# Patient Record
Sex: Female | Born: 1950 | Race: White | Hispanic: No | Smoking: Former smoker
Health system: Southern US, Community
[De-identification: ages and names within clinical notes are randomized; demographics above are authoritative.]

## PROBLEM LIST (undated history)

## (undated) DIAGNOSIS — J302 Other seasonal allergic rhinitis: Secondary | ICD-10-CM

## (undated) DIAGNOSIS — Z5189 Encounter for other specified aftercare: Secondary | ICD-10-CM

## (undated) DIAGNOSIS — N189 Chronic kidney disease, unspecified: Secondary | ICD-10-CM

## (undated) DIAGNOSIS — R000 Tachycardia, unspecified: Secondary | ICD-10-CM

## (undated) DIAGNOSIS — I739 Peripheral vascular disease, unspecified: Secondary | ICD-10-CM

## (undated) DIAGNOSIS — E785 Hyperlipidemia, unspecified: Secondary | ICD-10-CM

## (undated) DIAGNOSIS — R600 Localized edema: Secondary | ICD-10-CM

## (undated) DIAGNOSIS — J189 Pneumonia, unspecified organism: Secondary | ICD-10-CM

## (undated) DIAGNOSIS — D649 Anemia, unspecified: Secondary | ICD-10-CM

## (undated) DIAGNOSIS — M1990 Unspecified osteoarthritis, unspecified site: Secondary | ICD-10-CM

## (undated) HISTORY — PX: ANKLE SURGERY: SHX546

## (undated) HISTORY — DX: Hyperlipidemia, unspecified: E78.5

## (undated) HISTORY — PX: TONSILLECTOMY: SUR1361

## (undated) HISTORY — DX: Encounter for other specified aftercare: Z51.89

## (undated) HISTORY — DX: Tachycardia, unspecified: R00.0

## (undated) HISTORY — PX: TUBAL LIGATION: SHX77

## (undated) HISTORY — PX: CHOLECYSTECTOMY: SHX55

## (undated) HISTORY — PX: CARPAL TUNNEL RELEASE: SHX101

## (undated) HISTORY — PX: VEIN LIGATION AND STRIPPING: SHX2653

## (undated) HISTORY — PX: APPENDECTOMY: SHX54

---

## 2004-06-27 ENCOUNTER — Ambulatory Visit: Payer: Self-pay | Admitting: Family Medicine

## 2004-12-08 ENCOUNTER — Emergency Department: Payer: Self-pay | Admitting: Emergency Medicine

## 2005-06-29 ENCOUNTER — Emergency Department: Payer: Self-pay | Admitting: Emergency Medicine

## 2006-10-16 ENCOUNTER — Emergency Department: Payer: Self-pay

## 2006-10-16 ENCOUNTER — Other Ambulatory Visit: Payer: Self-pay

## 2006-11-05 ENCOUNTER — Inpatient Hospital Stay: Payer: Self-pay | Admitting: Internal Medicine

## 2006-11-05 ENCOUNTER — Other Ambulatory Visit: Payer: Self-pay

## 2008-03-09 ENCOUNTER — Ambulatory Visit: Payer: Self-pay | Admitting: Specialist

## 2008-03-16 ENCOUNTER — Ambulatory Visit: Payer: Self-pay | Admitting: Specialist

## 2008-03-29 ENCOUNTER — Ambulatory Visit: Payer: Self-pay | Admitting: Gastroenterology

## 2008-05-29 ENCOUNTER — Ambulatory Visit: Payer: Self-pay | Admitting: Anesthesiology

## 2008-06-02 ENCOUNTER — Ambulatory Visit: Payer: Self-pay | Admitting: Anesthesiology

## 2008-06-04 ENCOUNTER — Ambulatory Visit: Payer: Self-pay | Admitting: Family Medicine

## 2008-07-07 ENCOUNTER — Ambulatory Visit: Payer: Self-pay | Admitting: Anesthesiology

## 2008-08-07 ENCOUNTER — Ambulatory Visit: Payer: Self-pay | Admitting: Anesthesiology

## 2008-08-23 ENCOUNTER — Ambulatory Visit: Payer: Self-pay | Admitting: Anesthesiology

## 2010-12-23 ENCOUNTER — Observation Stay: Payer: Self-pay | Admitting: Internal Medicine

## 2011-03-19 ENCOUNTER — Ambulatory Visit: Payer: Self-pay | Admitting: Family Medicine

## 2012-03-16 ENCOUNTER — Ambulatory Visit: Payer: Self-pay | Admitting: Anesthesiology

## 2012-04-08 ENCOUNTER — Ambulatory Visit: Payer: Self-pay | Admitting: Anesthesiology

## 2012-06-01 ENCOUNTER — Ambulatory Visit: Payer: Self-pay | Admitting: Anesthesiology

## 2012-07-15 ENCOUNTER — Ambulatory Visit: Payer: Self-pay | Admitting: Anesthesiology

## 2012-08-17 ENCOUNTER — Ambulatory Visit: Payer: Self-pay | Admitting: Anesthesiology

## 2012-09-15 ENCOUNTER — Ambulatory Visit: Payer: Self-pay | Admitting: Anesthesiology

## 2012-11-15 ENCOUNTER — Ambulatory Visit: Payer: Self-pay | Admitting: Anesthesiology

## 2013-02-15 ENCOUNTER — Ambulatory Visit: Payer: Self-pay | Admitting: Anesthesiology

## 2013-05-17 ENCOUNTER — Ambulatory Visit: Payer: Self-pay | Admitting: Anesthesiology

## 2013-07-11 ENCOUNTER — Emergency Department: Payer: Self-pay | Admitting: Emergency Medicine

## 2013-08-18 ENCOUNTER — Ambulatory Visit: Payer: Self-pay | Admitting: Anesthesiology

## 2013-09-13 ENCOUNTER — Ambulatory Visit: Payer: Self-pay | Admitting: Family Medicine

## 2013-09-19 ENCOUNTER — Ambulatory Visit: Payer: Self-pay

## 2013-09-30 ENCOUNTER — Ambulatory Visit: Payer: Self-pay | Admitting: Family Medicine

## 2013-10-26 ENCOUNTER — Emergency Department: Payer: Self-pay | Admitting: Emergency Medicine

## 2013-10-26 LAB — URINALYSIS, COMPLETE
BILIRUBIN, UR: NEGATIVE
Bacteria: NONE SEEN
Glucose,UR: NEGATIVE mg/dL (ref 0–75)
Hyaline Cast: 3
Ketone: NEGATIVE
LEUKOCYTE ESTERASE: NEGATIVE
Nitrite: NEGATIVE
PH: 5 (ref 4.5–8.0)
Protein: NEGATIVE
RBC,UR: NONE SEEN /HPF (ref 0–5)
SPECIFIC GRAVITY: 1.006 (ref 1.003–1.030)
Squamous Epithelial: <1
WBC UR: NONE SEEN /HPF (ref 0–5)

## 2013-10-26 LAB — CBC WITH DIFFERENTIAL/PLATELET
BASOS PCT: 0.3 %
Basophil #: 0.1 10*3/uL (ref 0.0–0.1)
EOS ABS: 0.1 10*3/uL (ref 0.0–0.7)
Eosinophil %: 0.5 %
HCT: 39.6 % (ref 35.0–47.0)
HGB: 13.2 g/dL (ref 12.0–16.0)
Lymphocyte #: 2 10*3/uL (ref 1.0–3.6)
Lymphocyte %: 11.8 %
MCH: 30.5 pg (ref 26.0–34.0)
MCHC: 33.3 g/dL (ref 32.0–36.0)
MCV: 92 fL (ref 80–100)
Monocyte #: 0.9 x10 3/mm (ref 0.2–0.9)
Monocyte %: 5.2 %
NEUTROS PCT: 82.2 %
Neutrophil #: 14.2 10*3/uL — ABNORMAL HIGH (ref 1.4–6.5)
Platelet: 233 10*3/uL (ref 150–440)
RBC: 4.33 10*6/uL (ref 3.80–5.20)
RDW: 13.6 % (ref 11.5–14.5)
WBC: 17.2 10*3/uL — ABNORMAL HIGH (ref 3.6–11.0)

## 2013-10-26 LAB — COMPREHENSIVE METABOLIC PANEL
ALBUMIN: 3.6 g/dL (ref 3.4–5.0)
AST: 25 U/L (ref 15–37)
Alkaline Phosphatase: 78 U/L
Anion Gap: 1 — ABNORMAL LOW (ref 7–16)
BUN: 18 mg/dL (ref 7–18)
Bilirubin,Total: 0.5 mg/dL (ref 0.2–1.0)
CALCIUM: 9.4 mg/dL (ref 8.5–10.1)
CHLORIDE: 105 mmol/L (ref 98–107)
CO2: 31 mmol/L (ref 21–32)
CREATININE: 0.77 mg/dL (ref 0.60–1.30)
EGFR (Non-African Amer.): >60
Glucose: 144 mg/dL — ABNORMAL HIGH (ref 65–99)
Osmolality: 278 (ref 275–301)
POTASSIUM: 3.8 mmol/L (ref 3.5–5.1)
SGPT (ALT): 42 U/L (ref 12–78)
Sodium: 137 mmol/L (ref 136–145)
Total Protein: 6.8 g/dL (ref 6.4–8.2)

## 2013-10-26 LAB — PROTIME-INR
INR: 0.9
Prothrombin Time: 12.1 secs (ref 11.5–14.7)

## 2013-10-26 LAB — APTT: ACTIVATED PTT: 29.1 s (ref 23.6–35.9)

## 2013-11-06 DIAGNOSIS — J189 Pneumonia, unspecified organism: Secondary | ICD-10-CM

## 2013-11-06 HISTORY — DX: Pneumonia, unspecified organism: J18.9

## 2013-11-09 ENCOUNTER — Ambulatory Visit: Payer: Self-pay | Admitting: Anesthesiology

## 2013-11-11 ENCOUNTER — Ambulatory Visit: Payer: Self-pay | Admitting: Family Medicine

## 2013-11-21 ENCOUNTER — Ambulatory Visit: Payer: Self-pay | Admitting: Family Medicine

## 2013-11-22 ENCOUNTER — Inpatient Hospital Stay: Payer: Self-pay | Admitting: Internal Medicine

## 2013-11-22 LAB — HEMOGLOBIN A1C: Hemoglobin A1C: 6.1 % (ref 4.2–6.3)

## 2013-11-22 LAB — CK TOTAL AND CKMB (NOT AT ARMC)
CK, Total: 151 U/L
CK, Total: 147 U/L
CK-MB: 5.7 ng/mL — ABNORMAL HIGH (ref 0.5–3.6)
CK-MB: 5.9 ng/mL — AB (ref 0.5–3.6)

## 2013-11-22 LAB — CK-MB: CK-MB: 5.5 ng/mL — ABNORMAL HIGH (ref 0.5–3.6)

## 2013-11-22 LAB — TROPONIN I
TROPONIN-I: 0.58 ng/mL — AB
Troponin-I: 0.83 ng/mL — ABNORMAL HIGH

## 2013-11-23 LAB — CBC WITH DIFFERENTIAL/PLATELET
BASOS PCT: 0.2 %
Basophil #: 0 10*3/uL (ref 0.0–0.1)
Eosinophil #: 0 10*3/uL (ref 0.0–0.7)
Eosinophil %: 0 %
HCT: 37.5 % (ref 35.0–47.0)
HGB: 12.6 g/dL (ref 12.0–16.0)
LYMPHS ABS: 0.9 10*3/uL — AB (ref 1.0–3.6)
Lymphocyte %: 4.8 %
MCH: 31.3 pg (ref 26.0–34.0)
MCHC: 33.7 g/dL (ref 32.0–36.0)
MCV: 93 fL (ref 80–100)
MONO ABS: 0.6 x10 3/mm (ref 0.2–0.9)
Monocyte %: 3 %
Neutrophil #: 17.3 10*3/uL — ABNORMAL HIGH (ref 1.4–6.5)
Neutrophil %: 92 %
Platelet: 297 10*3/uL (ref 150–440)
RBC: 4.03 10*6/uL (ref 3.80–5.20)
RDW: 15.1 % — ABNORMAL HIGH (ref 11.5–14.5)
WBC: 18.8 10*3/uL — ABNORMAL HIGH (ref 3.6–11.0)

## 2013-11-23 LAB — BASIC METABOLIC PANEL
Anion Gap: 7 (ref 7–16)
BUN: 22 mg/dL — ABNORMAL HIGH (ref 7–18)
CALCIUM: 8.6 mg/dL (ref 8.5–10.1)
CREATININE: 1.52 mg/dL — AB (ref 0.60–1.30)
Chloride: 105 mmol/L (ref 98–107)
Co2: 23 mmol/L (ref 21–32)
EGFR (African American): 42 — ABNORMAL LOW
GFR CALC NON AF AMER: 36 — AB
Glucose: 188 mg/dL — ABNORMAL HIGH (ref 65–99)
Osmolality: 278 (ref 275–301)
POTASSIUM: 4.2 mmol/L (ref 3.5–5.1)
SODIUM: 135 mmol/L — AB (ref 136–145)

## 2013-11-23 LAB — MAGNESIUM: Magnesium: 2 mg/dL

## 2013-11-23 LAB — TSH: Thyroid Stimulating Horm: 0.402 u[IU]/mL — ABNORMAL LOW

## 2013-11-23 LAB — PHOSPHORUS: PHOSPHORUS: 3.5 mg/dL (ref 2.5–4.9)

## 2013-11-24 LAB — BASIC METABOLIC PANEL
ANION GAP: 4 — AB (ref 7–16)
BUN: 31 mg/dL — ABNORMAL HIGH (ref 7–18)
CALCIUM: 8.8 mg/dL (ref 8.5–10.1)
Chloride: 105 mmol/L (ref 98–107)
Co2: 27 mmol/L (ref 21–32)
Creatinine: 1.03 mg/dL (ref 0.60–1.30)
EGFR (African American): >60
GFR CALC NON AF AMER: 57 — AB
Glucose: 192 mg/dL — ABNORMAL HIGH (ref 65–99)
Osmolality: 284 (ref 275–301)
POTASSIUM: 4.2 mmol/L (ref 3.5–5.1)
Sodium: 136 mmol/L (ref 136–145)

## 2013-11-24 LAB — CBC WITH DIFFERENTIAL/PLATELET
Basophil #: 0 10*3/uL (ref 0.0–0.1)
Basophil %: 0.3 %
EOS ABS: 0 10*3/uL (ref 0.0–0.7)
EOS PCT: 0 %
HCT: 35.3 % (ref 35.0–47.0)
HGB: 11.7 g/dL — ABNORMAL LOW (ref 12.0–16.0)
Lymphocyte #: 0.9 10*3/uL — ABNORMAL LOW (ref 1.0–3.6)
Lymphocyte %: 5.7 %
MCH: 30.9 pg (ref 26.0–34.0)
MCHC: 33.2 g/dL (ref 32.0–36.0)
MCV: 93 fL (ref 80–100)
MONOS PCT: 6.1 %
Monocyte #: 1 x10 3/mm — ABNORMAL HIGH (ref 0.2–0.9)
NEUTROS ABS: 13.8 10*3/uL — AB (ref 1.4–6.5)
NEUTROS PCT: 87.9 %
Platelet: 232 10*3/uL (ref 150–440)
RBC: 3.79 10*6/uL — AB (ref 3.80–5.20)
RDW: 15.5 % — ABNORMAL HIGH (ref 11.5–14.5)
WBC: 15.7 10*3/uL — AB (ref 3.6–11.0)

## 2013-11-25 LAB — BASIC METABOLIC PANEL
Anion Gap: 4 — ABNORMAL LOW (ref 7–16)
Anion Gap: 1 — ABNORMAL LOW (ref 7–16)
BUN: 43 mg/dL — ABNORMAL HIGH (ref 7–18)
BUN: 39 mg/dL — ABNORMAL HIGH (ref 7–18)
CHLORIDE: 109 mmol/L — AB (ref 98–107)
CO2: 28 mmol/L (ref 21–32)
Calcium, Total: 8.5 mg/dL (ref 8.5–10.1)
Calcium, Total: 8.4 mg/dL — ABNORMAL LOW (ref 8.5–10.1)
Chloride: 108 mmol/L — ABNORMAL HIGH (ref 98–107)
Co2: 28 mmol/L (ref 21–32)
Creatinine: 1.08 mg/dL (ref 0.60–1.30)
Creatinine: 0.98 mg/dL (ref 0.60–1.30)
EGFR (African American): >60
EGFR (Non-African Amer.): 54 — ABNORMAL LOW
Glucose: 155 mg/dL — ABNORMAL HIGH (ref 65–99)
Glucose: 187 mg/dL — ABNORMAL HIGH (ref 65–99)
OSMOLALITY: 293 (ref 275–301)
Osmolality: 290 (ref 275–301)
Potassium: 5.5 mmol/L — ABNORMAL HIGH (ref 3.5–5.1)
Potassium: 5.1 mmol/L (ref 3.5–5.1)
Sodium: 140 mmol/L (ref 136–145)
Sodium: 138 mmol/L (ref 136–145)

## 2013-11-25 LAB — MAGNESIUM: MAGNESIUM: 2.5 mg/dL — AB

## 2013-11-25 LAB — PHOSPHORUS: Phosphorus: 5.1 mg/dL — ABNORMAL HIGH (ref 2.5–4.9)

## 2013-11-26 LAB — CBC WITH DIFFERENTIAL/PLATELET
Bands: 2 %
HCT: 38 % (ref 35.0–47.0)
HGB: 12.4 g/dL (ref 12.0–16.0)
Lymphocytes: 8 %
MCH: 30.5 pg (ref 26.0–34.0)
MCHC: 32.6 g/dL (ref 32.0–36.0)
MCV: 93 fL (ref 80–100)
MONOS PCT: 5 %
Metamyelocyte: 2 %
Myelocyte: 2 %
Platelet: 222 10*3/uL (ref 150–440)
RBC: 4.07 10*6/uL (ref 3.80–5.20)
RDW: 14.8 % — AB (ref 11.5–14.5)
SEGMENTED NEUTROPHILS: 77 %
Variant Lymphocyte - H1-Rlymph: 4 %
WBC: 13.8 10*3/uL — ABNORMAL HIGH (ref 3.6–11.0)

## 2013-11-26 LAB — BASIC METABOLIC PANEL
ANION GAP: 1 — AB (ref 7–16)
BUN: 36 mg/dL — ABNORMAL HIGH (ref 7–18)
Calcium, Total: 8.5 mg/dL (ref 8.5–10.1)
Chloride: 107 mmol/L (ref 98–107)
Co2: 31 mmol/L (ref 21–32)
Creatinine: 0.72 mg/dL (ref 0.60–1.30)
EGFR (African American): >60
EGFR (Non-African Amer.): >60
GLUCOSE: 193 mg/dL — AB (ref 65–99)
Osmolality: 291 (ref 275–301)
POTASSIUM: 5.1 mmol/L (ref 3.5–5.1)
Sodium: 139 mmol/L (ref 136–145)

## 2013-11-26 LAB — MAGNESIUM: Magnesium: 2.1 mg/dL

## 2013-11-26 LAB — PHOSPHORUS: PHOSPHORUS: 2.9 mg/dL (ref 2.5–4.9)

## 2013-11-27 LAB — BASIC METABOLIC PANEL
Anion Gap: 5 — ABNORMAL LOW (ref 7–16)
BUN: 25 mg/dL — ABNORMAL HIGH (ref 7–18)
Calcium, Total: 8.1 mg/dL — ABNORMAL LOW (ref 8.5–10.1)
Chloride: 99 mmol/L (ref 98–107)
Co2: 31 mmol/L (ref 21–32)
Creatinine: 0.75 mg/dL (ref 0.60–1.30)
EGFR (African American): >60
EGFR (Non-African Amer.): >60
GLUCOSE: 184 mg/dL — AB (ref 65–99)
Osmolality: 279 (ref 275–301)
Potassium: 4.8 mmol/L (ref 3.5–5.1)
Sodium: 135 mmol/L — ABNORMAL LOW (ref 136–145)

## 2013-11-27 LAB — URINE CULTURE

## 2013-11-27 LAB — CBC WITH DIFFERENTIAL/PLATELET
BASOS ABS: 0.1 10*3/uL (ref 0.0–0.1)
BASOS PCT: 0.7 %
EOS ABS: 0 10*3/uL (ref 0.0–0.7)
Eosinophil %: 0 %
HCT: 36.7 % (ref 35.0–47.0)
HGB: 12.2 g/dL (ref 12.0–16.0)
LYMPHS PCT: 9.1 %
Lymphocyte #: 1.5 10*3/uL (ref 1.0–3.6)
MCH: 30.8 pg (ref 26.0–34.0)
MCHC: 33.1 g/dL (ref 32.0–36.0)
MCV: 93 fL (ref 80–100)
Monocyte #: 1.5 x10 3/mm — ABNORMAL HIGH (ref 0.2–0.9)
Monocyte %: 9.1 %
Neutrophil #: 13.1 10*3/uL — ABNORMAL HIGH (ref 1.4–6.5)
Neutrophil %: 81.1 %
Platelet: 192 10*3/uL (ref 150–440)
RBC: 3.95 10*6/uL (ref 3.80–5.20)
RDW: 14.8 % — ABNORMAL HIGH (ref 11.5–14.5)
WBC: 16.1 10*3/uL — AB (ref 3.6–11.0)

## 2013-11-27 LAB — CULTURE, BLOOD (SINGLE)

## 2013-11-27 LAB — MAGNESIUM: MAGNESIUM: 1.7 mg/dL — AB

## 2013-11-27 LAB — EXPECTORATED SPUTUM ASSESSMENT W GRAM STAIN, RFLX TO RESP C

## 2013-11-27 LAB — PHOSPHORUS: Phosphorus: 3.1 mg/dL (ref 2.5–4.9)

## 2013-11-28 LAB — CBC WITH DIFFERENTIAL/PLATELET
BANDS NEUTROPHIL: 1 %
Eosinophil: 1 %
HCT: 38.1 % (ref 35.0–47.0)
HGB: 12.4 g/dL (ref 12.0–16.0)
Lymphocytes: 6 %
MCH: 30.3 pg (ref 26.0–34.0)
MCHC: 32.6 g/dL (ref 32.0–36.0)
MCV: 93 fL (ref 80–100)
METAMYELOCYTE: 4 %
Monocytes: 7 %
Myelocyte: 1 %
Platelet: 187 10*3/uL (ref 150–440)
RBC: 4.1 10*6/uL (ref 3.80–5.20)
RDW: 14.9 % — ABNORMAL HIGH (ref 11.5–14.5)
Segmented Neutrophils: 80 %
WBC: 21.1 10*3/uL — AB (ref 3.6–11.0)

## 2013-11-28 LAB — BASIC METABOLIC PANEL
ANION GAP: 0 — AB (ref 7–16)
BUN: 26 mg/dL — AB (ref 7–18)
CALCIUM: 7.9 mg/dL — AB (ref 8.5–10.1)
CREATININE: 0.62 mg/dL (ref 0.60–1.30)
Chloride: 99 mmol/L (ref 98–107)
Co2: 33 mmol/L — ABNORMAL HIGH (ref 21–32)
EGFR (African American): >60
GLUCOSE: 180 mg/dL — AB (ref 65–99)
OSMOLALITY: 274 (ref 275–301)
POTASSIUM: 4.4 mmol/L (ref 3.5–5.1)
SODIUM: 132 mmol/L — AB (ref 136–145)

## 2013-11-29 LAB — PHOSPHORUS: Phosphorus: 2.8 mg/dL (ref 2.5–4.9)

## 2013-11-29 LAB — BASIC METABOLIC PANEL
Anion Gap: 2 — ABNORMAL LOW (ref 7–16)
BUN: 24 mg/dL — AB (ref 7–18)
CHLORIDE: 99 mmol/L (ref 98–107)
CO2: 35 mmol/L — AB (ref 21–32)
Calcium, Total: 7.9 mg/dL — ABNORMAL LOW (ref 8.5–10.1)
Creatinine: 0.54 mg/dL — ABNORMAL LOW (ref 0.60–1.30)
GLUCOSE: 83 mg/dL (ref 65–99)
OSMOLALITY: 275 (ref 275–301)
POTASSIUM: 3.4 mmol/L — AB (ref 3.5–5.1)
Sodium: 136 mmol/L (ref 136–145)

## 2013-11-29 LAB — MAGNESIUM: Magnesium: 1.8 mg/dL

## 2013-11-29 LAB — ALBUMIN: Albumin: 2.3 g/dL — ABNORMAL LOW (ref 3.4–5.0)

## 2013-11-29 LAB — CLOSTRIDIUM DIFFICILE(ARMC)

## 2013-11-30 LAB — BASIC METABOLIC PANEL
Anion Gap: 4 — ABNORMAL LOW (ref 7–16)
BUN: 19 mg/dL — ABNORMAL HIGH (ref 7–18)
Calcium, Total: 8.1 mg/dL — ABNORMAL LOW (ref 8.5–10.1)
Chloride: 99 mmol/L (ref 98–107)
Co2: 36 mmol/L — ABNORMAL HIGH (ref 21–32)
Creatinine: 0.53 mg/dL — ABNORMAL LOW (ref 0.60–1.30)
EGFR (African American): >60
EGFR (Non-African Amer.): >60
GLUCOSE: 85 mg/dL (ref 65–99)
Osmolality: 279 (ref 275–301)
POTASSIUM: 3.2 mmol/L — AB (ref 3.5–5.1)
Sodium: 139 mmol/L (ref 136–145)

## 2013-12-01 LAB — BASIC METABOLIC PANEL
Anion Gap: 6 — ABNORMAL LOW (ref 7–16)
BUN: 17 mg/dL (ref 7–18)
CALCIUM: 8.2 mg/dL — AB (ref 8.5–10.1)
Chloride: 102 mmol/L (ref 98–107)
Co2: 34 mmol/L — ABNORMAL HIGH (ref 21–32)
Creatinine: 0.54 mg/dL — ABNORMAL LOW (ref 0.60–1.30)
EGFR (Non-African Amer.): >60
GLUCOSE: 84 mg/dL (ref 65–99)
Osmolality: 284 (ref 275–301)
Sodium: 142 mmol/L (ref 136–145)

## 2013-12-01 LAB — POTASSIUM: POTASSIUM: 3.2 mmol/L — AB (ref 3.5–5.1)

## 2013-12-02 LAB — PLATELET COUNT: Platelet: 165 10*3/uL (ref 150–440)

## 2013-12-02 LAB — BASIC METABOLIC PANEL
ANION GAP: 5 — AB (ref 7–16)
BUN: 17 mg/dL (ref 7–18)
CHLORIDE: 103 mmol/L (ref 98–107)
Calcium, Total: 8.7 mg/dL (ref 8.5–10.1)
Co2: 31 mmol/L (ref 21–32)
Creatinine: 0.59 mg/dL — ABNORMAL LOW (ref 0.60–1.30)
EGFR (African American): >60
EGFR (Non-African Amer.): >60
Glucose: 63 mg/dL — ABNORMAL LOW (ref 65–99)
Osmolality: 277 (ref 275–301)
Potassium: 3.7 mmol/L (ref 3.5–5.1)
SODIUM: 139 mmol/L (ref 136–145)

## 2013-12-02 LAB — CULTURE, BLOOD (SINGLE)

## 2014-01-05 ENCOUNTER — Ambulatory Visit: Payer: Self-pay | Admitting: Anesthesiology

## 2014-03-15 ENCOUNTER — Ambulatory Visit: Payer: Self-pay | Admitting: Anesthesiology

## 2014-04-14 ENCOUNTER — Ambulatory Visit: Payer: Self-pay | Admitting: Anesthesiology

## 2014-05-16 ENCOUNTER — Ambulatory Visit: Payer: Self-pay | Admitting: Anesthesiology

## 2014-06-09 IMAGING — CR DG CHEST 1V PORT
1 series · 1 of 1 positions shown · non-contrast
Comparison: 11/24/2013

CLINICAL DATA: Hypoxia, chronic cough

EXAM:
PORTABLE CHEST - 1 VIEW

[ap]
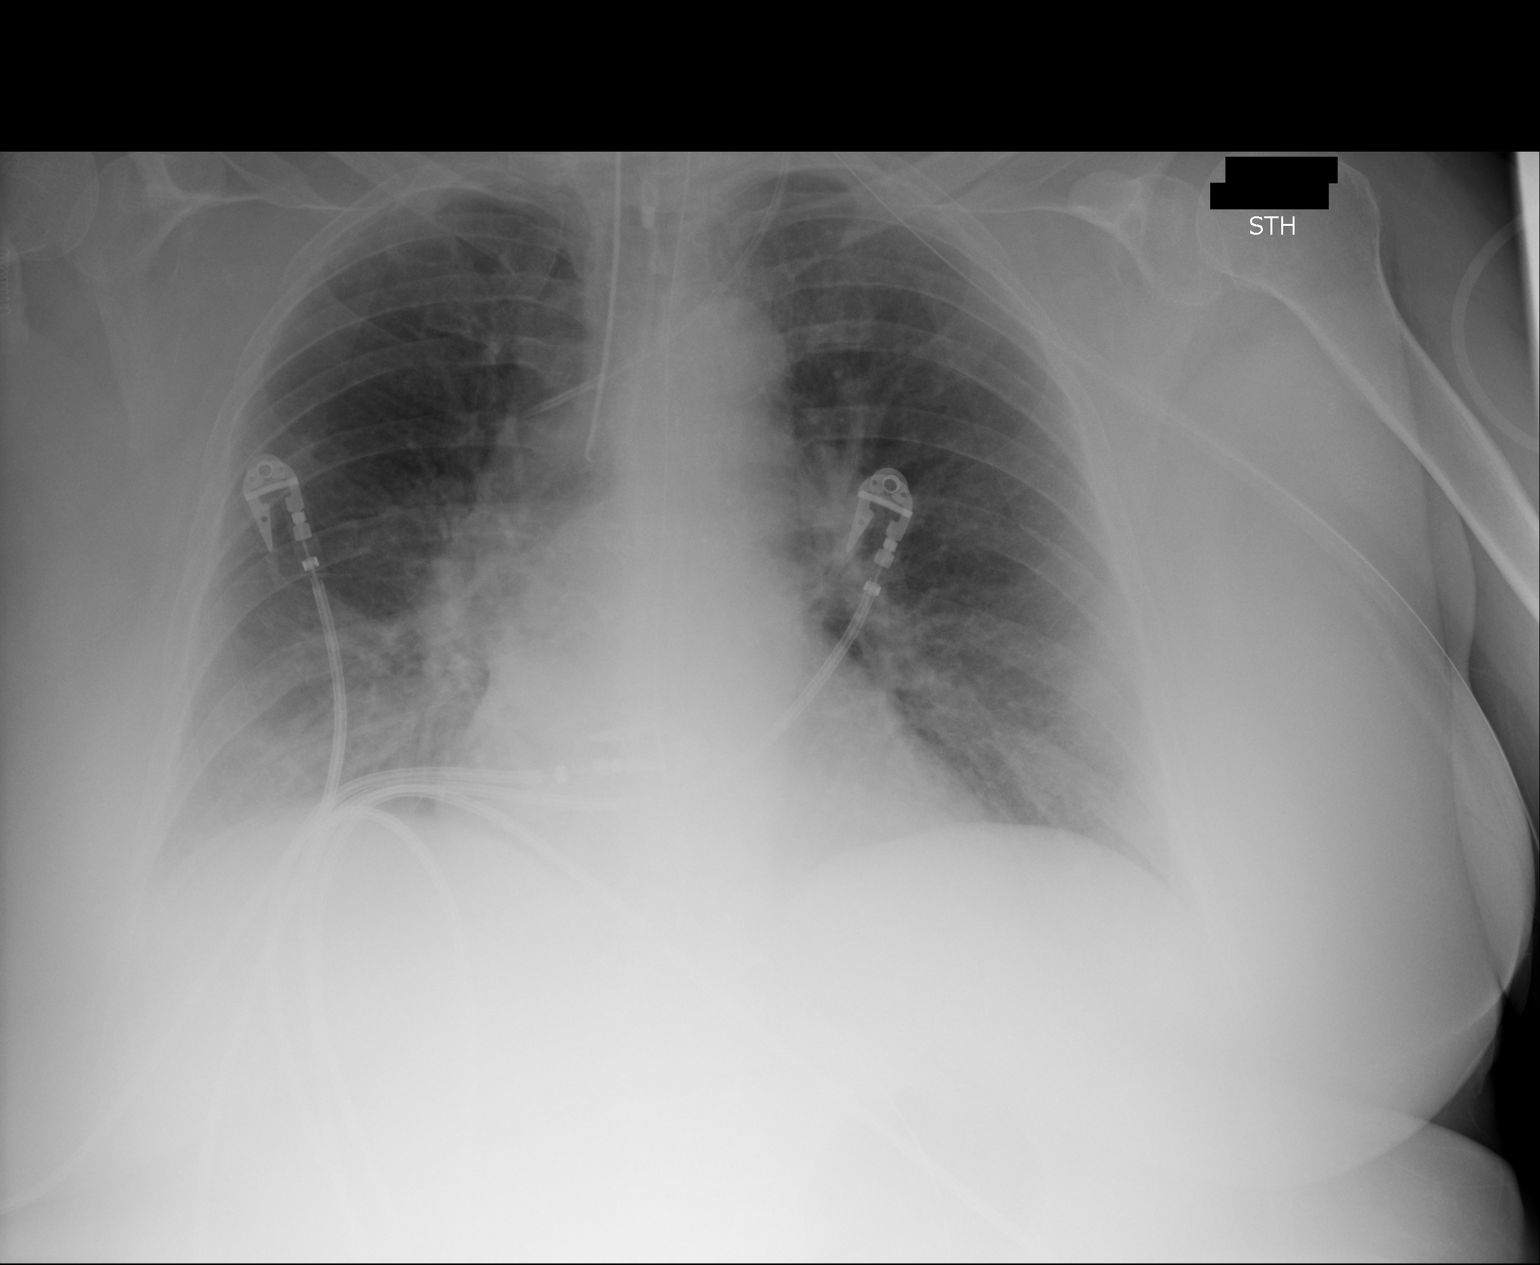

[1 of 1 positions shown; findings below may reference images not displayed]

FINDINGS: Endotracheal tube terminates 8 mm above the carina with preferential
intubation of the right mainstem bronchus.

Low lung volumes with vascular crowding. Possible mild patchy right
lower lobe opacity, atelectasis versus pneumonia. No pleural
effusion or pneumothorax.

The heart is normal in size.

Stable left IJ venous catheter in the mid SVC.

Enteric tube courses into the stomach.
IMPRESSION: Endotracheal tube terminates 8 mm above the carina with preferential
intubation of the right mainstem bronchus.

Possible mild patchy right lower lobe opacity, atelectasis versus
pneumonia.

## 2014-06-12 IMAGING — CR DG ABDOMEN 1V
1 series · 2 of 2 positions shown · non-contrast
Comparison: CT ABD-PELV W/ CM dated 11/11/2013

CLINICAL DATA: Nausea and vomiting and diarrhea ; question small
bowel obstruction.

EXAM:
ABDOMEN - 1 VIEW

[Series 1: supine kub · 0.17mm/px · 2 of 2 slices shown]
[im 1/2]
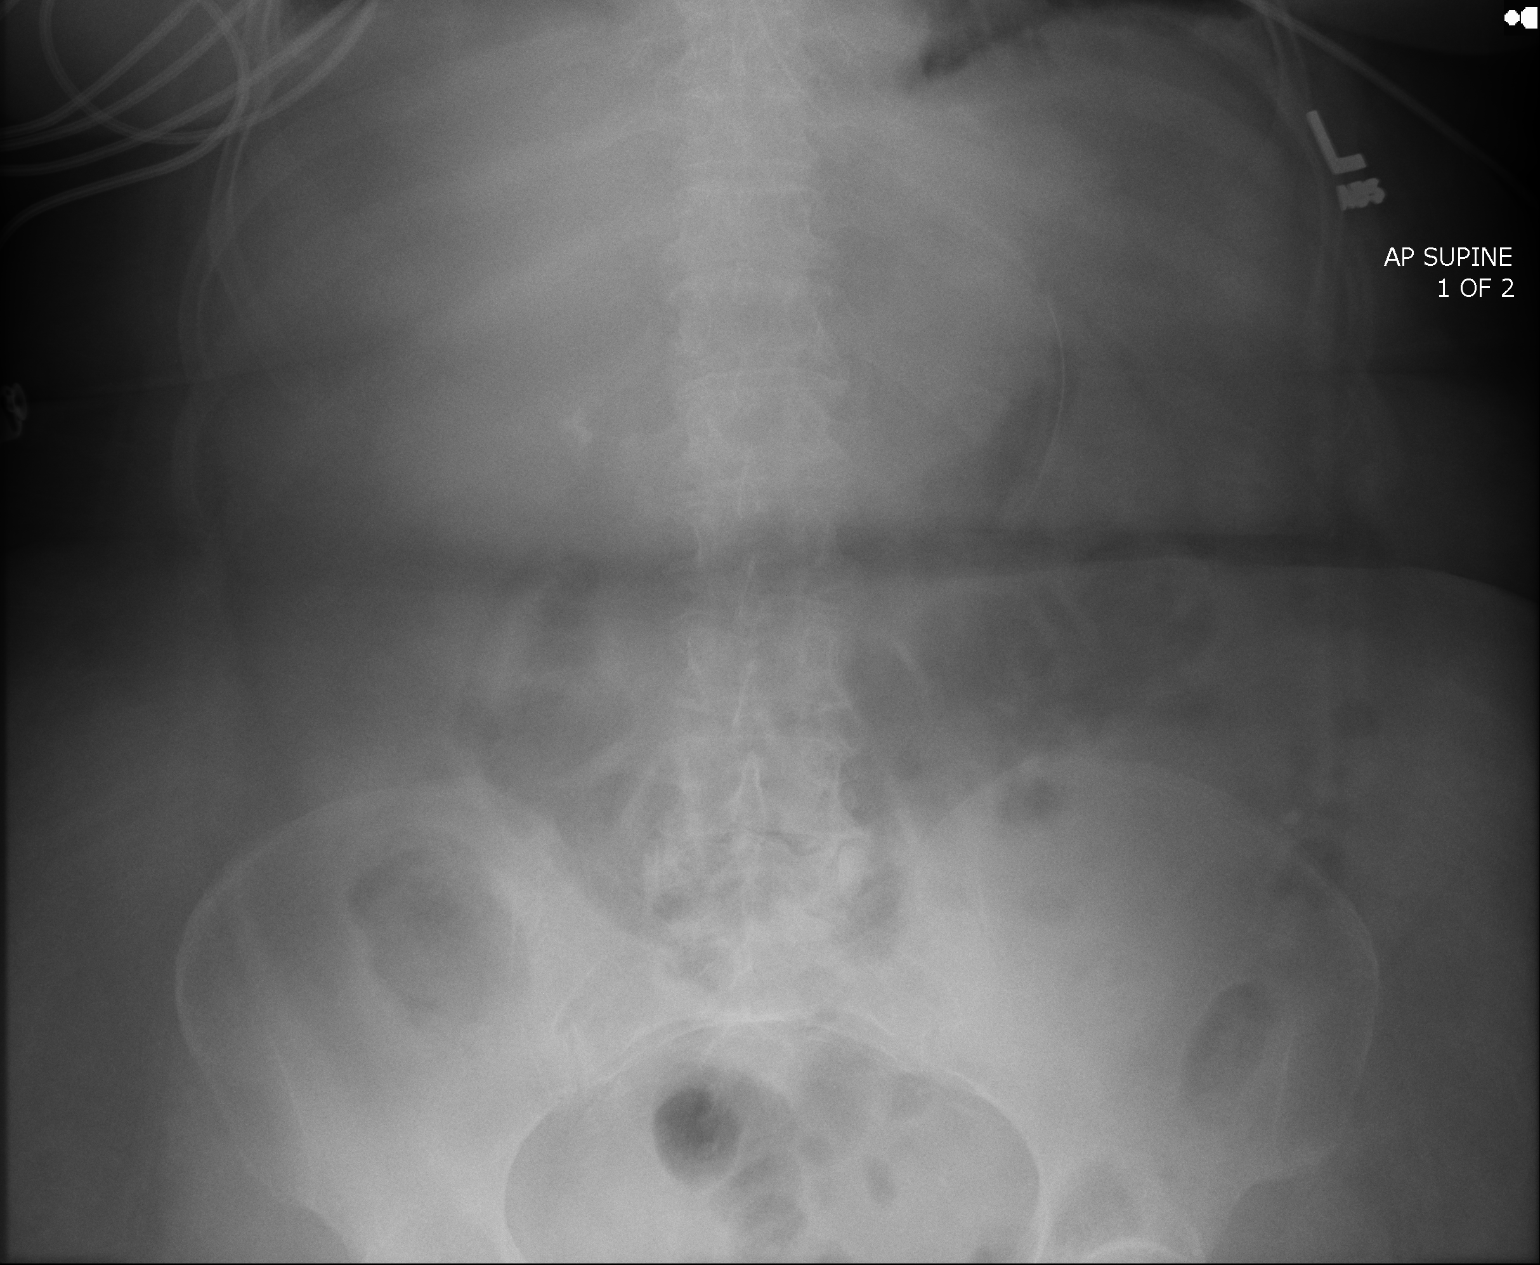
[im 2/2]
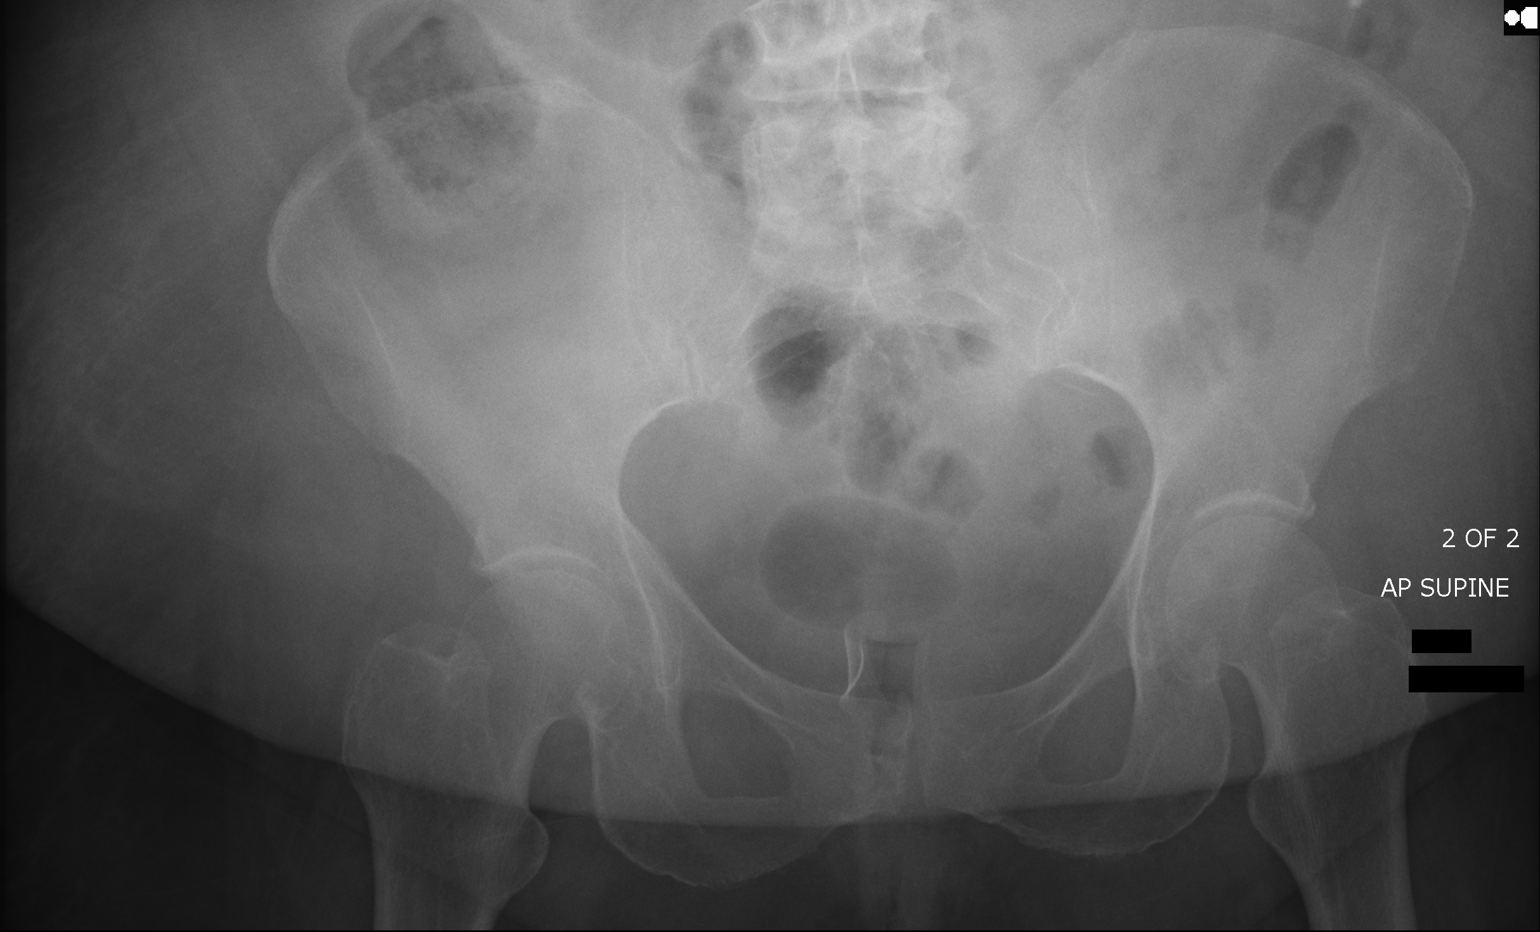

[2 of 2 positions shown; findings below may reference images not displayed]

FINDINGS: The bowel gas pattern is nonspecific. There is a loop of mildly
distended gas-filled bowel over the mid abdomen. There is gas in the
descending colon and rectosigmoid. The esophagogastric tube tip and
proximal port lie well below the expected location of the GE
junction. There are surgical clips in the gallbladder fossa. There
are degenerative changes of the lower lumbar spine.
IMPRESSION: The bowel gas pattern is nonspecific. There is no definite evidence
of obstruction

## 2014-08-09 ENCOUNTER — Ambulatory Visit: Payer: Self-pay | Admitting: Anesthesiology

## 2014-09-27 ENCOUNTER — Emergency Department: Payer: Self-pay | Admitting: Emergency Medicine

## 2014-09-27 LAB — CBC WITH DIFFERENTIAL/PLATELET
BASOS ABS: 0.1 10*3/uL (ref 0.0–0.1)
Basophil %: 0.7 %
Eosinophil #: 0.2 10*3/uL (ref 0.0–0.7)
Eosinophil %: 1.8 %
HCT: 51 % — AB (ref 35.0–47.0)
HGB: 16.5 g/dL — ABNORMAL HIGH (ref 12.0–16.0)
LYMPHS ABS: 1.9 10*3/uL (ref 1.0–3.6)
Lymphocyte %: 14.1 %
MCH: 30.1 pg (ref 26.0–34.0)
MCHC: 32.3 g/dL (ref 32.0–36.0)
MCV: 93 fL (ref 80–100)
MONO ABS: 0.7 x10 3/mm (ref 0.2–0.9)
MONOS PCT: 5 %
Neutrophil #: 10.6 10*3/uL — ABNORMAL HIGH (ref 1.4–6.5)
Neutrophil %: 78.4 %
Platelet: 259 10*3/uL (ref 150–440)
RBC: 5.46 10*6/uL — ABNORMAL HIGH (ref 3.80–5.20)
RDW: 13.5 % (ref 11.5–14.5)
WBC: 13.5 10*3/uL — ABNORMAL HIGH (ref 3.6–11.0)

## 2014-09-27 LAB — URINALYSIS, COMPLETE
Bacteria: NONE SEEN
GLUCOSE, UR: NEGATIVE mg/dL (ref 0–75)
KETONE: NEGATIVE
Leukocyte Esterase: NEGATIVE
Nitrite: NEGATIVE
PH: 5 (ref 4.5–8.0)
Protein: 100
RBC,UR: 11 /HPF (ref 0–5)
Specific Gravity: 1.025 (ref 1.003–1.030)
Squamous Epithelial: 4
WBC UR: 6 /HPF (ref 0–5)

## 2014-09-27 LAB — COMPREHENSIVE METABOLIC PANEL
ANION GAP: 8 (ref 7–16)
Albumin: 3.5 g/dL (ref 3.4–5.0)
Alkaline Phosphatase: 91 U/L
BUN: 14 mg/dL (ref 7–18)
Bilirubin,Total: 0.4 mg/dL (ref 0.2–1.0)
CREATININE: 0.9 mg/dL (ref 0.60–1.30)
Calcium, Total: 9.1 mg/dL (ref 8.5–10.1)
Chloride: 106 mmol/L (ref 98–107)
Co2: 26 mmol/L (ref 21–32)
EGFR (Non-African Amer.): >60
Glucose: 150 mg/dL — ABNORMAL HIGH (ref 65–99)
Osmolality: 283 (ref 275–301)
Potassium: 4.6 mmol/L (ref 3.5–5.1)
SGOT(AST): 59 U/L — ABNORMAL HIGH (ref 15–37)
SGPT (ALT): 48 U/L
SODIUM: 140 mmol/L (ref 136–145)
Total Protein: 7.6 g/dL (ref 6.4–8.2)

## 2014-09-27 LAB — TROPONIN I

## 2014-10-09 ENCOUNTER — Ambulatory Visit: Payer: Self-pay | Admitting: Anesthesiology

## 2014-12-27 ENCOUNTER — Ambulatory Visit
Admit: 2014-12-27 | Disposition: A | Discharge status: Home / Self Care | Payer: Self-pay | Attending: Anesthesiology | Admitting: Anesthesiology

## 2014-12-30 NOTE — Consult Note (Signed)
PATIENT NAME:  Madison [name], Madison [name] MR#:  [id number] DATE OF BIRTH:  [birth year]  DATE OF CONSULTATION:  10/26/2013  CONSULTING PHYSICIAN:  Cristal Deer A. Barron Vanloan, MD  REASON FOR CONSULTATION: Left flank hematoma and subcu bruising.   HISTORY OF PRESENT ILLNESS: Madison [name] is a pleasant 64 year old female who has had been treated for the last month and a half for various pulmonary issues which resulted in a frequent and sometimes strong cough. She says that she has been on multiple rounds of antibiotics and has been on prednisone recently.   According to her, approximately 4 days ago, she was coughing violently and pinched her abdominal wall to assist with her coughing and noticed bruising at her pinching point. She also developed significant ecchymoses and also a knot on her left flank. It is tender but bearable. She thought it was actually getting larger. She presented to the ED today. Otherwise has been doing fine except for the cough. Does not have any skin redness of the wound or warmth and no skin changes. No fevers, chills, night sweats, shortness of breath, chest pain, abdominal pain except for the soft-tissue pain, nausea, vomiting, diarrhea, constipation, dysuria or hematuria.   PAST MEDICAL HISTORY:  1.  ? COPD. 2.  History of osteopenia, osteoporosis.  3.  Peripheral vascular disease.  4.  Nephrolithiasis.  5.  Chronic back pain for which she sees Dr. Pernell Dupre.  6.  Allergic rhinitis.  7.  Hypercholesterolemia.  8.  Ovarian cysts.  9.  History of vein stripping with ligation.  10.  History of CTR, left.  11.  History of navicular removal.  12.  History of tubal ligation.  13.  History of tonsillectomy and adenoidectomy. 14.  History of cholecystectomy.   HOME MEDICATIONS: Vitamin B12, potassium chloride, pantaday ophthalmologic solution, naproxen, methylprednisone, methocarbamol, loratadine, furosemide, fluticasone, duloxetine, doxycycline, dextromethorphan, Keflex,benzonatate,  atorvastatin, albuterol, ibuprofen, and Norco for back pain.   ALLERGIES: ALBUMIN, EGGS, FLEXERIL, SULFA, PENICILLIN AND MORPHINE.   SOCIAL HISTORY: Lives in [place] with her 2 daughters who are CNAs, is an Charity fundraiser. History of tobacco use which she quit on January 5. No active alcohol or drug use.   FAMILY HISTORY: Unremarkable.   REVIEW OF SYSTEMS: A 12-point review of systems is obtained. Pertinent positives and negatives as above.   PHYSICAL EXAMINATION:  VITAL SIGNS: Temperature 97.9, pulse 77, blood pressure 155/71, respirations 20, 94% on room air.  GENERAL: No acute distress, alert and oriented x3. HEAD: Normocephalic, atraumatic. Pupils equal and react to light. No scleral icterus. No conjunctivitis. Face: No obvious facial trauma. Normal external nose. Normal external ears.  CHEST: Lungs clear to auscultation. Moving air well. HEART: Regular rate and rhythm. No murmurs, rubs, or gallops.  ABDOMEN: Soft, large ecchymotic bruise over lower abdomen and a palpable subcu mass which is deep on her left side. No obvious skin breakdown. No erythema. No induration.  EXTREMITIES: Moves all extremities well. Strength 5/5.  NEUROLOGIC: Cranial nerves II through XII grossly intact. Sensation intact in all four extremities.   LABORATORY DATA: White cell count of 17.2, hemoglobin 13.2, hematocrit 39.6, platelets at 233. INR 0.9. Urinalysis negative. CMP negative.   CT scan shows abdominal wall soft-tissue stranding as well as a 9 x 5 x 6 hematoma involving the left abdominal wall, appeared to be intramuscular.   ASSESSMENT AND PLAN: Madison [name] is a pleasant 64 year old who appears to have an abdominal hematoma and soft-tissue ecchymoses following a severe coughing spell, likely consistent with violent coughing. No  active extravasation, is hemodynamically stable. Hemoglobin is stable.   I have spoken with her and have offered admission if she is concerned about its increasing size. Otherwise, she  is very reliable and lives with her 2 daughters who are also in the nursing field.   She is to return to office next week for followup. She is to refrain from naproxen but to substitute with Norco for pain for which she is to call Dr. Jarvis MorganAdam's office and let him know; in addition, she is to call hospital and contact me personally or call or return to ER if it feels like that pain is worsening or there are signs or symptoms of infection or skin breakdown or enlarging of hematoma. I spoke with Dr. York CeriseForbach regarding this plan and the patient agrees with this plan.   ____________________________ Si Raiderhristopher A. Deandria Klute, MD cal:np D: 10/26/2013 18:05:59 ET T: 10/26/2013 19:39:06 ET JOB#: [id number]  cc: Cristal Deerhristopher A. Kaliyan Osbourn, MD, <Dictator> Jarvis NewcomerHRISTOPHER A Feliciana Narayan MD ELECTRONICALLY SIGNED 10/27/2013 9:37

## 2014-12-30 NOTE — Op Note (Signed)
PATIENT NAME:  [place] [name], Madison [name] MR#:  [id number] DATE OF BIRTH:  December 19, 1950  DATE OF PROCEDURE:  11/30/2013  PREOPERATIVE DIAGNOSIS: MRSA pneumonia.   POSTOPERATIVE DIAGNOSIS: MRSA pneumonia.   PROCEDURE: Left basilic vein #3 triple-lumen PICC line, left arm.   SURGEON: Levora DredgeGregory Uriah Trueba, M.D.   ESTIMATED BLOOD LOSS: Minimal.   INDICATIONS FOR PROCEDURE:  1.  Requiring IV antibiotics greater than 5 days.  2.  Patient requiring vesicant medications to sustain life.   DESCRIPTION OF PROCEDURE: The patient'[name] left arm was sterilely prepped and draped and a sterile surgical field was created. The basilic vein was accessed under direct ultrasound guidance without difficulty with a micropuncture needle and permanent image was recorded. 0.018 wire was then placed into the superior vena cava. Peel-away sheath was placed over the wire. A single lumen peripherally inserted central venous catheter was then placed over the wire and the wire and peel-away sheath were removed. The catheter tip was placed into the superior vena cava and was secured at the skin at 30 cm with a sterile dressing. The catheter withdrew blood well and flushed easily with heparinized saline. The patient tolerated procedure well. ____________________________ Renford DillsGregory G. Loriann Bosserman, MD ggs:sw D: 11/30/2013 18:57:19 ET T: 12/01/2013 02:40:19 ET JOB#: [id number]  cc: Renford DillsGregory G. Viana Sleep, MD, <Dictator> Renford DillsGREGORY G Jacobus Colvin MD ELECTRONICALLY SIGNED 12/02/2013 17:25

## 2014-12-30 NOTE — H&P (Signed)
[name], Madison [name] MR#:  [id number] DATE OF BIRTH:  June 07, 1951  DATE OF ADMISSION:  11/22/2013  PRIMARY CARE PHYSICIAN:  [facility], Dr. Cay SchillingsJack [name].   CHIEF COMPLAINT: Severe respiratory distress requiring intubation.   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 Respiratory was labored. Madison [name] was put on Madison nonrebreather, brought to [facility] Emergency [facility] where Madison [name] was into impending respiratory failure, got intubated, placed on Madison ventilator.  Madison [name] is currently on fentanyl drip. Madison [name] received IV vancomycin. Levaquin has been added by me. Madison [name] is being admitted for further evaluation and management. Madison [name]'[name] daughter, who is present in [facility] Emergency [facility], provides some of Madison history to me. Madison [name] has been having respiratory infection since January; completed Madison couple of rounds of antibiotics. Continued to have this shortness of breath along with history of smoking, although Madison [name] did cut back on her smoking, per daughter. Madison [name] had developed significant abdominal wall hematoma secondary to her severe bouts of coughing. Her hematoma has resolved. Madison [name] is currently not on any antibiotics. Madison [name] was seen at urgent care several times during Madison wintertime.   PAST MEDICAL HISTORY: 1.  Chronic back pain, follows up with Dr. Pernell [name] at pain clinic.  2.  Allergic rhinitis.  3.  Ovarian cyst.  4.  Hypercholesterolemia.  5.  History of vein stripping with ligation.  6.  History of navicular bone removal, left DJD.  7.  History of tubal ligation.  8.  Tonsillectomy and adenoidectomy.  9.  Cholecystectomy.  10.  History of nephrolithiasis.  11.  History of COPD  with ongoing tobacco abuse. Per daughter, Madison [name] has quit for Madison last few weeks.   ALLERGIES:  ALBUMIN, EGGS, FLEXERIL, SULFA DRUGS, PENICILLIN AND MORPHINE.   CURRENT MEDICATIONS: 1.  Spiriva 18 mcg inhalation daily.  2.  Pulmicort 1 puff b.i.d.  3.  Promethazine 5 to 10 mL orally every 4 hours as needed.  4.  Potassium chloride 10 mEq 3 times Madison day.  5.  Naproxen 500 mg b.i.d.  6.  Loratadine/pseudoephedrine 5/120 mg extended-release b.i.d.  7.  Lasix 20 mg once Madison day as needed for leg edema. Per daughter, Madison [name]'[name] Lasix has been increased to 60 mg daily by Dr. Sheppard [name] for Madison last few days since her leg swelling has worsened.  8.  Duloxetine 60 mg p.o. daily.  9.  Carisoprodol 350 mg b.i.d.  10.  Atorvastatin 10 mg daily.  11.  Albuterol/ipratropium 3 inhalations every 4 hours as needed.  12.  Acetaminophen/hydrocodone (5/325mg ) 1 tablet 3 times Madison day. This was prescribed through [place] pain clinic.   REVIEW OF SYSTEMS:  Unobtainable. Madison [name] intubated on Madison vent.   FAMILY HISTORY: Unobtainable. Madison [name] intubated on Madison vent.   SOCIAL HISTORY: Lives at home. 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 Madison [name] quit smoking Madison few weeks ago, per daughter. No other drug use.   PHYSICAL EXAMINATION: VITAL SIGNS: Madison [name] appears critically ill, intubated on Madison vent. Afebrile. Pulse is 118, regular. Blood pressure is 140/87. Sats are 95% on current vent setting.  GENERAL:  Morbidly obese, not in acute distress.  HEENT:  Atraumatic, normocephalic. Pupils: PERRLA. EOM intact. Oral mucosa is moist. Madison [name] is intubated on Madison vent with NG and endotracheal tube.  NECK: Supple. No JVD. No carotid bruit.  RESPIRATORY: Bilateral extensive wheezing present anteriorly. No use of accessory muscles. No rales or crackles heard.  CARDIOVASCULAR: Tachycardia present. Both Madison heart sounds are normal. No murmur heard. PMI not lateralized. Chest nontender.  EXTREMITIES: Madison [name]  has 2+ pitting edema with varicose veins and chronic venous insufficiency changes noted.  NEUROLOGIC:  Unable to assess. Madison [name] intubated on Madison vent.  SKIN: Warm and dry.   Chest x-ray shows no active disease. NG and OG tube appear adequately positioned. PH is 7.11. PCO2 is 82.  PaO2 is 298. B-type natriuretic peptide is 111. Troponin is 0.06. CK total is 175, MB is 4.7. PT/INR are 13.5 and 1.0. CBC within normal limits, except white count of 18.7. Glucose is 289. BUN is 12. Creatinine is 1.06. Sodium is 135. Potassium is 4.2. Chloride is 102. EKG showed sinus tachycardia with possible left atrial enlargement.   ASSESSMENT AND PLAN: Madison 64 year old Madison [name] with history of degenerative joint disease, chronic obstructive pulmonary disease who quit recently smoking, history of peripheral vascular disease and chronic leg edema, who comes in with:  1. Acute hypercarbic respiratory failure secondary to chronic obstructive pulmonary disease/emphysema exacerbation. Madison [name] was in impending respiratory failure, got intubated on Madison vent. Per EMS report, Madison [name] was in acute respiratory distress, appeared cyanotic and had to be put on nonrebreather in Madison field. Madison [name] currently is on fentanyl, Versed. Madison [name] received  vecuronium and succinylcholine as well.   Madison [name] will be placed on high dose of IV Solu-Medrol around-Madison-clock due to her extensive wheezing and COPD. Pulmonary consultation with Dr. Meredeth [name]. ER physician has already spoken with Dr. Meredeth [name]. Madison [name]'[name] vent will be managed by pulmonary.   We will do empiric antibiotics with IV Levaquin. Her chest x-ray does not show any evidence of pneumonia for now. We will continue breathing treatments, follow up blood cultures and sputum cultures.   2.  Respiratory acidosis due to #1.  Repeat ABG has been ordered.  3. Elevated troponin, appears to be due to demand ischemia. No history of congestive heart failure or CAD, per daughter.  We  will check echo of Madison heart. Her BNP appears normal. Does not seem to be in heart failure either. We will continue to monitor. Consider cardiology consultation as needed.  4. Hyperglycemia without history of diabetes. We will keep Madison [name] on sliding scale insulin and check A1c. Madison [name] likely has underlying diabetes.  5. Lower extremity edema, appears acute on chronic secondary to likely peripheral vascular disease versus history of spider veins and chronic venous insufficiency. Madison [name] has been on Lasix 20 mg on an as-needed basis, which was increased to 60 mg daily by Madison [name]'[name] primary care physician in Madison last couple of days.  6.  Chronic back pain. Madison [name] has been following pain clinic.  7.  Hyperlipidemia, on atorvastatin.  8.  Deep vein thrombosis prophylaxis, subQ heparin.  9.  Gastrointestinal prophylaxis. We will give her IV Protonix.   Further workup depends on Madison [name]'[name] clinical course. Hospital admission plan was discussed with [place] [name]'[name] daughter, who is present in [facility] Emergency Room. Madison [name] is Madison critical care. Madison [name] is Madison FULL CODE for now.   TIME SPENT: 55 minutes.    ____________________________ Wylie Hail Allena Katz, MD sap:dmm D: 11/22/2013 11:13:39 ET T: 11/22/2013 11:43:43  ET JOB#: [id number]  cc: Telesia Ates [place]. Allena Katz, MD, <Dictator> Mickie Hillier. Madison Penton, MD Willow Ora MD ELECTRONICALLY SIGNED 11/25/2013 10:48

## 2014-12-30 NOTE — Discharge Summary (Signed)
Dates of Admission and Diagnosis:  Date of Admission 22-Nov-2013   Date of Discharge 02-Dec-2013   Admitting Diagnosis Respiratory Failure   Final Diagnosis MRSA Pneumonia   Discharge Diagnosis 1 Respiratory failure   2 Hperglycemia A1c 6.1   3 HTN   4 COPD EXACERBATON    Chief Complaint/History of Present Illness This is a 63 year old female who presented to the hospital due to shortness of breath and respiratory distress, noted to be in acute-on-chronic respiratory failure secondary to COPD exacerbation.   Allergies:  Albumin (Human): Anaphylaxis  Eggs: Anaphylaxis  Flexeril: Alt Ment Status  Sulfa drugs: Rash  Penicillin: Swelling, Rash  Morphine: Unknown  Routine Chem:  27-Mar-15 04:06   Glucose, Serum  63  BUN 17  Creatinine (comp)  0.59  Sodium, Serum 139  Potassium, Serum 3.7  Chloride, Serum 103  CO2, Serum 31  Calcium (Total), Serum 8.7  Anion Gap  5  Osmolality (calc) 277  eGFR (African American) >60  eGFR (Non-African American) >60 (eGFR values <70m/min/1.73 m2 may be an indication of chronic kidney disease (CKD). Calculated eGFR is useful in patients with stable renal function. The eGFR calculation will not be reliable in acutely ill patients when serum creatinine is changing rapidly. It is not useful in  patients on dialysis. The eGFR calculation may not be applicable to patients at the low and high extremes of body sizes, pregnant women, and vegetarians.)  Result Comment LABS - This specimen was collected through an   - indwelling catheter or arterial line.  - A minimum of 588m of blood was wasted prior    - to collecting the sample.  Interpret  - results with caution.  Result(s) reported on 02 Dec 2013 at 05:40AM.  Routine Hem:  27-Mar-15 04:06   Platelet Count (CBC) 165 (Result(s) reported on 02 Dec 2013 at 05:15AM.)   PERTINENT RADIOLOGY STUDIES: XRay:    25-Mar-15 17:11, Chest Portable Single View  Chest Portable Single View   REASON  FOR EXAM:    PICC placement  COMMENTS:       PROCEDURE: DXR - DXR PORTABLE CHEST SINGLE VIEW  - Nov 30 2013  5:11PM     CLINICAL DATA:  PICC placement    EXAM:  PORTABLE CHEST - 1 VIEW    COMPARISON:  11/27/2013    FINDINGS:  Left PICC has itstip projecting in the expected location of the  left subclavian vein.  There is hazy opacity at the right lung base mostly obscuring the  right hemidiaphragm. This has increased from the prior study. Is  most likely combination of atelectasis and pleural fluid. There is  central vascular congestion without overt pulmonary edema.    Nasogastric tube is stable passing below the diaphragm into the  stomach.     IMPRESSION:  1. Left PICC tip is peripheral lying in the left subclavian vein.  2. Increased right base opacity likely combination of a small  effusion with atelectasis.      Electronically Signed    By: DaLajean Manes.D.    On: 11/30/2013 17:14         Verified By: DALasandra BeechM.D.,  LabUnknown:  PACS Image    Pertinent Past History:  Pertinent Past History PAST MEDICAL HISTORY: 1.  Chronic back pain, follows up with Dr. AdAndree Elkt pain clinic.  2.  Allergic rhinitis.  3.  Ovarian cyst.  4.  Hypercholesterolemia.  5.  History of vein stripping with ligation.  6.  History of navicular bone removal, left DJD.  7.  History of tubal ligation.  8.  Tonsillectomy and adenoidectomy.  9.  Cholecystectomy.  10.  History of nephrolithiasis.  11.  History of COPD   Hospital Course:  Hospital Course 1. Acute-on-chronic respiratory failure. This was secondary to chronic obstructive pulmonary disease exacerbation and MRSA pneumonia. The patient presented to the hospital in severe respiratory distress and was urgently intubated in the emergency room; still continues to be intubated. She was extubated on 11/29/2013 and is doing well. She wil continue with Zyvoxx fro MRSA PNA for a total of 4 weeks of treament.  2.COPD  exacerbation. The cause of this COPD exacerbation and probably MRSA pneumonia, as her sputum cultures are positive for MRSA. Her steroids have been weaned to oral. She will continue PRN NEb treatments and use her inhalers. 3. Acute renal failure. This was likely acute tubular necrosis from her hypotension. It has since then improved and resolved with IV fluids.  4..Elevated troponin. This was likely in the setting of demand ischemia from hypoxic respiratory failure. Her troponins did not really trend upwards. She had an echocardiogram done which showed an ejection fraction of 50% to 55% with no evidence of any wall-motion abnormalities.  5.Hyperglycemia. This was likely partly steroid mediated. Her A1c is 6.1. She was started on Levemir, but at this time, her blood sugars should be monitored and she should be followed for diabetes. 6. Nausea and vomiting with bilious return in NG She likely had an ileus which has now resolved. Nasogastric tube has been discontinued and she is tolerating her diet well. 7. Urinary retention: Patient now has a foley and will need a voiding trial in one week. She has been started on Flomax.   Condition on Discharge Stable   DISCHARGE INSTRUCTIONS HOME MEDS:  Medication Reconciliation: Patient's Home Medications at Discharge:     Medication Instructions  atorvastatin 10 mg oral tablet  1 tab(s) orally once a day (at bedtime)   carisoprodol 350 mg oral tablet  1 tab(s) orally 2 times a day, As Needed for muscle spasms when not taking Robaxin   duloxetine 60 mg oral delayed release capsule  1 cap(s) orally once a day   furosemide 20 mg oral tablet  1 tab(s) orally once a day, As Needed for edema   albuterol-ipratropium 2.5 mg-0.5 mg/3 ml inhalation solution  3  inhaled every 4 hours, As Needed - for Wheezing   pulmicort flexhaler 180 mcg/inh inhalation powder  1 puff(s) inhaled 2 times a day   spiriva 18 mcg inhalation capsule  1 tab(s) inhaled once a day    prednisone 10 mg oral tablet  6 tab(s) orally once a day x 2 daystab(s) orally once a day x 2 daystab(s) orally once a day x 2 daystab(s) orally once a day x 2 daystab(s) orally once a day x 2 daystab(s) orally once a day x 2 days   linezolid 600 mg oral tablet  1 tab(s) orally every 12 hours x 25 days   acetaminophen 325 mg oral tablet  2 tab(s) orally every 4 hours, As needed, pain or temp. greater than 100.4   tamsulosin 0.4 mg oral capsule  1 cap(s) orally once a day (after a meal)   metoprolol tartrate 50 mg oral tablet  1 tab(s) orally every 12 hours   insulin aspart 100 units/ml subcutaneous solution   subcutaneous unit(s) if FSBS 151 - 200unit(s) if FSBS 201 - 250unit(s) if FSBS 251 -  300unit(s) if FSBS 301 - 350unit(s) if FSBS 351 - 400    STOP TAKING THE FOLLOWING MEDICATION(S):    loratadine-pseudoephedrine 5 mg-120 mg oral tablet, extended release: 1 tab(s) orally every 12 hours acetaminophen-hydrocodone 325 mg-5 mg oral tablet: 1 tab(s) orally 3 times a day, As Needed potassium chloride 10 meq oral tablet, extended release: 1 tab(s) orally 3 times a day promethazine dm 15 mg-6.25 mg/5 ml oral syrup: 5-10 milliliter(s) orally every 4 hours, As Needed naproxen 500 mg oral tablet: 1 tab(s) orally 2 times a day, As Needed  Physician's Instructions:  Home Health? No   Treatments Foley to leg bag   Home Oxygen? Yes   Portable Tank? Yes   Oxygen delivery at home: 2L  Nasal Cannula   Diet Carbohydrate Controlled (ADA) Diet   Activity Limitations As tolerated   Referrals speech and PT   Return to Work Not Applicable   Time frame for Follow Up Appointment 1-2 weeks   Other Comments VOIDING TRIAL IN ONE WEEK KEEP FOLEY UNTIL THEN.   Electronic Signatures: Bettey Costa (MD)  (Signed 27-Mar-15 10:11)  Authored: ADMISSION DATE AND DIAGNOSIS, CHIEF COMPLAINT/HPI, Allergies, PERTINENT LABS, PERTINENT RADIOLOGY STUDIES, PERTINENT PAST HISTORY, HOSPITAL COURSE, DISCHARGE  INSTRUCTIONS HOME MEDS, PATIENT INSTRUCTIONS   Last Updated: 27-Mar-15 10:11 by Bettey Costa (MD)

## 2015-01-15 ENCOUNTER — Telehealth: Payer: Self-pay | Admitting: Anesthesiology

## 2015-01-15 MED ORDER — METHOCARBAMOL 750 MG PO TABS: 750.0000 mg | ORAL_TABLET | Freq: Three times a day (TID) | ORAL | Status: DC | PRN

## 2015-01-15 NOTE — Telephone Encounter (Signed)
Pt needs refill on robaxin 750mg    [facility] [address] [place]

## 2015-01-15 NOTE — Telephone Encounter (Signed)
Called patient informed that robaxin was called in.

## 2015-03-05 ENCOUNTER — Encounter: Payer: Self-pay | Admitting: Anesthesiology

## 2015-03-05 ENCOUNTER — Ambulatory Visit: Payer: BLUE CROSS/BLUE SHIELD | Attending: Anesthesiology | Admitting: Anesthesiology

## 2015-03-05 VITALS — BP 135/58 | HR 67 | Temp 97.2°F | Resp 18 | Ht 59.0 in | Wt 210.0 lb

## 2015-03-05 DIAGNOSIS — M47812 Spondylosis without myelopathy or radiculopathy, cervical region: Secondary | ICD-10-CM

## 2015-03-05 DIAGNOSIS — M545 Low back pain: Secondary | ICD-10-CM | POA: Diagnosis not present

## 2015-03-05 DIAGNOSIS — M5386 Other specified dorsopathies, lumbar region: Secondary | ICD-10-CM

## 2015-03-05 DIAGNOSIS — M5136 Other intervertebral disc degeneration, lumbar region: Secondary | ICD-10-CM | POA: Insufficient documentation

## 2015-03-05 MED ORDER — HYDROCODONE-ACETAMINOPHEN 5-325 MG PO TABS: 1.0000 | ORAL_TABLET | Freq: Four times a day (QID) | ORAL | Status: DC | PRN

## 2015-03-05 MED ORDER — ROPIVACAINE HCL 2 MG/ML IJ SOLN
INTRAMUSCULAR | Status: AC
  Administered 2015-03-05: 18 mL
  Filled 2015-03-05: qty 10

## 2015-03-05 MED ORDER — TRIAMCINOLONE ACETONIDE 40 MG/ML IJ SUSP
INTRAMUSCULAR | Status: AC
  Administered 2015-03-05: 40 mg
  Filled 2015-03-05: qty 1

## 2015-03-05 MED ORDER — LIDOCAINE HCL (PF) 1 % IJ SOLN
INTRAMUSCULAR | Status: AC
  Administered 2015-03-05: 6 mL
  Filled 2015-03-05: qty 5

## 2015-03-05 MED ORDER — MIDAZOLAM HCL 5 MG/5ML IJ SOLN
INTRAMUSCULAR | Status: AC
  Administered 2015-03-05: 1 mg via INTRAVENOUS
  Filled 2015-03-05: qty 5

## 2015-03-05 MED ORDER — FENTANYL CITRATE (PF) 100 MCG/2ML IJ SOLN
INTRAMUSCULAR | Status: AC
  Filled 2015-03-05: qty 2

## 2015-03-05 MED ORDER — DEXAMETHASONE SODIUM PHOSPHATE 4 MG/ML IJ SOLN
INTRAMUSCULAR | Status: AC
  Administered 2015-03-05: 4 mg
  Filled 2015-03-05: qty 1

## 2015-03-05 NOTE — Progress Notes (Signed)
Chief complaint: Severe low back pain with radiation into the right hip and buttock region.  Procedure: Right Side lumbar facet medial branch block at L2-3 and L3-4 L4-5 L5-S1 and S1 under fluoroscopic guidance with moderate sedation  History of present illness:Madison [name] for reevaluation today. She was last seen in April. She is also had previous facet blocks. She states that these work well for her. At present she's describing a pain that is in the right lower back with radiation into the right hip and buttock region. This radiates down the right posterior leg. This pain is consistent with what she has experienced in the past. She had good relief with previous facet blocks yielding 50-75% relief generally lasting between 1-2 months at least induration. No change in lower extremity strength or function or bowel bladder function is noted at this time. She is taking her medications as prescribed and these are generally working well for her.   Current outpatient prescriptions:  .  atorvastatin (LIPITOR) 20 MG tablet, Take by mouth., Disp: , Rfl:  .  carisoprodol (SOMA) 350 MG tablet, Take 1 tablet by mouth Twice daily as needed if not using Robaxin, Disp: , Rfl:  .  cyanocobalamin (V-R VITAMIN B-12) 500 MCG tablet, Take by mouth., Disp: , Rfl:  .  DULoxetine (CYMBALTA) 30 MG capsule, Take 30 mg in am and 60 mg at night., Disp: , Rfl:  .  fluticasone (FLONASE) 50 MCG/ACT nasal spray, Place into the nose., Disp: , Rfl:  .  furosemide (LASIX) 20 MG tablet, One daily, may increase to two to three daily prn for edema, Disp: , Rfl:  .  HYDROcodone-acetaminophen (NORCO/VICODIN) 5-325 MG per tablet, Take by mouth., Disp: , Rfl:  .  loratadine-pseudoephedrine (CLARITIN-D 12-HOUR) 5-120 MG per tablet, Take by mouth., Disp: , Rfl:  .  methocarbamol (ROBAXIN-750) 750 MG tablet, Take 1 tablet (750 mg total) by mouth 3 (three) times daily as needed for muscle spasms., Disp: 90 tablet, Rfl: 1 .   metoprolol (LOPRESSOR) 50 MG tablet, Take by mouth., Disp: , Rfl:  .  naproxen (NAPROSYN) 500 MG tablet, Take by mouth., Disp: , Rfl:  .  Olopatadine HCl (PATADAY) 0.2 % SOLN, Apply to eye., Disp: , Rfl:  .  potassium chloride (K-DUR) 10 MEQ tablet, Take one potassium for each Furosemide 20 you take, Disp: , Rfl:   Current facility-administered medications:  .  dexamethasone (DECADRON) 4 MG/ML injection, , , ,  .  fentaNYL (SUBLIMAZE) 100 MCG/2ML injection, , , ,  .  lidocaine (PF) (XYLOCAINE) 1 % injection, , , ,  .  midazolam (VERSED) 5 MG/5ML injection, , , ,  .  ropivacaine (PF) 2 mg/ml (0.2%) (NAROPIN) 2 MG/ML epidural, , , ,  .  triamcinolone acetonide (KENALOG-40) 40 MG/ML injection, , , ,   BP 129/69 mmHg  Pulse 74  Temp(Src) 97.2 F (36.2 C) (Oral)  Resp 18  Ht 4\' 11"  (1.499 m)  Wt 210 lb (95.255 kg)  BMI 42.39 kg/m2  SpO2 98%  Patient Active Problem List   Diagnosis Date Noted  . Facet arthritis of cervical region 03/05/2015  . DDD (degenerative disc disease), lumbar 03/05/2015    Allergies  Allergen Reactions  . Eggs Or Egg-Derived Products Anaphylaxis    Egg albumin  . Morphine And Related   . Spiriva Handihaler [Tiotropium Bromide Monohydrate] Other (See Comments)    bronchiospasms  . Sulfa Antibiotics Hives  . Penicillins Rash  . Voltaren [Diclofenac] Rash  Physical exam: Patient is alert and oriented 3 and cooperative and compliant.  Heart is regular rate and rhythm without murmur  Lungs are clear to auscultation  Inspection of the low back reveals significant paraspinous muscle tenderness but no overt trigger points with worsening of pain on extension with lateral rotation to the right side causing radiation of pain to the ipsilateral hip and buttock region. Strength appears to be a baseline with no change in sensory or motor function.  Assessment:  #1 right side facet arthropathy  #2 degenerative disc disease  #3 myofascial low back  pain  #4 chronic opioid management  Plan:  #1 we will proceed with a lumbar facet block today as described to the patient. All questions have been answered and the risks and benefits reviewed in full detail. They desire to proceed with this plan today. There is no guarantee of pain relief as discussed with the patient.  #2 continue with back stretching strengthening exercises as tolerated and I have reiterated that this is important to help reduce spasming in the lower back.  #3 we will have her return to clinic in 2 months. We'll also have her continue the current medications with the Vicodin at a twice a day to 3 times a day dosing regimen and two-month refill on the prescription.  Dr. Chima Astorino Gwenyth Benderedure night. Patient is taken to the fluoroscopy suite and placed in the prone position with an IV in place and percent was titrated for moderate sedation 1 mg. There The right side facets prepped with Betadine 3. Using AP and tunnel-type fluoroscopic guidance identified areas overlying the after mentioned facets. 1% lidocaine was infiltrated as mentioned then using strict aseptic technique I advanced a 22-gauge 5 inch needle with the needle tip to lie at the [place] portion of the [employer] dog. There is negative aspiration for heme or CSF no paresthesia and 2 cc of ropivacaine 0.2% mixed with 8 mg of triamcinolone was injected at L2-3 and L3-4 L4-5 L5-S1 and these needles were then withdrawn. This was tolerated without difficulty. The L5-S1 needle was redirected towards the S1 posterior foramen once again advanced without paresthesia and this also was tolerated without paresthesia 2 cc of the same mixture was injected at that site and the patient was convalesced discharged home stable condition for follow-up as mentioned.

## 2015-03-05 NOTE — Progress Notes (Signed)
Safety precautions to be maintained throughout the outpatient stay will include: orient to surroundings, keep bed in low position, maintain call bell within reach at all times, provide assistance with transfer out of bed and ambulation.  

## 2015-03-05 NOTE — Patient Instructions (Signed)
Pain Management Discharge Instructions  General Discharge Instructions :  If you need to reach your doctor call: Monday-Friday 8:00 am - 4:00 pm at [phone] or toll free [phone].  After clinic hours [phone] to have operator reach doctor.  Bring all of your medication bottles to all your appointments in the pain clinic.  To cancel or reschedule your appointment with Pain Management please remember to call 24 hours in advance to avoid a fee.  Refer to the educational materials which you have been given on: General Risks, I had my Procedure. Discharge Instructions, Post Sedation.  Post Procedure Instructions:  The drugs you were given will stay in your system until tomorrow, so for the next 24 hours you should not drive, make any legal decisions or drink any alcoholic beverages.  You may eat anything you prefer, but it is better to start with liquids then soups and crackers, and gradually work up to solid foods.  Please notify your doctor immediately if you have any unusual bleeding, trouble breathing or pain that is not related to your normal pain.  Depending on the type of procedure that was done, some parts of your body may feel week and/or numb.  This usually clears up by tonight or the next day.  Walk with the use of an assistive device or accompanied by an adult for the 24 hours.  You may use ice on the affected area for the first 24 hours.  Put ice in a Ziploc bag and cover with a towel and place against area 15 minutes on 15 minutes off.  You may switch to heat after 24 hours.  TWO prescriptions for HYDROCODONE was given to you today.

## 2015-03-06 ENCOUNTER — Telehealth: Payer: Self-pay | Admitting: *Deleted

## 2015-03-06 NOTE — Telephone Encounter (Signed)
No answer. No voicemail to leave message

## 2015-03-29 ENCOUNTER — Other Ambulatory Visit: Payer: Self-pay | Admitting: Anesthesiology

## 2015-04-13 ENCOUNTER — Telehealth: Payer: Self-pay | Admitting: Anesthesiology

## 2015-04-13 MED ORDER — METHOCARBAMOL 750 MG PO TABS: 750.0000 mg | ORAL_TABLET | Freq: Three times a day (TID) | ORAL | Status: DC | PRN

## 2015-04-13 NOTE — Telephone Encounter (Signed)
Madison [name] has appt for 05-07-15 for med refills and was asking if she will get radiofrequency at the same time, says someone told her it had to be prior authorized ?

## 2015-04-13 NOTE — Telephone Encounter (Signed)
Needs refill on robaxin, [facility] on [address] / sorry if this is duplicate request

## 2015-04-13 NOTE — Telephone Encounter (Signed)
Patient wants robaxin  Refilled. Called patient and then called Dr Pernell Dupre and prescription called into [facility] on [address]

## 2015-04-13 NOTE — Telephone Encounter (Signed)
Needs refill on robaxin [facility] on [address]

## 2015-05-07 ENCOUNTER — Encounter: Payer: BLUE CROSS/BLUE SHIELD | Admitting: Anesthesiology

## 2015-05-08 NOTE — Telephone Encounter (Signed)
Pt had appt Mon 05-07-15 at 2:00 with dr Pernell Dupre / did not show / tried to call x2 / pt did not answer phone/

## 2015-06-19 ENCOUNTER — Other Ambulatory Visit: Payer: Self-pay | Admitting: Anesthesiology

## 2015-08-09 ENCOUNTER — Ambulatory Visit: Payer: BLUE CROSS/BLUE SHIELD | Admitting: Anesthesiology

## 2015-08-14 ENCOUNTER — Ambulatory Visit: Payer: BLUE CROSS/BLUE SHIELD | Admitting: Anesthesiology

## 2015-12-24 ENCOUNTER — Other Ambulatory Visit: Payer: Self-pay | Admitting: Anesthesiology

## 2016-01-17 ENCOUNTER — Other Ambulatory Visit: Payer: Self-pay | Admitting: Anesthesiology

## 2016-04-21 ENCOUNTER — Other Ambulatory Visit: Payer: Self-pay | Admitting: Family Medicine

## 2016-04-21 DIAGNOSIS — Z780 Asymptomatic menopausal state: Secondary | ICD-10-CM

## 2016-04-29 ENCOUNTER — Ambulatory Visit: Payer: BLUE CROSS/BLUE SHIELD | Attending: Anesthesiology | Admitting: Anesthesiology

## 2016-04-29 ENCOUNTER — Encounter: Payer: Self-pay | Admitting: Anesthesiology

## 2016-04-29 VITALS — BP 146/77 | HR 79 | Temp 98.6°F | Resp 16 | Ht 59.0 in | Wt 210.0 lb

## 2016-04-29 DIAGNOSIS — M5431 Sciatica, right side: Secondary | ICD-10-CM | POA: Diagnosis not present

## 2016-04-29 DIAGNOSIS — M1288 Other specific arthropathies, not elsewhere classified, other specified site: Secondary | ICD-10-CM | POA: Diagnosis not present

## 2016-04-29 DIAGNOSIS — M545 Low back pain: Secondary | ICD-10-CM | POA: Insufficient documentation

## 2016-04-29 DIAGNOSIS — F1120 Opioid dependence, uncomplicated: Secondary | ICD-10-CM | POA: Diagnosis not present

## 2016-04-29 DIAGNOSIS — M5136 Other intervertebral disc degeneration, lumbar region: Secondary | ICD-10-CM | POA: Insufficient documentation

## 2016-04-29 MED ORDER — METHOCARBAMOL 750 MG PO TABS: 750.0000 mg | ORAL_TABLET | Freq: Three times a day (TID) | ORAL | 5 refills | Status: DC

## 2016-04-29 NOTE — Progress Notes (Signed)
Safety precautions to be maintained throughout the outpatient stay will include: orient to surroundings, keep bed in low position, maintain call bell within reach at all times, provide assistance with transfer out of bed and ambulation.  

## 2016-04-30 NOTE — Progress Notes (Signed)
Chief complaint: Severe low back pain with radiation into the right hip and buttock region. Procedure: None Previous Procedure: Right Side lumbar facet medial branch block at L2-3 and L3-4 L4-5 L5-S1 and S1 under fluoroscopic guidance with moderate sedation  History of present illness:Madison Tobie PoetSue Shafferpresents for reevaluation today. She has had some recurrence of the same pain that prompted the facet injections in the past. She did very well with these and got several months of relief. Otherwise she is doing reasonably well taking her medications as prescribed and needs a refill today. She's been cooperative with her regimen and compliant. No evidence of diverting or illicit use is noted. She is requesting a return in the next several weeks for a repeat injection for the facets. Otherwise no change in lower extremity strength function or bowel bladder function is noted.   . Current Outpatient Prescriptions:  .  atorvastatin (LIPITOR) 20 MG tablet, Take by mouth., Disp: , Rfl:  .  DULoxetine (CYMBALTA) 30 MG capsule, Take 30 mg in am and 60 mg at night., Disp: , Rfl:  .  fluticasone (FLONASE) 50 MCG/ACT nasal spray, Place into the nose., Disp: , Rfl:  .  furosemide (LASIX) 20 MG tablet, One daily, may increase to two to three daily prn for edema, Disp: , Rfl:  .  Olopatadine HCl 0.2 % SOLN, Apply to eye., Disp: , Rfl:  .  potassium chloride (K-DUR) 10 MEQ tablet, Take one potassium for each Furosemide 20 you take, Disp: , Rfl:  .  atorvastatin (LIPITOR) 20 MG tablet, Take by mouth., Disp: , Rfl:  .  carisoprodol (SOMA) 350 MG tablet, Take 1 tablet by mouth Twice daily as needed if not using Robaxin, Disp: , Rfl:  .  cyanocobalamin (V-R VITAMIN B-12) 500 MCG tablet, Take by mouth., Disp: , Rfl:  .  DULoxetine (CYMBALTA) 30 MG capsule, Take 30 mg in am and 60 mg at night., Disp: , Rfl:  .  fluticasone (FLONASE) 50 MCG/ACT nasal spray, Place into the nose., Disp: , Rfl:  .  fluticasone (FLONASE) 50  MCG/ACT nasal spray, , Disp: , Rfl:  .  furosemide (LASIX) 20 MG tablet, One daily, may increase to two to three daily prn for edema, Disp: , Rfl:  .  HYDROcodone-acetaminophen (NORCO/VICODIN) 5-325 MG per tablet, Take 1 tablet by mouth every 6 (six) hours as needed for moderate pain (fill [id number])., Disp: 90 tablet, Rfl: 0 .  loratadine-pseudoephedrine (CLARITIN-D 12-HOUR) 5-120 MG per tablet, Take by mouth., Disp: , Rfl:  .  methocarbamol (ROBAXIN) 750 MG tablet, Take 1 tablet (750 mg total) by mouth 3 (three) times daily., Disp: 90 tablet, Rfl: 5 .  metoprolol (LOPRESSOR) 50 MG tablet, Take by mouth., Disp: , Rfl:  .  metoprolol (LOPRESSOR) 50 MG tablet, , Disp: , Rfl:  .  naproxen (NAPROSYN) 500 MG tablet, , Disp: , Rfl:  .  Olopatadine HCl (PATADAY) 0.2 % SOLN, Apply to eye., Disp: , Rfl:  .  potassium chloride (K-DUR) 10 MEQ tablet, Take one potassium for each Furosemide 20 you take, Disp: , Rfl:   BP (!) 146/77 (BP Location: Left Arm, Patient Position: Sitting, Cuff Size: Large)   Pulse 79   Temp 98.6 F (37 C) (Oral)   Resp 16   Ht 4\' 11"  (1.499 m)   Wt 210 lb (95.3 kg)   SpO2 97%   BMI 42.41 kg/m   Patient Active Problem List   Diagnosis Date Noted  . Facet arthritis of cervical  region [facility](HCC) 03/05/2015  . DDD (degenerative disc disease), lumbar 03/05/2015    Allergies  Allergen Reactions  . Eggs Or Egg-Derived Products Anaphylaxis    Egg albumin  . Morphine And Related   . Spiriva Handihaler [Tiotropium Bromide Monohydrate] Other (See Comments)    bronchiospasms  . Sulfa Antibiotics Hives  . Penicillins Rash  . Voltaren [Diclofenac] Rash      Physical exam: Patient is alert and oriented 3 and cooperative and compliant.  Heart is regular rate and rhythm without murmur  Lungs are clear to auscultation  Inspection of the low back reveals significant paraspinous muscle tenderness but no overt trigger points with worsening of pain on extension with lateral  rotation to the right side causing radiation of pain to the ipsilateral hip and buttock region. Strength appears to be a baseline with no change in sensory or motor function.  Assessment:  #1 right side facet arthropathy  #2 degenerative disc disease  #3 myofascial low back pain  #4 chronic opioid management  Plan:  #1 we will proceed with scheduling a lumbar facet block today as described to the patient. All questions have been answered and the risks and benefits reviewed in full detail. She desires to proceed with this plan . There is no guarantee of pain relief as discussed with the patient.  #2 continue with back stretching strengthening exercises as tolerated and I have reiterated that this is important to help reduce spasming in the lower back.  #3 we will have her return to clinic in a few weeks. We'll also have her continue the current medications with the Vicodin at a twice a day to 3 times a day dosing regimen. We have gone over the risks and benefits of chronic opioid administration and the need to minimize usage to avoid possible long-term side effects once again..  Dr. Gwenyth BenderJames Jameire Kouba MD

## 2016-05-07 ENCOUNTER — Emergency Department: Payer: BLUE CROSS/BLUE SHIELD

## 2016-05-07 ENCOUNTER — Emergency Department
Admission: EM | Admit: 2016-05-07 | Discharge: 2016-05-07 | Disposition: A | Discharge status: Home / Self Care | Payer: BLUE CROSS/BLUE SHIELD | Attending: Emergency Medicine | Admitting: Emergency Medicine

## 2016-05-07 ENCOUNTER — Encounter: Payer: Self-pay | Admitting: Emergency Medicine

## 2016-05-07 DIAGNOSIS — M4806 Spinal stenosis, lumbar region: Secondary | ICD-10-CM | POA: Diagnosis not present

## 2016-05-07 DIAGNOSIS — Z87891 Personal history of nicotine dependence: Secondary | ICD-10-CM | POA: Diagnosis not present

## 2016-05-07 DIAGNOSIS — R32 Unspecified urinary incontinence: Secondary | ICD-10-CM | POA: Diagnosis present

## 2016-05-07 DIAGNOSIS — Z79899 Other long term (current) drug therapy: Secondary | ICD-10-CM | POA: Diagnosis not present

## 2016-05-07 DIAGNOSIS — M48061 Spinal stenosis, lumbar region without neurogenic claudication: Secondary | ICD-10-CM

## 2016-05-07 MED ORDER — HYDROCODONE-ACETAMINOPHEN 5-325 MG PO TABS
1.0000 | ORAL_TABLET | Freq: Once | ORAL | Status: AC
  Administered 2016-05-07: 1 via ORAL
  Filled 2016-05-07: qty 1

## 2016-05-07 MED ORDER — DIAZEPAM 5 MG PO TABS: 5.0000 mg | ORAL_TABLET | Freq: Three times a day (TID) | ORAL | 0 refills | Status: DC | PRN

## 2016-05-07 MED ORDER — HYDROCODONE-ACETAMINOPHEN 5-325 MG PO TABS: 1.0000 | ORAL_TABLET | ORAL | 0 refills | Status: DC | PRN

## 2016-05-07 MED ORDER — DIAZEPAM 5 MG PO TABS
5.0000 mg | ORAL_TABLET | Freq: Once | ORAL | Status: AC
Start: 2016-05-07 — End: 2016-05-07
  Administered 2016-05-07: 5 mg via ORAL
  Filled 2016-05-07: qty 1

## 2016-05-07 NOTE — ED Triage Notes (Signed)
Pt reports hx of sciatica and back problems. 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 Pt states that now she either can not urinate or has incontinence. Pt called MD and was advised to come to the ED.

## 2016-05-07 NOTE — ED Provider Notes (Signed)
[facility] Emergency Department Provider Note   ____________________________________________   None    (approximate)  I have reviewed the triage vital signs and the nursing notes.   HISTORY  Chief Complaint Back Pain    HPI Madison [name] is a 65 y.o. female patient complain of urinary incontinence for 3 days. Patient states she either cannot urinate or has incontinence upon standing. Patient has a history of sciatica and chronic back pain. 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 Patient called her PCP for an appointment and was told to come to the ER for definitive evaluation. Patient denies any bowel dysfunction.   Past Medical History:  Diagnosis Date  . Blood transfusion without reported diagnosis   . Hyperlipidemia   . Tachycardia     Patient Active Problem List   Diagnosis Date Noted  . Facet arthritis of cervical region (HCC) 03/05/2015  . DDD (degenerative disc disease), lumbar 03/05/2015    Past Surgical History:  Procedure Laterality Date  . ANKLE SURGERY Right   . APPENDECTOMY    . CARPAL TUNNEL RELEASE Left   . CHOLECYSTECTOMY    . TONSILLECTOMY    . TUBAL LIGATION    . VEIN LIGATION AND STRIPPING      Prior to Admission medications   Medication Sig Start Date End Date Taking? Authorizing Provider  atorvastatin (LIPITOR) 20 MG tablet Take by mouth. 01/25/14   Historical Provider, MD  atorvastatin (LIPITOR) 20 MG tablet Take by mouth. 04/17/16 04/17/17  Historical Provider, MD  carisoprodol (SOMA) 350 MG tablet Take 1 tablet by mouth Twice daily as needed if not using Robaxin 07/31/14   Historical Provider, MD  cyanocobalamin (V-R VITAMIN B-12) 500 MCG tablet Take by mouth.    Historical Provider, MD  diazepam (VALIUM) 5 MG tablet Take 1 tablet (5 mg total) by mouth every 8 (eight) hours as needed for anxiety. 05/07/16 05/07/17  Joni Reining, PA-C  DULoxetine (CYMBALTA) 30 MG capsule  Take 30 mg in am and 60 mg at night. 07/31/14   Historical Provider, MD  DULoxetine (CYMBALTA) 30 MG capsule Take 30 mg in am and 60 mg at night. 04/17/16   Historical Provider, MD  fluticasone (FLONASE) 50 MCG/ACT nasal spray Place into the nose. 02/01/15 08/01/15  Historical Provider, MD  fluticasone Aleda Grana) 50 MCG/ACT nasal spray  04/17/16   Historical Provider, MD  fluticasone (FLONASE) 50 MCG/ACT nasal spray Place into the nose. 04/17/16 10/15/16  Historical Provider, MD  furosemide (LASIX) 20 MG tablet One daily, may increase to two to three daily prn for edema 01/25/14   Historical Provider, MD  furosemide (LASIX) 20 MG tablet One daily, may increase to two to three daily prn for edema 04/17/16 04/18/17  Historical Provider, MD  HYDROcodone-acetaminophen (NORCO/VICODIN) 5-325 MG per tablet Take 1 tablet by mouth every 6 (six) hours as needed for moderate pain (fill [id number]). 03/05/15   Yevette Edwards, MD  HYDROcodone-acetaminophen (NORCO/VICODIN) 5-325 MG tablet Take 1 tablet by mouth every 4 (four) hours as needed for moderate pain. 05/07/16 05/07/17  Joni Reining, PA-C  loratadine-pseudoephedrine (CLARITIN-D 12-HOUR) 5-120 MG per tablet Take by mouth.    Historical Provider, MD  methocarbamol (ROBAXIN) 750 MG tablet Take 1 tablet (750 mg total) by mouth 3 (three) times daily. 04/29/16   Yevette Edwards, MD  metoprolol (LOPRESSOR) 50 MG tablet Take by mouth. 02/01/15 08/01/15  Historical Provider, MD  metoprolol Ria Bush)  50 MG tablet  02/26/16   Historical Provider, MD  naproxen (NAPROSYN) 500 MG tablet  03/24/16   Historical Provider, MD  Olopatadine HCl (PATADAY) 0.2 % SOLN Apply to eye. 04/21/12   Historical Provider, MD  Olopatadine HCl 0.2 % SOLN Apply to eye. 04/17/16   Historical Provider, MD  potassium chloride (K-DUR) 10 MEQ tablet Take one potassium for each Furosemide 20 you take 01/25/14   Historical Provider, MD  potassium chloride (K-DUR) 10 MEQ tablet Take one potassium for each Furosemide  20 you take 04/17/16 04/18/17  Historical Provider, MD    Allergies Eggs or egg-derived products; Morphine and related; Spiriva handihaler [tiotropium bromide monohydrate]; Sulfa antibiotics; Penicillins; and Voltaren [diclofenac]  Family History  Problem Relation Age of Onset  . Heart disease Mother   . Hypertension Mother   . Hyperlipidemia Mother   . Diabetes Brother     Social History Social History  Substance Use Topics  . Smoking status: Former Smoker    Types: Cigarettes  . Smokeless tobacco: Not on file  . Alcohol use No    Review of Systems Constitutional: No fever/chills Eyes: No visual changes. ENT: No sore throat. Cardiovascular: Denies chest pain. Respiratory: Denies shortness of breath. Gastrointestinal: No abdominal pain.  No nausea, no vomiting.  No diarrhea.  No constipation. Genitourinary:Incontinence Musculoskeletal: Positive for back pain. Skin: Negative for rash. Neurological: Negative for headaches, focal weakness or numbness. Allergic/Immunilogical: See medication list.  ____________________________________________   PHYSICAL EXAM:  VITAL SIGNS: ED Triage Vitals  Enc Vitals Group     BP 05/07/16 1148 (!) 148/64     Pulse Rate 05/07/16 1148 82     Resp 05/07/16 1148 18     Temp 05/07/16 1148 98.6 F (37 C)     Temp Source 05/07/16 1148 Oral     SpO2 05/07/16 1148 100 %     Weight 05/07/16 1149 210 lb (95.3 kg)     Height 05/07/16 1149 4\' 11"  (1.499 m)     Head Circumference --      Peak Flow --      Pain Score 05/07/16 1210 10     Pain Loc --      Pain Edu? --      Excl. in GC? --     Constitutional: Alert and oriented. Well appearing and in no acute distress. Eyes: Conjunctivae are normal. PERRL. EOMI. Head: Atraumatic. Nose: No congestion/rhinnorhea. Mouth/Throat: Mucous membranes are moist.  Oropharynx non-erythematous. Neck: No stridor.  No cervical spine tenderness to palpation. Hematological/Lymphatic/Immunilogical: No  cervical lymphadenopathy. Cardiovascular: Normal rate, regular rhythm. Grossly normal heart sounds.  Good peripheral circulation. Respiratory: Normal respiratory effort.  No retractions. Lungs CTAB. Gastrointestinal: Soft and nontender. No distention. No abdominal bruits. No CVA tenderness. Musculoskeletal: No lower extremity tenderness nor edema.  No joint effusions. No obvious spinal deformity. Neurologic:  Normal speech and language. No gross focal neurologic deficits are appreciated. No gait instability. Skin:  Skin is warm, dry and intact. No rash noted. Psychiatric: Mood and affect are normal. Speech and behavior are normal.  ____________________________________________   LABS (all labs ordered are listed, but only abnormal results are displayed)  Labs Reviewed - No data to display ____________________________________________  EKG   ____________________________________________  RADIOLOGY  CT scan shows severe multiple factorial spinal stenosis L4-L5. Patient has also moderate multiple factorial spinal stenosis at L5-S1 with moderate narrowing of the lateral  Foramen. ____________________________________________   PROCEDURES  Procedure(s) performed: None  Procedures  Critical Care performed:  No  ____________________________________________   INITIAL IMPRESSION / ASSESSMENT AND PLAN / ED COURSE  Pertinent labs & imaging results that were available during my care of the patient were reviewed by me and considered in my medical decision making (see chart for details).  Advance multilevel visit critical spinal stenosis L4-L5. With a superimposed central protrusion. Urinary incontinence. Discuss CT and MRIs patient. Patient will follow-up with neurosurgeon and urologist by calling for appointment. Patient given discharge care instructions. Patient get a prescription for Vicodin and Valium. Patient given a work note.  Clinical Course   Patient had another episode  uncontrolled voiding prior to MRI. Discuss CT and MRI findings with Dr. Lovell Sheehan and he advised patient to follow-up in his office by calling for an appointment tomorrow. Patient also follow with Dr. Sheppard Penton for urinary incontinence.  ____________________________________________   FINAL CLINICAL IMPRESSION(S) / ED DIAGNOSES  Final diagnoses:  Incontinence in female  Spinal stenosis at L4-L5 level      NEW MEDICATIONS STARTED DURING THIS VISIT:  New Prescriptions   DIAZEPAM (VALIUM) 5 MG TABLET    Take 1 tablet (5 mg total) by mouth every 8 (eight) hours as needed for anxiety.   HYDROCODONE-ACETAMINOPHEN (NORCO/VICODIN) 5-325 MG TABLET    Take 1 tablet by mouth every 4 (four) hours as needed for moderate pain.     Note:  This document was prepared using Dragon voice recognition software and may include unintentional dictation errors.    Joni Reining, PA-C 05/07/16 1552    Joni Reining, PA-C 05/07/16 1554    Loleta Rose, MD 05/09/16 1057

## 2016-05-09 HISTORY — PX: BACK SURGERY: SHX140

## 2016-05-13 ENCOUNTER — Encounter: Payer: Self-pay | Admitting: Emergency Medicine

## 2016-05-13 ENCOUNTER — Telehealth: Payer: Self-pay

## 2016-05-13 ENCOUNTER — Emergency Department
Admission: EM | Admit: 2016-05-13 | Discharge: 2016-05-13 | Disposition: A | Discharge status: Home / Self Care | Payer: BLUE CROSS/BLUE SHIELD | Attending: Emergency Medicine | Admitting: Emergency Medicine

## 2016-05-13 ENCOUNTER — Telehealth: Payer: Self-pay | Admitting: *Deleted

## 2016-05-13 DIAGNOSIS — M4806 Spinal stenosis, lumbar region: Secondary | ICD-10-CM | POA: Insufficient documentation

## 2016-05-13 DIAGNOSIS — Z87891 Personal history of nicotine dependence: Secondary | ICD-10-CM | POA: Diagnosis not present

## 2016-05-13 DIAGNOSIS — M48061 Spinal stenosis, lumbar region without neurogenic claudication: Secondary | ICD-10-CM

## 2016-05-13 DIAGNOSIS — M549 Dorsalgia, unspecified: Secondary | ICD-10-CM | POA: Diagnosis present

## 2016-05-13 MED ORDER — OXYCODONE-ACETAMINOPHEN 5-325 MG PO TABS
2.0000 | ORAL_TABLET | Freq: Once | ORAL | Status: AC
  Administered 2016-05-13: 2 via ORAL
  Filled 2016-05-13: qty 2

## 2016-05-13 MED ORDER — DIAZEPAM 5 MG PO TABS: 5.0000 mg | ORAL_TABLET | Freq: Three times a day (TID) | ORAL | 0 refills | Status: DC | PRN

## 2016-05-13 MED ORDER — OXYCODONE-ACETAMINOPHEN 7.5-325 MG PO TABS: 1.0000 | ORAL_TABLET | ORAL | 0 refills | Status: DC | PRN

## 2016-05-13 NOTE — ED Notes (Signed)
Pt alert and oriented X4, active, cooperative, pt in NAD. RR even and unlabored, color WNL.  Pt informed to return if any life threatening symptoms occur.   

## 2016-05-13 NOTE — ED Triage Notes (Signed)
Pt to ed with c/o back pain.  Pt states about 1 week ago she was moving a pt and has since had pain.  Pt states she has tried to get appt with dr Lovell Sheehanjenkins.  Pt was using hydrocodone but states she is out but that it wasn't helping anyway.  Pt also has been using diazepam without relief.

## 2016-05-13 NOTE — ED Notes (Signed)
See triage note  Was seen last week with back pain  conts to have pain in back   Has ran out of pain meds  Ambulates well  Denies additional injury

## 2016-05-13 NOTE — Telephone Encounter (Signed)
Pt went to emergency room last Wednesday and had CT scan and MRI they referred pt to Dr. Lovell SheehanJenkins. Pt called and called to office and never got an appt she wants to know what can Dr. Pernell DupreAdams do for her?

## 2016-05-13 NOTE — ED Provider Notes (Signed)
[facility] Emergency Department Provider Note  ____________________________________________   None    (approximate)  I have reviewed the triage vital signs and the nursing notes.   HISTORY  Chief Complaint Back Pain   HPI Madison [name] is a 65 y.o. 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 Given valium and vicodan to treat urinary incontinence and back pain. Urinary symptoms have improved and patient does have an appointment with urologist on  9/11. Pain is uncontrolled and worsened. Pain is constant and keeping patient from sleeping. Exacerbated by movement and straining to have BM. Still having BMs, taking miralax to help with straining. Has taken medication as prescribed without relief.   Patient was referred to neurosurgery for spinal stenosis, but has not been able to get an appointment yet. Scheduled for facet block in 2 days for treatment of right sciatica. Patient is able to ambulate when holding on to a walker and furniture for support, but has trouble with feeling like the right side may "give out".   Past Medical History:  Diagnosis Date  . Blood transfusion without reported diagnosis   . Hyperlipidemia   . Tachycardia     Patient Active Problem List   Diagnosis Date Noted  . Facet arthritis of cervical region (HCC) 03/05/2015  . DDD (degenerative disc disease), lumbar 03/05/2015    Past Surgical History:  Procedure Laterality Date  . ANKLE SURGERY Right   . APPENDECTOMY    . CARPAL TUNNEL RELEASE Left   . CHOLECYSTECTOMY    . TONSILLECTOMY    . TUBAL LIGATION    . VEIN LIGATION AND STRIPPING      Prior to Admission medications   Medication Sig Start Date End Date Taking? Authorizing Provider  atorvastatin (LIPITOR) 20 MG tablet Take by mouth. 01/25/14   Historical Provider, MD  atorvastatin (LIPITOR) 20 MG tablet Take by mouth. 04/17/16 04/17/17  Historical Provider, MD  carisoprodol (SOMA) 350 MG  tablet Take 1 tablet by mouth Twice daily as needed if not using Robaxin 07/31/14   Historical Provider, MD  cyanocobalamin (V-R VITAMIN B-12) 500 MCG tablet Take by mouth.    Historical Provider, MD  diazepam (VALIUM) 5 MG tablet Take 1 tablet (5 mg total) by mouth every 8 (eight) hours as needed for anxiety. 05/07/16 05/07/17  Joni Reiningonald K Sakari Raisanen, PA-C  diazepam (VALIUM) 5 MG tablet Take 1 tablet (5 mg total) by mouth every 8 (eight) hours as needed for anxiety. 05/13/16 05/13/17  Joni Reiningonald K Haisley Arens, PA-C  DULoxetine (CYMBALTA) 30 MG capsule Take 30 mg in am and 60 mg at night. 07/31/14   Historical Provider, MD  DULoxetine (CYMBALTA) 30 MG capsule Take 30 mg in am and 60 mg at night. 04/17/16   Historical Provider, MD  fluticasone (FLONASE) 50 MCG/ACT nasal spray Place into the nose. 02/01/15 08/01/15  Historical Provider, MD  fluticasone Aleda Grana(FLONASE) 50 MCG/ACT nasal spray  04/17/16   Historical Provider, MD  fluticasone (FLONASE) 50 MCG/ACT nasal spray Place into the nose. 04/17/16 10/15/16  Historical Provider, MD  furosemide (LASIX) 20 MG tablet One daily, may increase to two to three daily prn for edema 01/25/14   Historical Provider, MD  furosemide (LASIX) 20 MG tablet One daily, may increase to two to three daily prn for edema 04/17/16 04/18/17  Historical Provider, MD  HYDROcodone-acetaminophen (NORCO/VICODIN) 5-325 MG per tablet Take 1 tablet by mouth every 6 (six) hours as needed for moderate pain (fill  [id number]). 03/05/15   Yevette Edwards, MD  HYDROcodone-acetaminophen (NORCO/VICODIN) 5-325 MG tablet Take 1 tablet by mouth every 4 (four) hours as needed for moderate pain. 05/07/16 05/07/17  Joni Reining, PA-C  loratadine-pseudoephedrine (CLARITIN-D 12-HOUR) 5-120 MG per tablet Take by mouth.    Historical Provider, MD  methocarbamol (ROBAXIN) 750 MG tablet Take 1 tablet (750 mg total) by mouth 3 (three) times daily. 04/29/16   Yevette Edwards, MD  metoprolol (LOPRESSOR) 50 MG tablet Take by mouth. 02/01/15 08/01/15   Historical Provider, MD  metoprolol (LOPRESSOR) 50 MG tablet  02/26/16   Historical Provider, MD  naproxen (NAPROSYN) 500 MG tablet  03/24/16   Historical Provider, MD  Olopatadine HCl (PATADAY) 0.2 % SOLN Apply to eye. 04/21/12   Historical Provider, MD  Olopatadine HCl 0.2 % SOLN Apply to eye. 04/17/16   Historical Provider, MD  oxyCODONE-acetaminophen (PERCOCET) 7.5-325 MG tablet Take 1 tablet by mouth every 4 (four) hours as needed for severe pain. 05/13/16 05/13/17  Joni Reining, PA-C  potassium chloride (K-DUR) 10 MEQ tablet Take one potassium for each Furosemide 20 you take 01/25/14   Historical Provider, MD  potassium chloride (K-DUR) 10 MEQ tablet Take one potassium for each Furosemide 20 you take 04/17/16 04/18/17  Historical Provider, MD    Allergies Eggs or egg-derived products; Morphine and related; Spiriva handihaler [tiotropium bromide monohydrate]; Sulfa antibiotics; Penicillins; and Voltaren [diclofenac]  Family History  Problem Relation Age of Onset  . Heart disease Mother   . Hypertension Mother   . Hyperlipidemia Mother   . Diabetes Brother     Social History Social History  Substance Use Topics  . Smoking status: Former Smoker    Types: Cigarettes  . Smokeless tobacco: Never Used  . Alcohol use No    Review of Systems Constitutional: No fever/chills Eyes: No visual changes. ENT: No sore throat. Cardiovascular: Denies chest pain. Respiratory: Denies shortness of breath. Gastrointestinal: No abdominal pain.  No nausea, no vomiting.  No diarrhea.  No constipation. Genitourinary: Negative for dysuria. +urinary incontinence Musculoskeletal: + back pain with radiation to legs. Skin: Negative for rash. Neurological: Negative for headaches, focal weakness or numbness. 10-point ROS otherwise negative.  ____________________________________________   PHYSICAL EXAM:  VITAL SIGNS: ED Triage Vitals [05/13/16 1305]  Enc Vitals Group     BP 120/65     Pulse Rate 83      Resp 18     Temp 98.3 F (36.8 C)     Temp Source Oral     SpO2 97 %     Weight 210 lb (95.3 kg)     Height 4\' 11"  (1.499 m)     Head Circumference      Peak Flow      Pain Score 9     Pain Loc      Pain Edu?      Excl. in GC?    Exam Deferred    ____________________________________________   LABS (all labs ordered are listed, but only abnormal results are displayed)  Labs Reviewed - No data to display ____________________________________________  EKG   ____________________________________________  RADIOLOGY   ____________________________________________   PROCEDURES  Procedure(s) performed: None  Procedures  Critical Care performed: No  ____________________________________________   INITIAL IMPRESSION / ASSESSMENT AND PLAN / ED COURSE  Pertinent labs & imaging results that were available during my care of the patient were reviewed by me and considered in my medical decision making (see chart for details).  Final stenosis  L4-L5. Encouraged patient to follow-up with scheduled facet block in 2 days. Advised patient neurosurgeon office is closed over the holiday into traffic today and tomorrow to contact him to schedule an appointment. Patient also advised to follow-up with urology as directed. Patient medications or refills for 5 days.  Clinical Course     ____________________________________________   FINAL CLINICAL IMPRESSION(S) / ED DIAGNOSES  Final diagnoses:  Spinal stenosis at L4-L5 level   Spinal stenosis   NEW MEDICATIONS STARTED DURING THIS VISIT:  New Prescriptions   DIAZEPAM (VALIUM) 5 MG TABLET    Take 1 tablet (5 mg total) by mouth every 8 (eight) hours as needed for anxiety.   OXYCODONE-ACETAMINOPHEN (PERCOCET) 7.5-325 MG TABLET    Take 1 tablet by mouth every 4 (four) hours as needed for severe pain.     Note:  This document was prepared using Dragon voice recognition software and may include unintentional dictation  errors.    Joni Reining, PA-C 05/13/16 1422    Jene Every, MD 05/13/16 [id number]

## 2016-05-13 NOTE — Telephone Encounter (Signed)
Spoke with patient, she was seen in ED on last Wednesday after lifting a patient and had a CT scan and MRI. Referral has been made to Dr Lovell SheehanJenkins/ neuro surgeon which has not called her yet for appt.  She has an appt for facet block on Thursday and is not sure if she should keep the appt.  Is still having severe pain and would like some help from Dr Pernell Dupreadams if appropriate.  Is taking vicodin and valium but it is not helping.

## 2016-05-14 NOTE — Telephone Encounter (Signed)
Patient advised it is ok to see Dr. Pernell DupreAdams tomorrow.

## 2016-05-15 ENCOUNTER — Ambulatory Visit: Payer: BLUE CROSS/BLUE SHIELD | Attending: Anesthesiology | Admitting: Anesthesiology

## 2016-05-15 ENCOUNTER — Encounter: Payer: Self-pay | Admitting: Anesthesiology

## 2016-05-15 DIAGNOSIS — M5431 Sciatica, right side: Secondary | ICD-10-CM

## 2016-05-15 DIAGNOSIS — F419 Anxiety disorder, unspecified: Secondary | ICD-10-CM | POA: Insufficient documentation

## 2016-05-15 DIAGNOSIS — M47812 Spondylosis without myelopathy or radiculopathy, cervical region: Secondary | ICD-10-CM

## 2016-05-15 DIAGNOSIS — M1288 Other specific arthropathies, not elsewhere classified, other specified site: Secondary | ICD-10-CM | POA: Insufficient documentation

## 2016-05-15 DIAGNOSIS — M5136 Other intervertebral disc degeneration, lumbar region: Secondary | ICD-10-CM | POA: Diagnosis not present

## 2016-05-15 DIAGNOSIS — M4692 Unspecified inflammatory spondylopathy, cervical region: Secondary | ICD-10-CM | POA: Diagnosis not present

## 2016-05-15 DIAGNOSIS — M545 Low back pain: Secondary | ICD-10-CM | POA: Insufficient documentation

## 2016-05-15 DIAGNOSIS — F1120 Opioid dependence, uncomplicated: Secondary | ICD-10-CM | POA: Diagnosis not present

## 2016-05-15 DIAGNOSIS — M25551 Pain in right hip: Secondary | ICD-10-CM | POA: Diagnosis present

## 2016-05-15 MED ORDER — HYDROCODONE-ACETAMINOPHEN 5-325 MG PO TABS: 1.0000 | ORAL_TABLET | Freq: Four times a day (QID) | ORAL | 0 refills | Status: DC | PRN

## 2016-05-15 MED ORDER — TRIAMCINOLONE ACETONIDE 40 MG/ML IJ SUSP
INTRAMUSCULAR | Status: AC
  Filled 2016-05-15: qty 2

## 2016-05-15 MED ORDER — FENTANYL CITRATE (PF) 100 MCG/2ML IJ SOLN
INTRAMUSCULAR | Status: AC
  Filled 2016-05-15: qty 2

## 2016-05-15 MED ORDER — ROPIVACAINE HCL 2 MG/ML IJ SOLN
INTRAMUSCULAR | Status: AC
  Filled 2016-05-15: qty 20

## 2016-05-15 MED ORDER — MIDAZOLAM HCL 5 MG/5ML IJ SOLN
INTRAMUSCULAR | Status: AC
  Filled 2016-05-15: qty 5

## 2016-05-15 NOTE — Progress Notes (Signed)
1527 Versed 1 mg IV

## 2016-05-15 NOTE — Progress Notes (Signed)
Safety precautions to be maintained throughout the outpatient stay will include: orient to surroundings, keep bed in low position, maintain call bell within reach at all times, provide assistance with transfer out of bed and ambulation.  

## 2016-05-15 NOTE — Patient Instructions (Signed)
Pain Management Discharge Instructions  General Discharge Instructions :  If you need to reach your doctor call: Monday-Friday 8:00 am - 4:00 pm at [phone] or toll free [phone].  After clinic hours [phone] to have operator reach doctor.  Bring all of your medication bottles to all your appointments in the pain clinic.  To cancel or reschedule your appointment with Pain Management please remember to call 24 hours in advance to avoid a fee.  Refer to the educational materials which you have been given on: General Risks, I had my Procedure. Discharge Instructions, Post Sedation.  Post Procedure Instructions:  The drugs you were given will stay in your system until tomorrow, so for the next 24 hours you should not drive, make any legal decisions or drink any alcoholic beverages.  You may eat anything you prefer, but it is better to start with liquids then soups and crackers, and gradually work up to solid foods.  Please notify your doctor immediately if you have any unusual bleeding, trouble breathing or pain that is not related to your normal pain.  Depending on the type of procedure that was done, some parts of your body may feel week and/or numb.  This usually clears up by tonight or the next day.  Walk with the use of an assistive device or accompanied by an adult for the 24 hours.  You may use ice on the affected area for the first 24 hours.  Put ice in a Ziploc bag and cover with a towel and place against area 15 minutes on 15 minutes off.  You may switch to heat after 24 hours.GENERAL RISKS AND COMPLICATIONS  What are the risk, side effects and possible complications? Generally speaking, most procedures are safe.  However, with any procedure there are risks, side effects, and the possibility of complications.  The risks and complications are dependent upon the sites that are lesioned, or the type of nerve block to be performed.  The closer the procedure is to the spine,  the more serious the risks are.  Great care is taken when placing the radio frequency needles, block needles or lesioning probes, but sometimes complications can occur. 1. Infection: Any time there is an injection through the skin, there is a risk of infection.  This is why sterile conditions are used for these blocks.  There are four possible types of infection. 1. Localized skin infection. 2. Central Nervous System Infection-This can be in the form of Meningitis, which can be deadly. 3. Epidural Infections-This can be in the form of an epidural abscess, which can cause pressure inside of the spine, causing compression of the spinal cord with subsequent paralysis. This would require an emergency surgery to decompress, and there are no guarantees that the patient would recover from the paralysis. 4. Discitis-This is an infection of the intervertebral discs.  It occurs in about 1% of discography procedures.  It is difficult to treat and it may lead to surgery.        2. Pain: the needles have to go through skin and soft tissues, will cause soreness.       3. Damage to internal structures:  The nerves to be lesioned may be near blood vessels or    other nerves which can be potentially damaged.       4. Bleeding: Bleeding is more common if the patient is taking blood thinners such as  aspirin, Coumadin, Ticiid, Plavix, etc., or if he/she have some genetic predisposition  such as   hemophilia. Bleeding into the spinal canal can cause compression of the spinal  cord with subsequent paralysis.  This would require an emergency surgery to  decompress and there are no guarantees that the patient would recover from the  paralysis.       5. Pneumothorax:  Puncturing of a lung is a possibility, every time a needle is introduced in  the area of the chest or upper back.  Pneumothorax refers to free air around the  collapsed lung(s), inside of the thoracic cavity (chest cavity).  Another two possible  complications  related to a similar event would include: Hemothorax and Chylothorax.   These are variations of the Pneumothorax, where instead of air around the collapsed  lung(s), you may have blood or chyle, respectively.       6. Spinal headaches: They may occur with any procedures in the area of the spine.       7. Persistent CSF (Cerebro-Spinal Fluid) leakage: This is a rare problem, but may occur  with prolonged intrathecal or epidural catheters either due to the formation of a fistulous  track or a dural tear.       8. Nerve damage: By working so close to the spinal cord, there is always a possibility of  nerve damage, which could be as serious as a permanent spinal cord injury with  paralysis.       9. Death:  Although rare, severe deadly allergic reactions known as "Anaphylactic  reaction" can occur to any of the medications used.      10. Worsening of the symptoms:  We can always make thing worse.  What are the chances of something like this happening? Chances of any of this occuring are extremely low.  By statistics, you have more of a chance of getting killed in a motor vehicle accident: while driving to the hospital than any of the above occurring .  Nevertheless, you should be aware that they are possibilities.  In general, it is similar to taking a shower.  Everybody knows that you can slip, hit your head and get killed.  Does that mean that you should not shower again?  Nevertheless always keep in mind that statistics do not mean anything if you happen to be on the wrong side of them.  Even if a procedure has a 1 (one) in a 1,000,000 (million) chance of going wrong, it you happen to be that one..Also, keep in mind that by statistics, you have more of a chance of having something go wrong when taking medications.  Who should not have this procedure? If you are on a blood thinning medication (e.g. Coumadin, Plavix, see list of "Blood Thinners"), or if you have an active infection going on, you should not  have the procedure.  If you are taking any blood thinners, please inform your physician.  How should I prepare for this procedure?  Do not eat or drink anything at least six hours prior to the procedure.  Bring a driver with you .  It cannot be a taxi.  Come accompanied by an adult that can drive you back, and that is strong enough to help you if your legs get weak or numb from the local anesthetic.  Take all of your medicines the morning of the procedure with just enough water to swallow them.  If you have diabetes, make sure that you are scheduled to have your procedure done first thing in the morning, whenever possible.  If you have diabetes,   take only half of your insulin dose and notify our nurse that you have done so as soon as you arrive at the clinic.  If you are diabetic, but only take blood sugar pills (oral hypoglycemic), then do not take them on the morning of your procedure.  You may take them after you have had the procedure.  Do not take aspirin or any aspirin-containing medications, at least eleven (11) days prior to the procedure.  They may prolong bleeding.  Wear loose fitting clothing that may be easy to take off and that you would not mind if it got stained with Betadine or blood.  Do not wear any jewelry or perfume  Remove any nail coloring.  It will interfere with some of our monitoring equipment.  NOTE: Remember that this is not meant to be interpreted as a complete list of all possible complications.  Unforeseen problems may occur.  BLOOD THINNERS The following drugs contain aspirin or other products, which can cause increased bleeding during surgery and should not be taken for 2 weeks prior to and 1 week after surgery.  If you should need take something for relief of minor pain, you may take acetaminophen which is found in Tylenol,m Datril, Anacin-3 and Panadol. It is not blood thinner. The products listed below are.  Do not take any of the products listed below  in addition to any listed on your instruction sheet.  A.P.C or A.P.C with Codeine Codeine Phosphate Capsules #3 Ibuprofen Ridaura  ABC compound Congesprin Imuran rimadil  Advil Cope Indocin Robaxisal  Alka-Seltzer Effervescent Pain Reliever and Antacid Coricidin or Coricidin-D  Indomethacin Rufen  Alka-Seltzer plus Cold Medicine Cosprin Ketoprofen S-A-C Tablets  Anacin Analgesic Tablets or Capsules Coumadin Korlgesic Salflex  Anacin Extra Strength Analgesic tablets or capsules CP-2 Tablets Lanoril Salicylate  Anaprox Cuprimine Capsules Levenox Salocol  Anexsia-D Dalteparin Magan Salsalate  Anodynos Darvon compound Magnesium Salicylate Sine-off  Ansaid Dasin Capsules Magsal Sodium Salicylate  Anturane Depen Capsules Marnal Soma  APF Arthritis pain formula Dewitt's Pills Measurin Stanback  Argesic Dia-Gesic Meclofenamic Sulfinpyrazone  Arthritis Bayer Timed Release Aspirin Diclofenac Meclomen Sulindac  Arthritis pain formula Anacin Dicumarol Medipren Supac  Analgesic (Safety coated) Arthralgen Diffunasal Mefanamic Suprofen  Arthritis Strength Bufferin Dihydrocodeine Mepro Compound Suprol  Arthropan liquid Dopirydamole Methcarbomol with Aspirin Synalgos  ASA tablets/Enseals Disalcid Micrainin Tagament  Ascriptin Doan's Midol Talwin  Ascriptin A/D Dolene Mobidin Tanderil  Ascriptin Extra Strength Dolobid Moblgesic Ticlid  Ascriptin with Codeine Doloprin or Doloprin with Codeine Momentum Tolectin  Asperbuf Duoprin Mono-gesic Trendar  Aspergum Duradyne Motrin or Motrin IB Triminicin  Aspirin plain, buffered or enteric coated Durasal Myochrisine Trigesic  Aspirin Suppositories Easprin Nalfon Trillsate  Aspirin with Codeine Ecotrin Regular or Extra Strength Naprosyn Uracel  Atromid-S Efficin Naproxen Ursinus  Auranofin Capsules Elmiron Neocylate Vanquish  Axotal Emagrin Norgesic Verin  Azathioprine Empirin or Empirin with Codeine Normiflo Vitamin E  Azolid Emprazil Nuprin Voltaren  Bayer  Aspirin plain, buffered or children's or timed BC Tablets or powders Encaprin Orgaran Warfarin Sodium  Buff-a-Comp Enoxaparin Orudis Zorpin  Buff-a-Comp with Codeine Equegesic Os-Cal-Gesic   Buffaprin Excedrin plain, buffered or Extra Strength Oxalid   Bufferin Arthritis Strength Feldene Oxphenbutazone   Bufferin plain or Extra Strength Feldene Capsules Oxycodone with Aspirin   Bufferin with Codeine Fenoprofen Fenoprofen Pabalate or Pabalate-SF   Buffets II Flogesic Panagesic   Buffinol plain or Extra Strength Florinal or Florinal with Codeine Panwarfarin   Buf-Tabs Flurbiprofen Penicillamine   Butalbital Compound Four-way cold tablets   Penicillin   Butazolidin Fragmin Pepto-Bismol   Carbenicillin Geminisyn Percodan   Carna Arthritis Reliever Geopen Persantine   Carprofen Gold's salt Persistin   Chloramphenicol Goody's Phenylbutazone   Chloromycetin Haltrain Piroxlcam   Clmetidine heparin Plaquenil   Cllnoril Hyco-pap Ponstel   Clofibrate Hydroxy chloroquine Propoxyphen         Before stopping any of these medications, be sure to consult the physician who ordered them.  Some, such as Coumadin (Warfarin) are ordered to prevent or treat serious conditions such as "deep thrombosis", "pumonary embolisms", and other heart problems.  The amount of time that you may need off of the medication may also vary with the medication and the reason for which you were taking it.  If you are taking any of these medications, please make sure you notify your pain physician before you undergo any procedures.         Facet Joint Block, Care After Refer to this sheet in the next few weeks. These instructions provide you with information on caring for yourself after your procedure. Your health care provider may also give you more specific instructions. Your treatment has been planned according to current medical practices, but problems sometimes occur. Call your health care provider if you have any problems  or questions after your procedure. HOME CARE INSTRUCTIONS   Keep track of the amount of pain relief you feel and how long it lasts.  Limit pain medicine within the first 4-6 hours after the procedure as directed by your health care provider.  Resume taking dietary supplements and medicines as directed by your health care provider.  You may resume your regular diet.  Do not apply heat near or over the injection site(s) for 24 hours.   Do not take a bath or soak in water (such as a pool or lake) for 24 hours.  Do not drive for 24 hours unless approved by your health care provider.  Avoid strenuous activity for 24 hours.  Remove your bandages the morning after the procedure.   If the injection site is tender, applying an ice pack may relieve some tenderness. To do this:  Put ice in a bag.  Place a towel between your skin and the bag.  Leave the ice on for 15-20 minutes, 3-4 times a day.  Keep follow-up appointments as directed by your health care provider. SEEK MEDICAL CARE IF:   Your pain is not controlled by your medicines.   There is drainage from the injection site.   There is significant bleeding or swelling at the injection site.  You have diabetes and your blood sugar is above 180 mg/dL. SEEK IMMEDIATE MEDICAL CARE IF:   You develop a fever of 101F (38.3C) or greater.   You have worsening pain or swelling around the injection site.   You have red streaking around the injection site.   You develop severe pain that is not controlled by your medicines.   You develop a headache, stiff neck, nausea, or vomiting.   Your eyes become very sensitive to light.   You have weakness, paralysis, or tingling in your arms or legs that was not present before the procedure.   You develop difficulty urinating or breathing.    This information is not intended to replace advice given to you by your health care provider. Make sure you discuss any questions you  have with your health care provider.   Document Released: 08/11/2012 Document Revised: 09/15/2014 Document Reviewed: 08/11/2012 Elsevier Interactive Patient Education 2016   [employer] Facet Joint Block The facet joints connect the bones of the spine (vertebrae). They make it possible for you to bend, twist, and make other movements with your spine. They also prevent you from overbending, overtwisting, and making other excessive movements.  A facet joint block is a procedure where a numbing medicine (anesthetic) is injected into a facet joint. Often, a type of anti-inflammatory medicine called a steroid is also injected. A facet joint block may be done for two reasons:   Diagnosis. A facet joint block may be done as a test to see whether neck or back pain is caused by a worn-down or infected facet joint. If the pain gets better after a facet joint block, it means the pain is probably coming from the facet joint. If the pain does not get better, it means the pain is probably not coming from the facet joint.   Therapy. A facet joint block may be done to relieve neck or back pain caused by a facet joint. A facet joint block is only done as a therapy if the pain does not improve with medicine, exercise programs, physical therapy, and other forms of pain management. LET YOUR HEALTH CARE PROVIDER KNOW ABOUT:   Any allergies you have.   All medicines you are taking, including vitamins, herbs, eyedrops, and over-the-counter medicines and creams.   Previous problems you or members of your family have had with the use of anesthetics.   Any blood disorders you have had.   Other health problems you have. RISKS AND COMPLICATIONS Generally, having a facet joint block is safe. However, as with any procedure, complications can occur. Possible complications associated with having a facet joint block include:   Bleeding.   Injury to a nerve near the injection site.   Pain at the injection site.    Weakness or numbness in areas controlled by nerves near the injection site.   Infection.   Temporary fluid retention.   Allergic reaction to anesthetics or medicines used during the procedure. BEFORE THE PROCEDURE   Follow your health care provider's instructions if you are taking dietary supplements or medicines. You may need to stop taking them or reduce your dosage.   Do not take any new dietary supplements or medicines without asking your health care provider first.   Follow your health care provider's instructions about eating and drinking before the procedure. You may need to stop eating and drinking several hours before the procedure.   Arrange to have an adult drive you home after the procedure. PROCEDURE  You may need to remove your clothing and dress in an open-back gown so that your health care provider can access your spine.   The procedure will be done while you are lying on an X-ray table. Most of the time you will be asked to lie on your stomach, but you may be asked to lie in a different position if an injection will be made in your neck.   Special machines will be used to monitor your oxygen levels, heart rate, and blood pressure.   If an injection will be made in your neck, an intravenous (IV) tube will be inserted into one of your veins. Fluids and medicine will flow directly into your body through the IV tube.   The area over the facet joint where the injection will be made will be cleaned with an antiseptic soap. The surrounding skin will be covered with sterile drapes.   An anesthetic will be applied to   your skin to make the injection area numb. You may feel a temporary stinging or burning sensation.   A video X-ray machine will be used to locate the joint. A contrast dye may be injected into the facet joint area to help with locating the joint.   When the joint is located, an anesthetic medicine will be injected into the joint through the  needle.   Your health care provider will ask you whether you feel pain relief. If you do feel relief, a steroid may be injected to provide pain relief for a longer period of time. If you do not feel relief or feel only partial relief, additional injections of an anesthetic may be made in other facet joints.   The needle will be removed, the skin will be cleansed, and bandages will be applied.  AFTER THE PROCEDURE   You will be observed for 15-30 minutes before being allowed to go home. Do not drive. Have an adult drive you or take a taxi or public transportation instead.   If you feel pain relief, the pain will return in several hours or days when the anesthetic wears off.   You may feel pain relief 2-14 days after the procedure. The amount of time this relief lasts varies from person to person.   It is normal to feel some tenderness over the injected area(s) for 2 days following the procedure.   If you have diabetes, you may have a temporary increase in blood sugar.   This information is not intended to replace advice given to you by your health care provider. Make sure you discuss any questions you have with your health care provider.   Document Released: 01/14/2007 Document Revised: 09/15/2014 Document Reviewed: 06/14/2012 Elsevier Interactive Patient Education 2016 [employer]  

## 2016-05-16 NOTE — Telephone Encounter (Signed)
Post procedure phone call.   No answer.  

## 2016-05-19 MED ORDER — LIDOCAINE HCL (PF) 1 % IJ SOLN: 5.0000 mL | Freq: Once | INTRAMUSCULAR | Status: DC

## 2016-05-19 MED ORDER — TRIAMCINOLONE ACETONIDE 40 MG/ML IJ SUSP: 40.0000 mg | Freq: Once | INTRAMUSCULAR | Status: DC

## 2016-05-19 MED ORDER — LACTATED RINGERS IV SOLN: 1000.0000 mL | INTRAVENOUS | Status: DC

## 2016-05-19 MED ORDER — ROPIVACAINE HCL 2 MG/ML IJ SOLN: 10.0000 mL | Freq: Once | INTRAMUSCULAR | Status: DC

## 2016-05-19 MED ORDER — MIDAZOLAM HCL 5 MG/5ML IJ SOLN: 5.0000 mg | Freq: Once | INTRAMUSCULAR | Status: DC

## 2016-05-19 NOTE — Progress Notes (Signed)
Chief complaint: Severe low back pain with radiation into the right hip and buttock region. Procedure: Bilateral lumbar medial branch block to the facet at L3-4 L4-5 L5-S1 and S1 under fluoroscopic guidance with moderate sedation Previous Procedure: Right Side lumbar facet medial branch block at L2-3 and L3-4 L4-5 L5-S1 and S1 under fluoroscopic guidance with moderate sedation  History of present illness:Madison [name] for reevaluation today. She has had some recurrence of the same pain that prompted the facet injections in the past. She did very well with these and got several months of relief. Otherwise she is doing reasonably well taking her medications as prescribed and needs a refill today. She's been cooperative with her regimen and compliant. No evidence of diverting or illicit use is noted. She presents today requesting repeat facet block for pain relief. No other changes in her symptom complex are noted.  . Current Outpatient Prescriptions:  .  atorvastatin (LIPITOR) 20 MG tablet, Take by mouth., Disp: , Rfl:  .  atorvastatin (LIPITOR) 20 MG tablet, Take by mouth., Disp: , Rfl:  .  carisoprodol (SOMA) 350 MG tablet, Take 1 tablet by mouth Twice daily as needed if not using Robaxin, Disp: , Rfl:  .  cyanocobalamin (V-R VITAMIN B-12) 500 MCG tablet, Take by mouth., Disp: , Rfl:  .  diazepam (VALIUM) 5 MG tablet, Take 1 tablet (5 mg total) by mouth every 8 (eight) hours as needed for anxiety., Disp: 30 tablet, Rfl: 0 .  diazepam (VALIUM) 5 MG tablet, Take 1 tablet (5 mg total) by mouth every 8 (eight) hours as needed for anxiety., Disp: 30 tablet, Rfl: 0 .  DULoxetine (CYMBALTA) 30 MG capsule, Take 30 mg in am and 60 mg at night., Disp: , Rfl:  .  fluticasone (FLONASE) 50 MCG/ACT nasal spray, , Disp: , Rfl:  .  fluticasone (FLONASE) 50 MCG/ACT nasal spray, Place into the nose., Disp: , Rfl:  .  furosemide (LASIX) 20 MG tablet, One daily, may increase to two to three daily prn for  edema, Disp: , Rfl:  .  furosemide (LASIX) 20 MG tablet, One daily, may increase to two to three daily prn for edema, Disp: , Rfl:  .  HYDROcodone-acetaminophen (NORCO/VICODIN) 5-325 MG tablet, Take 1 tablet by mouth every 6 (six) hours as needed for moderate pain., Disp: 90 tablet, Rfl: 0 .  loratadine-pseudoephedrine (CLARITIN-D 12-HOUR) 5-120 MG per tablet, Take by mouth., Disp: , Rfl:  .  methocarbamol (ROBAXIN) 750 MG tablet, Take 1 tablet (750 mg total) by mouth 3 (three) times daily., Disp: 90 tablet, Rfl: 5 .  metoprolol (LOPRESSOR) 50 MG tablet, , Disp: , Rfl:  .  naproxen (NAPROSYN) 500 MG tablet, , Disp: , Rfl:  .  Olopatadine HCl (PATADAY) 0.2 % SOLN, Apply to eye., Disp: , Rfl:  .  Olopatadine HCl 0.2 % SOLN, Apply to eye., Disp: , Rfl:  .  oxyCODONE-acetaminophen (PERCOCET) 7.5-325 MG tablet, Take 1 tablet by mouth every 4 (four) hours as needed for severe pain., Disp: 20 tablet, Rfl: 0 .  potassium chloride (K-DUR) 10 MEQ tablet, Take one potassium for each Furosemide 20 you take, Disp: , Rfl:  .  potassium chloride (K-DUR) 10 MEQ tablet, Take one potassium for each Furosemide 20 you take, Disp: , Rfl:  .  DULoxetine (CYMBALTA) 30 MG capsule, Take 30 mg in am and 60 mg at night., Disp: , Rfl:  .  fluticasone (FLONASE) 50 MCG/ACT nasal spray, Place into the nose., Disp: , Rfl:  .  metoprolol (LOPRESSOR) 50 MG tablet, Take by mouth., Disp: , Rfl:   BP 127/60   Pulse 72   Temp (!) 96.6 F (35.9 C)   Resp 17   Ht 4\' 11"  (1.499 m)   Wt 215 lb (97.5 kg)   SpO2 92%   BMI 43.42 kg/m   Patient Active Problem List   Diagnosis Date Noted  . Facet arthritis of cervical region (HCC) 03/05/2015  . DDD (degenerative disc disease), lumbar 03/05/2015    Allergies  Allergen Reactions  . Eggs Or Egg-Derived Products Anaphylaxis    Egg albumin  . Morphine And Related   . Spiriva Handihaler [Tiotropium Bromide Monohydrate] Other (See Comments)    bronchiospasms  . Sulfa Antibiotics  Hives  . Penicillins Rash  . Voltaren [Diclofenac] Rash      Physical exam: Patient is alert and oriented 3 and cooperative and compliant.  Heart is regular rate and rhythm without murmur  Lungs are clear to auscultation  Inspection of the low back reveals significant paraspinous muscle tenderness but no overt trigger points with worsening of pain on extension with lateral rotation to the right side causing radiation of pain to the ipsilateral hip and buttock region. Strength appears to be a baseline with no change in sensory or motor function.  Assessment:  #1 bilateral  facet arthropathy  #2 degenerative disc disease  #3 myofascial low back pain  #4 chronic opioid management  Plan:  #1 we will have her return to clinic in 2 months for reevaluation possible repeat bilateral facet block at that time. We've gone over the risks and benefits of today's procedure with all questions answered and no guarantees were made. We'll have her continue her current medication management in the meantime..  #2 continue with back stretching strengthening exercises as tolerated and I have reiterated that this is important to help reduce spasming in the lower back.  #3 we will have her return to clinic in 2 months. We'll also have her continue the current medications with the Vicodin at a twice a day to 3 times a day dosing regimen. We have gone over the risks and benefits of chronic opioid administration and the need to minimize usage to avoid possible long-term side effects once again..  Procedure: Bilateral facet block at L 34 L4-5 L5-S1 and S1 under fluoroscopic guidance with moderate sedation Procedure: Bilateral lumbar medial branch block under fluoroscopic guidance at L3-4 L4-5 L5-S1 with sedation Patient was taken to the fluoroscopy suite and placed in the prone position. A total dose of 1 mg of Versed with 0 cc of fentanyl was titrated for moderate sedation. Vital signs were stable  throughout the procedure. The  overlying area of skin on the  left side was prepped with Betadine 3 and strict aseptic technique was utilized throughout the procedure. Flouroscopy was used to  identify the areas overlying the aforementioned facets at  L3-4 or L4-5 and  L5-S1. 1% lidocaine 1 cc was infiltrated subcutaneously and into the fascia with a 25-gauge needle at each of these sites. I then advanced a 22-gauge 3-1/2 inch Quinckie needle with the needle tip to lie at the "[facility] portion of the [employer]"" Scotty dog". There was negative aspiration for heme or CSF and  no paresthesia. I then injected 2 cc of ropivacaine 0.2% mixed with 10 mg of triamcinolone at each of the aforementioned sites. These needles were withdrawn and the L5-S1 needle was redirected towards the S1 posterior foramen. This was once again  performed with no paresthesia, negative aspiration and 2 cc of this same mixture was injected at this site.   On the opposite side, I then injected 1% lidocaine as above and advanced 22-gauge Quinky needles as before to the [place] eye portion of the [employer] dog". These needles were placed without paresthesia and with strict aseptic technique. As before I injected 2 cc of ropivacaine .2% mixed with 10 mg triamcinolone at each site without evidence of IV or subarachnoid symptoms. These needles were withdrawn and the L5-S1 needle was redirected towards the S1 posterior foramen and advanced without paresthesia with negative aspiration. Once again, I injected 2 cc of the same mixture at that site and the patient was convalesced discharged home in stable condition   Dr. Gwenyth Bender MD

## 2016-05-22 ENCOUNTER — Ambulatory Visit: Payer: BLUE CROSS/BLUE SHIELD | Admitting: Anesthesiology

## 2016-05-22 DIAGNOSIS — N189 Chronic kidney disease, unspecified: Secondary | ICD-10-CM

## 2016-05-22 HISTORY — DX: Chronic kidney disease, unspecified: N18.9

## 2016-05-26 ENCOUNTER — Inpatient Hospital Stay (HOSPITAL_COMMUNITY): Payer: BLUE CROSS/BLUE SHIELD

## 2016-05-26 ENCOUNTER — Observation Stay (HOSPITAL_COMMUNITY)
Admission: AD | Admit: 2016-05-26 | Discharge: 2016-05-27 | DRG: 460 | Disposition: A | Discharge status: Home / Self Care | Payer: BLUE CROSS/BLUE SHIELD | Source: Ambulatory Visit | Attending: Neurosurgery | Admitting: Neurosurgery

## 2016-05-26 ENCOUNTER — Inpatient Hospital Stay (HOSPITAL_COMMUNITY): Payer: BLUE CROSS/BLUE SHIELD | Admitting: Anesthesiology

## 2016-05-26 ENCOUNTER — Encounter (HOSPITAL_COMMUNITY): Payer: Self-pay | Admitting: *Deleted

## 2016-05-26 ENCOUNTER — Encounter (HOSPITAL_COMMUNITY)
Admission: AD | Disposition: A | Discharge status: Home / Self Care | Payer: Self-pay | Source: Ambulatory Visit | Attending: Neurosurgery

## 2016-05-26 ENCOUNTER — Emergency Department (HOSPITAL_COMMUNITY)
Admission: EM | Admit: 2016-05-26 | Discharge: 2016-05-26 | Disposition: A | Discharge status: Home / Self Care | Payer: BLUE CROSS/BLUE SHIELD | Source: Home / Self Care

## 2016-05-26 DIAGNOSIS — Z419 Encounter for procedure for purposes other than remedying health state, unspecified: Secondary | ICD-10-CM

## 2016-05-26 DIAGNOSIS — J302 Other seasonal allergic rhinitis: Secondary | ICD-10-CM | POA: Diagnosis not present

## 2016-05-26 DIAGNOSIS — M4806 Spinal stenosis, lumbar region: Secondary | ICD-10-CM | POA: Diagnosis not present

## 2016-05-26 DIAGNOSIS — M5116 Intervertebral disc disorders with radiculopathy, lumbar region: Secondary | ICD-10-CM | POA: Diagnosis not present

## 2016-05-26 DIAGNOSIS — Z79899 Other long term (current) drug therapy: Secondary | ICD-10-CM | POA: Insufficient documentation

## 2016-05-26 DIAGNOSIS — Z6841 Body Mass Index (BMI) 40.0 and over, adult: Secondary | ICD-10-CM | POA: Insufficient documentation

## 2016-05-26 DIAGNOSIS — G834 Cauda equina syndrome: Secondary | ICD-10-CM | POA: Diagnosis not present

## 2016-05-26 DIAGNOSIS — Z791 Long term (current) use of non-steroidal anti-inflammatories (NSAID): Secondary | ICD-10-CM | POA: Diagnosis not present

## 2016-05-26 DIAGNOSIS — F17210 Nicotine dependence, cigarettes, uncomplicated: Secondary | ICD-10-CM | POA: Insufficient documentation

## 2016-05-26 DIAGNOSIS — M4316 Spondylolisthesis, lumbar region: Principal | ICD-10-CM | POA: Diagnosis present

## 2016-05-26 DIAGNOSIS — E785 Hyperlipidemia, unspecified: Secondary | ICD-10-CM | POA: Insufficient documentation

## 2016-05-26 DIAGNOSIS — R338 Other retention of urine: Secondary | ICD-10-CM | POA: Diagnosis not present

## 2016-05-26 DIAGNOSIS — Z7951 Long term (current) use of inhaled steroids: Secondary | ICD-10-CM | POA: Insufficient documentation

## 2016-05-26 HISTORY — DX: Anemia, unspecified: D64.9

## 2016-05-26 HISTORY — DX: Peripheral vascular disease, unspecified: I73.9

## 2016-05-26 HISTORY — DX: Chronic kidney disease, unspecified: N18.9

## 2016-05-26 HISTORY — DX: Pneumonia, unspecified organism: J18.9

## 2016-05-26 HISTORY — DX: Localized edema: R60.0

## 2016-05-26 HISTORY — DX: Unspecified osteoarthritis, unspecified site: M19.90

## 2016-05-26 HISTORY — DX: Other seasonal allergic rhinitis: J30.2

## 2016-05-26 LAB — ABO/RH: ABO/RH(D): O POS

## 2016-05-26 LAB — BASIC METABOLIC PANEL
Anion gap: 6 (ref 5–15)
BUN: 26 mg/dL — ABNORMAL HIGH (ref 6–20)
CHLORIDE: 105 mmol/L (ref 101–111)
CO2: 26 mmol/L (ref 22–32)
Calcium: 9.5 mg/dL (ref 8.9–10.3)
Creatinine, Ser: 0.89 mg/dL (ref 0.44–1.00)
GFR calc non Af Amer: >60 mL/min (ref >60)
Glucose, Bld: 91 mg/dL (ref 65–99)
POTASSIUM: 4.9 mmol/L (ref 3.5–5.1)
SODIUM: 137 mmol/L (ref 135–145)

## 2016-05-26 LAB — CBC
HEMATOCRIT: 44.7 % (ref 36.0–46.0)
Hemoglobin: 14.4 g/dL (ref 12.0–15.0)
MCH: 30.4 pg (ref 26.0–34.0)
MCHC: 32.2 g/dL (ref 30.0–36.0)
MCV: 94.3 fL (ref 78.0–100.0)
Platelets: 221 10*3/uL (ref 150–400)
RBC: 4.74 MIL/uL (ref 3.87–5.11)
RDW: 13.4 % (ref 11.5–15.5)
WBC: 11 10*3/uL — AB (ref 4.0–10.5)

## 2016-05-26 LAB — TYPE AND SCREEN
ABO/RH(D): O POS
Antibody Screen: NEGATIVE

## 2016-05-26 SURGERY — POSTERIOR LUMBAR FUSION 1 LEVEL
Anesthesia: General | Site: Spine Lumbar

## 2016-05-26 MED ORDER — VANCOMYCIN HCL IN DEXTROSE 1-5 GM/200ML-% IV SOLN
INTRAVENOUS | Status: AC
  Filled 2016-05-26: qty 200

## 2016-05-26 MED ORDER — HYDROMORPHONE HCL 1 MG/ML IJ SOLN
INTRAMUSCULAR | Status: AC
  Administered 2016-05-26: 0.5 mg via INTRAVENOUS
  Filled 2016-05-26: qty 1

## 2016-05-26 MED ORDER — FLUTICASONE PROPIONATE 50 MCG/ACT NA SUSP
2.0000 | Freq: Every day | NASAL | Status: DC
  Filled 2016-05-26: qty 16

## 2016-05-26 MED ORDER — ROCURONIUM BROMIDE 100 MG/10ML IV SOLN
INTRAVENOUS | Status: DC | PRN
  Administered 2016-05-26: 70 mg via INTRAVENOUS
  Administered 2016-05-26: 30 mg via INTRAVENOUS

## 2016-05-26 MED ORDER — VANCOMYCIN HCL 1000 MG IV SOLR
INTRAVENOUS | Status: AC
  Filled 2016-05-26: qty 1000

## 2016-05-26 MED ORDER — VANCOMYCIN HCL 10 G IV SOLR
1500.0000 mg | Freq: Once | INTRAVENOUS | Status: AC
  Administered 2016-05-27: 1500 mg via INTRAVENOUS
  Filled 2016-05-26: qty 1500

## 2016-05-26 MED ORDER — DULOXETINE HCL 30 MG PO CPEP
60.0000 mg | ORAL_CAPSULE | Freq: Every evening | ORAL | Status: DC
  Administered 2016-05-26: 60 mg via ORAL
  Filled 2016-05-26: qty 2

## 2016-05-26 MED ORDER — BUPIVACAINE-EPINEPHRINE (PF) 0.5% -1:200000 IJ SOLN
INTRAMUSCULAR | Status: DC | PRN
  Administered 2016-05-26: 10 mL

## 2016-05-26 MED ORDER — FENTANYL CITRATE (PF) 100 MCG/2ML IJ SOLN
INTRAMUSCULAR | Status: DC | PRN
  Administered 2016-05-26 (×2): 50 ug via INTRAVENOUS
  Administered 2016-05-26: 100 ug via INTRAVENOUS
  Administered 2016-05-26 (×4): 50 ug via INTRAVENOUS

## 2016-05-26 MED ORDER — ONDANSETRON HCL 4 MG/2ML IJ SOLN: 4.0000 mg | INTRAMUSCULAR | Status: DC | PRN

## 2016-05-26 MED ORDER — LORATADINE-PSEUDOEPHEDRINE ER 5-120 MG PO TB12: 1.0000 | ORAL_TABLET | Freq: Every day | ORAL | Status: DC

## 2016-05-26 MED ORDER — ZOLPIDEM TARTRATE 5 MG PO TABS: 5.0000 mg | ORAL_TABLET | Freq: Every evening | ORAL | Status: DC | PRN

## 2016-05-26 MED ORDER — BISACODYL 10 MG RE SUPP
10.0000 mg | Freq: Every day | RECTAL | Status: DC | PRN
Start: 2016-05-26 — End: 2016-05-27

## 2016-05-26 MED ORDER — ONDANSETRON HCL 4 MG/2ML IJ SOLN
INTRAMUSCULAR | Status: AC
  Filled 2016-05-26: qty 2

## 2016-05-26 MED ORDER — METOPROLOL TARTRATE 25 MG PO TABS
50.0000 mg | ORAL_TABLET | Freq: Two times a day (BID) | ORAL | Status: DC
  Administered 2016-05-26 – 2016-05-27 (×2): 50 mg via ORAL
  Filled 2016-05-26 (×2): qty 2

## 2016-05-26 MED ORDER — HYDROMORPHONE HCL 1 MG/ML IJ SOLN
0.2500 mg | INTRAMUSCULAR | Status: DC | PRN
  Administered 2016-05-26 (×2): 0.5 mg via INTRAVENOUS

## 2016-05-26 MED ORDER — LIDOCAINE 2% (20 MG/ML) 5 ML SYRINGE
INTRAMUSCULAR | Status: AC
  Filled 2016-05-26: qty 5

## 2016-05-26 MED ORDER — OLOPATADINE HCL 0.1 % OP SOLN
1.0000 [drp] | Freq: Two times a day (BID) | OPHTHALMIC | Status: DC
  Administered 2016-05-27: 1 [drp] via OPHTHALMIC
  Filled 2016-05-26: qty 5

## 2016-05-26 MED ORDER — LORATADINE 10 MG PO TABS
10.0000 mg | ORAL_TABLET | Freq: Every day | ORAL | Status: DC
  Administered 2016-05-27: 10 mg via ORAL
  Filled 2016-05-26: qty 1

## 2016-05-26 MED ORDER — DULOXETINE HCL 30 MG PO CPEP
30.0000 mg | ORAL_CAPSULE | ORAL | Status: DC
  Administered 2016-05-27: 30 mg via ORAL
  Filled 2016-05-26: qty 1

## 2016-05-26 MED ORDER — PROPOFOL 10 MG/ML IV BOLUS
INTRAVENOUS | Status: DC | PRN
Start: 2016-05-26 — End: 2016-05-26
  Administered 2016-05-26: 150 mg via INTRAVENOUS

## 2016-05-26 MED ORDER — LACTATED RINGERS IV SOLN: INTRAVENOUS | Status: DC

## 2016-05-26 MED ORDER — PHENYLEPHRINE HCL 10 MG/ML IJ SOLN
INTRAMUSCULAR | Status: DC | PRN
  Administered 2016-05-26: 30 ug/min via INTRAVENOUS

## 2016-05-26 MED ORDER — FENTANYL CITRATE (PF) 100 MCG/2ML IJ SOLN
INTRAMUSCULAR | Status: AC
  Filled 2016-05-26: qty 4

## 2016-05-26 MED ORDER — ALBUTEROL SULFATE HFA 108 (90 BASE) MCG/ACT IN AERS
INHALATION_SPRAY | RESPIRATORY_TRACT | Status: AC
  Filled 2016-05-26: qty 6.7

## 2016-05-26 MED ORDER — ONDANSETRON HCL 4 MG/2ML IJ SOLN
INTRAMUSCULAR | Status: DC | PRN
  Administered 2016-05-26: 4 mg via INTRAVENOUS

## 2016-05-26 MED ORDER — SODIUM CHLORIDE 0.9 % IR SOLN
Status: DC | PRN
  Administered 2016-05-26: 17:00:00

## 2016-05-26 MED ORDER — MIDAZOLAM HCL 5 MG/5ML IJ SOLN
INTRAMUSCULAR | Status: DC | PRN
  Administered 2016-05-26: 2 mg via INTRAVENOUS

## 2016-05-26 MED ORDER — OXYCODONE-ACETAMINOPHEN 5-325 MG PO TABS
1.0000 | ORAL_TABLET | ORAL | Status: DC | PRN
  Administered 2016-05-27 (×2): 2 via ORAL
  Filled 2016-05-26 (×2): qty 2

## 2016-05-26 MED ORDER — PHENOL 1.4 % MT LIQD: 1.0000 | OROMUCOSAL | Status: DC | PRN

## 2016-05-26 MED ORDER — THROMBIN 20000 UNITS EX SOLR
CUTANEOUS | Status: DC | PRN
  Administered 2016-05-26: 18:00:00 via TOPICAL

## 2016-05-26 MED ORDER — BUPIVACAINE LIPOSOME 1.3 % IJ SUSP
INTRAMUSCULAR | Status: DC | PRN
  Administered 2016-05-26: 20 mL

## 2016-05-26 MED ORDER — SUGAMMADEX SODIUM 200 MG/2ML IV SOLN
INTRAVENOUS | Status: AC
  Filled 2016-05-26: qty 2

## 2016-05-26 MED ORDER — PHENYLEPHRINE HCL 10 MG/ML IJ SOLN
INTRAMUSCULAR | Status: DC | PRN
  Administered 2016-05-26 (×3): 80 ug via INTRAVENOUS

## 2016-05-26 MED ORDER — PROPOFOL 10 MG/ML IV BOLUS
INTRAVENOUS | Status: AC
  Filled 2016-05-26: qty 20

## 2016-05-26 MED ORDER — ACETAMINOPHEN 650 MG RE SUPP: 650.0000 mg | RECTAL | Status: DC | PRN

## 2016-05-26 MED ORDER — HYDROCODONE-ACETAMINOPHEN 5-325 MG PO TABS
1.0000 | ORAL_TABLET | ORAL | Status: DC | PRN
  Administered 2016-05-26: 2 via ORAL
  Administered 2016-05-27 (×2): 1 via ORAL
  Filled 2016-05-26 (×2): qty 1

## 2016-05-26 MED ORDER — LIDOCAINE HCL (CARDIAC) 20 MG/ML IV SOLN
INTRAVENOUS | Status: DC | PRN
  Administered 2016-05-26: 100 mg via INTRAVENOUS

## 2016-05-26 MED ORDER — ALBUTEROL SULFATE HFA 108 (90 BASE) MCG/ACT IN AERS
INHALATION_SPRAY | RESPIRATORY_TRACT | Status: DC | PRN
  Administered 2016-05-26 (×5): 2 via RESPIRATORY_TRACT

## 2016-05-26 MED ORDER — MUPIROCIN 2 % EX OINT
1.0000 "application " | TOPICAL_OINTMENT | Freq: Once | CUTANEOUS | Status: AC
  Administered 2016-05-26: 1 via TOPICAL

## 2016-05-26 MED ORDER — DIAZEPAM 5 MG PO TABS
ORAL_TABLET | ORAL | Status: AC
  Administered 2016-05-27: 5 mg via ORAL
  Filled 2016-05-26: qty 1

## 2016-05-26 MED ORDER — ATORVASTATIN CALCIUM 20 MG PO TABS
40.0000 mg | ORAL_TABLET | Freq: Every day | ORAL | Status: DC
  Administered 2016-05-27: 40 mg via ORAL
  Filled 2016-05-26: qty 2

## 2016-05-26 MED ORDER — CHLORHEXIDINE GLUCONATE CLOTH 2 % EX PADS
6.0000 | MEDICATED_PAD | Freq: Every day | CUTANEOUS | Status: DC
  Administered 2016-05-27: 6 via TOPICAL

## 2016-05-26 MED ORDER — PROMETHAZINE HCL 25 MG/ML IJ SOLN: 6.2500 mg | INTRAMUSCULAR | Status: DC | PRN

## 2016-05-26 MED ORDER — LACTATED RINGERS IV SOLN
INTRAVENOUS | Status: DC
  Administered 2016-05-26 (×4): via INTRAVENOUS

## 2016-05-26 MED ORDER — 0.9 % SODIUM CHLORIDE (POUR BTL) OPTIME
TOPICAL | Status: DC | PRN
  Administered 2016-05-26: 1000 mL

## 2016-05-26 MED ORDER — DIAZEPAM 5 MG PO TABS
5.0000 mg | ORAL_TABLET | Freq: Four times a day (QID) | ORAL | Status: DC | PRN
  Administered 2016-05-26 – 2016-05-27 (×2): 5 mg via ORAL
  Filled 2016-05-26: qty 1

## 2016-05-26 MED ORDER — DOCUSATE SODIUM 100 MG PO CAPS
100.0000 mg | ORAL_CAPSULE | Freq: Two times a day (BID) | ORAL | Status: DC
  Administered 2016-05-26 – 2016-05-27 (×2): 100 mg via ORAL
  Filled 2016-05-26 (×2): qty 1

## 2016-05-26 MED ORDER — CYCLOBENZAPRINE HCL 10 MG PO TABS: 10.0000 mg | ORAL_TABLET | Freq: Three times a day (TID) | ORAL | Status: DC | PRN

## 2016-05-26 MED ORDER — MENTHOL 3 MG MT LOZG: 1.0000 | LOZENGE | OROMUCOSAL | Status: DC | PRN

## 2016-05-26 MED ORDER — MEPERIDINE HCL 25 MG/ML IJ SOLN: 6.2500 mg | INTRAMUSCULAR | Status: DC | PRN

## 2016-05-26 MED ORDER — SUGAMMADEX SODIUM 200 MG/2ML IV SOLN
INTRAVENOUS | Status: DC | PRN
  Administered 2016-05-26: 200 mg via INTRAVENOUS

## 2016-05-26 MED ORDER — BACITRACIN ZINC 500 UNIT/GM EX OINT
TOPICAL_OINTMENT | CUTANEOUS | Status: DC | PRN
  Administered 2016-05-26: 1 via TOPICAL

## 2016-05-26 MED ORDER — MUPIROCIN 2 % EX OINT
TOPICAL_OINTMENT | CUTANEOUS | Status: AC
  Filled 2016-05-26: qty 22

## 2016-05-26 MED ORDER — HYDROCODONE-ACETAMINOPHEN 5-325 MG PO TABS
ORAL_TABLET | ORAL | Status: AC
  Administered 2016-05-27: 1 via ORAL
  Filled 2016-05-26: qty 2

## 2016-05-26 MED ORDER — HYDROMORPHONE HCL 1 MG/ML IJ SOLN
0.5000 mg | INTRAMUSCULAR | Status: DC | PRN
  Administered 2016-05-26: 0.5 mg via INTRAVENOUS
  Filled 2016-05-26: qty 1

## 2016-05-26 MED ORDER — ALUM & MAG HYDROXIDE-SIMETH 200-200-20 MG/5ML PO SUSP: 30.0000 mL | Freq: Four times a day (QID) | ORAL | Status: DC | PRN

## 2016-05-26 MED ORDER — DM-GUAIFENESIN ER 30-600 MG PO TB12
1.0000 | ORAL_TABLET | Freq: Two times a day (BID) | ORAL | Status: DC
  Administered 2016-05-26 – 2016-05-27 (×2): 1 via ORAL
  Filled 2016-05-26 (×2): qty 1

## 2016-05-26 MED ORDER — MIDAZOLAM HCL 2 MG/2ML IJ SOLN
INTRAMUSCULAR | Status: AC
  Filled 2016-05-26: qty 2

## 2016-05-26 MED ORDER — BUPIVACAINE LIPOSOME 1.3 % IJ SUSP
20.0000 mL | INTRAMUSCULAR | Status: DC
  Filled 2016-05-26: qty 20

## 2016-05-26 MED ORDER — ALBUMIN HUMAN 5 % IV SOLN
INTRAVENOUS | Status: DC | PRN
  Administered 2016-05-26 (×2): via INTRAVENOUS

## 2016-05-26 MED ORDER — VANCOMYCIN HCL 1000 MG IV SOLR
INTRAVENOUS | Status: DC | PRN
  Administered 2016-05-26: 1000 mg via INTRAVENOUS

## 2016-05-26 MED ORDER — ACETAMINOPHEN 325 MG PO TABS: 650.0000 mg | ORAL_TABLET | ORAL | Status: DC | PRN

## 2016-05-26 MED ORDER — PSEUDOEPHEDRINE HCL ER 120 MG PO TB12
120.0000 mg | ORAL_TABLET | Freq: Every day | ORAL | Status: DC
  Administered 2016-05-27: 120 mg via ORAL
  Filled 2016-05-26: qty 1

## 2016-05-26 MED ORDER — SCOPOLAMINE 1 MG/3DAYS TD PT72
1.0000 | MEDICATED_PATCH | TRANSDERMAL | Status: DC
  Administered 2016-05-26: 1.5 mg via TRANSDERMAL
  Filled 2016-05-26 (×2): qty 1

## 2016-05-26 MED ORDER — VANCOMYCIN HCL 1000 MG IV SOLR
INTRAVENOUS | Status: DC | PRN
  Administered 2016-05-26: 1000 mg via TOPICAL

## 2016-05-26 MED ORDER — MUPIROCIN 2 % EX OINT
1.0000 "application " | TOPICAL_OINTMENT | Freq: Two times a day (BID) | CUTANEOUS | Status: DC
  Administered 2016-05-26 – 2016-05-27 (×2): 1 via NASAL

## 2016-05-26 SURGICAL SUPPLY — 57 items
BAG DECANTER FOR FLEXI CONT (MISCELLANEOUS) ×3 IMPLANT
BENZOIN TINCTURE PRP APPL 2/3 (GAUZE/BANDAGES/DRESSINGS) ×3 IMPLANT
BUR MATCHSTICK NEURO 3.0 LAGG (BURR) ×3 IMPLANT
BUR PRECISION FLUTE 6.0 (BURR) ×3 IMPLANT
CANISTER SUCT 3000ML PPV (MISCELLANEOUS) ×3 IMPLANT
CAP REVERE LOCKING (Cap) ×12 IMPLANT
CLOSURE WOUND 1/2 X4 (GAUZE/BANDAGES/DRESSINGS) ×1
CONT SPEC 4OZ CLIKSEAL STRL BL (MISCELLANEOUS) ×3 IMPLANT
COVER BACK TABLE 60X90IN (DRAPES) IMPLANT
COVER TABLE BACK 60X90 (DRAPES) ×3 IMPLANT
DRAPE C-ARM 42X72 X-RAY (DRAPES) ×6 IMPLANT
DRAPE HALF SHEET 40X57 (DRAPES) ×9 IMPLANT
DRAPE LAPAROTOMY 100X72X124 (DRAPES) ×3 IMPLANT
DRAPE POUCH INSTRU U-SHP 10X18 (DRAPES) ×3 IMPLANT
DRAPE SURG 17X23 STRL (DRAPES) ×12 IMPLANT
ELECT BLADE 4.0 EZ CLEAN MEGAD (MISCELLANEOUS) ×3
ELECT REM PT RETURN 9FT ADLT (ELECTROSURGICAL) ×3
ELECTRODE BLDE 4.0 EZ CLN MEGD (MISCELLANEOUS) ×1 IMPLANT
ELECTRODE REM PT RTRN 9FT ADLT (ELECTROSURGICAL) ×1 IMPLANT
EVACUATOR 1/8 PVC DRAIN (DRAIN) IMPLANT
GAUZE SPONGE 4X4 12PLY STRL (GAUZE/BANDAGES/DRESSINGS) ×3 IMPLANT
GAUZE SPONGE 4X4 16PLY XRAY LF (GAUZE/BANDAGES/DRESSINGS) ×3 IMPLANT
GLOVE BIO SURGEON STRL SZ 6.5 (GLOVE) ×6 IMPLANT
GLOVE BIO SURGEON STRL SZ8 (GLOVE) ×9 IMPLANT
GLOVE BIO SURGEON STRL SZ8.5 (GLOVE) ×9 IMPLANT
GLOVE BIO SURGEONS STRL SZ 6.5 (GLOVE) ×3
GLOVE ECLIPSE 9.0 STRL (GLOVE) ×3 IMPLANT
GLOVE INDICATOR 6.5 STRL GRN (GLOVE) ×9 IMPLANT
GOWN STRL REUS W/ TWL LRG LVL3 (GOWN DISPOSABLE) ×2 IMPLANT
GOWN STRL REUS W/ TWL XL LVL3 (GOWN DISPOSABLE) ×3 IMPLANT
GOWN STRL REUS W/TWL 2XL LVL3 (GOWN DISPOSABLE) IMPLANT
GOWN STRL REUS W/TWL LRG LVL3 (GOWN DISPOSABLE) ×4
GOWN STRL REUS W/TWL XL LVL3 (GOWN DISPOSABLE) ×6
KIT BASIN OR (CUSTOM PROCEDURE TRAY) ×3 IMPLANT
KIT ROOM TURNOVER OR (KITS) ×3 IMPLANT
NEEDLE HYPO 21X1.5 SAFETY (NEEDLE) IMPLANT
NEEDLE HYPO 22GX1.5 SAFETY (NEEDLE) ×3 IMPLANT
NS IRRIG 1000ML POUR BTL (IV SOLUTION) ×3 IMPLANT
PACK LAMINECTOMY NEURO (CUSTOM PROCEDURE TRAY) ×3 IMPLANT
PAD ARMBOARD 7.5X6 YLW CONV (MISCELLANEOUS) ×9 IMPLANT
PATTIES SURGICAL .5 X1 (DISPOSABLE) IMPLANT
ROD REVERE 6.35 40MM (Rod) ×6 IMPLANT
SCREW REVERE 6.35 6.5MMX45 (Screw) ×6 IMPLANT
SCREW REVERE 6.5X50MM (Screw) ×6 IMPLANT
SPACER ALTERA 10X31 8-12MM-8 (Spacer) ×3 IMPLANT
SPONGE LAP 4X18 X RAY DECT (DISPOSABLE) IMPLANT
SPONGE NEURO XRAY DETECT 1X3 (DISPOSABLE) IMPLANT
SPONGE SURGIFOAM ABS GEL 100 (HEMOSTASIS) ×3 IMPLANT
STRIP BIOACTIVE 20CC 25X100X8 (Miscellaneous) ×3 IMPLANT
STRIP CLOSURE SKIN 1/2X4 (GAUZE/BANDAGES/DRESSINGS) ×2 IMPLANT
SUT VIC AB 1 CT1 18XBRD ANBCTR (SUTURE) ×2 IMPLANT
SUT VIC AB 1 CT1 8-18 (SUTURE) ×4
SUT VIC AB 2-0 CP2 18 (SUTURE) ×6 IMPLANT
TAPE CLOTH SURG 4X10 WHT LF (GAUZE/BANDAGES/DRESSINGS) ×3 IMPLANT
TOWEL OR 17X24 6PK STRL BLUE (TOWEL DISPOSABLE) ×3 IMPLANT
TOWEL OR 17X26 10 PK STRL BLUE (TOWEL DISPOSABLE) ×3 IMPLANT
WATER STERILE IRR 1000ML POUR (IV SOLUTION) ×3 IMPLANT

## 2016-05-26 NOTE — Anesthesia Postprocedure Evaluation (Signed)
Anesthesia Post Note  Patient: [name]  Procedure(s) Performed: Procedure(s) (LRB): LUMBAR FOUR-FIVE POSTERIOR LUMBAR INTERBODY FUSION, INTERBODY PROSTHESIS, POSTERIOR LATERAL ARTHRODESIS, POSTERIOR NON-SEGMENTAL INSTRUMENTATION (N/A)  Patient location during evaluation: PACU Anesthesia Type: General Level of consciousness: awake Pain management: pain level controlled Vital Signs Assessment: post-procedure vital signs reviewed and stable Respiratory status: spontaneous breathing Cardiovascular status: stable Anesthetic complications: no    Last Vitals:  Vitals:   05/26/16 2032 05/26/16 2048  BP:  (!) 109/51  Pulse: 82 83  Resp: 14 16  Temp: 36.6 C 36.8 C    Last Pain:  Vitals:   05/26/16 2215  TempSrc:   PainSc: 6                  EDWARDS,Kamarrion Stfort

## 2016-05-26 NOTE — Op Note (Signed)
Brief history: The patient is a 65 year old white female who has complained of back, buttock, and leg pain consistent with neurogenic claudication. 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 The patient has also developed urinary retention requiring placement of a Foley catheter. I discussed the situation with the patient. I recommend a L4-5 decompression, instrumentation, and fusion. The patient has weighed the risks, benefits, and alternatives to surgery and decided to proceed with the operation.  Preoperative diagnosis: L4-5 spondylolisthesis, Degenerative disc disease, spinal stenosis compressing both the L4 and the L5 nerve roots as well as the cauda equina; lumbago; lumbar radiculopathy; urinary retention  Postoperative diagnosis: The same   Procedure: L4 laminectomy /foraminotomies to decompress the bilateral L4 and L5 nerve roots and the thecal sac (the work required to do this was in addition to the work required to do the posterior lumbar interbody fusion because of the patient's spinal stenosis, facet arthropathy. Etc. requiring a wide decompression of the nerve roots.); L4-5 transforaminal lumbar interbody fusion with local morselized autograft bone and Kinnex graft extender; insertion of interbody prosthesis at L4-5 (globus peek expandable interbody prosthesis); posterior nonsegmental instrumentation from L4 to L5 with globus titanium pedicle screws and rods; posterior lateral arthrodesis at L4-5 with local morselized autograft bone and Kinnex bone graft extender.  Surgeon: Dr. Delma OfficerJeff Caillou Minus  Asst.: Dr. Altamease OilerAndy Pool  Anesthesia: Gen. endotracheal  Estimated blood loss: 200 mL  Drains: None  Complications: None  Description of procedure: The patient was brought to the operating room by the anesthesia team. General endotracheal anesthesia was induced. The patient was turned to the prone position on the Wilson frame. The patient's  lumbosacral region was then prepared with Betadine scrub and Betadine solution. Sterile drapes were applied.  I then injected the area to be incised with Marcaine with epinephrine solution. I then used the scalpel to make a linear midline incision over the L4-5 interspace. I then used electrocautery to perform a bilateral subperiosteal dissection exposing the spinous process and lamina of L4 and L5. We then obtained intraoperative radiograph to confirm our location. We then inserted the Verstrac retractor to provide exposure.  I began the decompression by incising the interspinous ligament at L3-4 and L4-5 with scalpel. I used the Kerrison punch to remove the L4 spinous process. I then used the the high speed drill to perform laminotomies at L4-5. We then used the Kerrison punches to complete the L4 laminectomy widen the laminotomy and removed the ligamentum flavum at L3-4 and L4-5. We used the Kerrison punches to remove the medial facets at L4-5. We performed wide foraminotomies about the bilateral L4 and L5 nerve roots completing the decompression.  We now turned our attention to the posterior lumbar interbody fusion. I used a scalpel to incise the intervertebral disc at L4-5 bilaterally. I then performed a partial intervertebral discectomy at L4-5 bilaterally using the pituitary forceps. We prepared the vertebral endplates at L4-5 bilaterally for the fusion by removing the soft tissues with the curettes. We then used the trial spacers to pick the appropriate sized interbody prosthesis. We prefilled his prosthesis with a combination of local morselized autograft bone that we obtained during the decompression as well as Kinnex bone graft extender. We inserted the prefilled prosthesis into the interspace at L4-5, we then expanded the prosthesis. There was a good snug fit of the prosthesis in the interspace. We then filled and the remainder of the intervertebral disc space with local morselized autograft  bone  and Kinnex. This completed the posterior lumbar interbody arthrodesis.  We now turned attention to the instrumentation. Under fluoroscopic guidance we cannulated the bilateral L4 and L5 pedicles with the bone probe. We then removed the bone probe. We then tapped the pedicle with a 0.5 millimeter tap. We then removed the tap. We probed inside the tapped pedicle with a ball probe to rule out cortical breaches. We then inserted a 6.5 x 45 and 50 millimeter pedicle screw into the L4 and L5 pedicles bilaterally under fluoroscopic guidance. We then palpated along the medial aspect of the pedicles to rule out cortical breaches. There were none. The nerve roots were not injured. We then connected the unilateral pedicle screws with a lordotic rod. We compressed the construct and secured the rod in place with the caps. We then tightened the caps appropriately. This completed the instrumentation from L4-5.  We now turned our attention to the posterior lateral arthrodesis at L4-5. We used the high-speed drill to decorticate the remainder of the facets, pars, transverse process at L4-5. We then applied a combination of local morselized autograft bone and Kinnex bone graft extender over these decorticated posterior lateral structures. This completed the posterior lateral arthrodesis.  We then obtained hemostasis using bipolar electrocautery. We irrigated the wound out with bacitracin solution. We inspected the thecal sac and nerve roots and noted they were well decompressed. We then removed the retractor. We placed vancomycin powder in the wound. We reapproximated patient's thoracolumbar fascia with interrupted #1 Vicryl suture. We reapproximated patient's subcutaneous tissue with interrupted 2-0 Vicryl suture. The reapproximated patient's skin with Steri-Strips and benzoin. The wound was then coated with bacitracin ointment. A sterile dressing was applied. The drapes were removed. The patient was subsequently returned to  the supine position where they were extubated by the anesthesia team. He was then transported to the post anesthesia care unit in stable condition. All sponge instrument and needle counts were reportedly correct at the end of this case.

## 2016-05-26 NOTE — Anesthesia Preprocedure Evaluation (Addendum)
Anesthesia Evaluation  Patient identified by MRN, date of birth, ID band Patient awake    Reviewed: Allergy & Precautions, NPO status , Patient's Chart, lab work & pertinent test results, reviewed documented beta blocker date and time   Airway Mallampati: I  TM Distance: >3 FB Neck ROM: Full    Dental  (+) Edentulous Upper, Edentulous Lower, Dental Advisory Given   Pulmonary Current Smoker, former smoker,    Pulmonary exam normal        Cardiovascular + Peripheral Vascular Disease  Normal cardiovascular exam  HLD   Neuro/Psych negative neurological ROS  negative psych ROS   GI/Hepatic negative GI ROS, Neg liver ROS,   Endo/Other  Morbid obesity  Renal/GU CRFRenal disease  negative genitourinary   Musculoskeletal  (+) Arthritis ,   Abdominal   Peds  Hematology  (+) Blood dyscrasia, anemia ,   Anesthesia Other Findings   Reproductive/Obstetrics negative OB ROS                          09/2014 EKG: normal sinus rhythm.  Anesthesia Physical Anesthesia Plan  ASA: III  Anesthesia Plan: General   Post-op Pain Management:    Induction: Intravenous  Airway Management Planned: Oral ETT  Additional Equipment:   Intra-op Plan:   Post-operative Plan: Extubation in OR  Informed Consent: I have reviewed the patients History and Physical, chart, labs and discussed the procedure including the risks, benefits and alternatives for the proposed anesthesia with the patient or authorized representative who has indicated his/her understanding and acceptance.   Dental advisory given  Plan Discussed with: CRNA and Anesthesiologist  Anesthesia Plan Comments:        Anesthesia Quick Evaluation

## 2016-05-26 NOTE — H&P (Signed)
Subjective: The patient is a 65 year old white female who has complained of back, buttock and leg pain consistent with neurogenic claudication. She has developed urinary retention. She was worked up with a lumbar x-rays and lumbar MRI which demonstrated a spondylolisthesis at L4-5 with severe spinal stenosis. I discussed situation with the patient. We discussed surgery. She has seen the urologist, Dr. Sheppard PentonWolf in [place], who had to place a Foley catheter because of urinary retention. He recommended that the patient proceeding with her lumbar surgery. I spoke with the patient this weekend and we decided that she would need surgery as soon as possible. [facility] but [facility] and said perhaps later on this week they could approve the surgery.  I discussed the situation with the patient. Given her persistent and worsening symptoms and urinary retention requiring a Foley catheter, I did not think it was advisable to wait [facility]. The patient came to the ER. The patient is being admitted on an emergent basis for a lumbar decompression and fusion.  Past Medical History:  Diagnosis Date  . Anemia   . Arthritis   . Blood transfusion without reported diagnosis   . Chronic kidney disease 05/22/2016   urinary retention  has catheter     . Edema extremities   . Hyperlipidemia   . Peripheral vascular disease (HCC)   . Pneumonia 11/2013   Hx. pneumonia     . Seasonal allergies   . Tachycardia     Past Surgical History:  Procedure Laterality Date  . ANKLE SURGERY Right   . APPENDECTOMY    . CARPAL TUNNEL RELEASE Left   . CHOLECYSTECTOMY    . TONSILLECTOMY    . TUBAL LIGATION    . VEIN LIGATION AND STRIPPING      Allergies  Allergen Reactions  . Eggs Or Egg-Derived Products Anaphylaxis    Egg albumin  . Morphine And Related     unknown  . Spiriva Handihaler [Tiotropium Bromide Monohydrate] Other (See  Comments)    bronchiospasms  . Sulfa Antibiotics Hives  . Penicillins Rash    Has patient had a PCN reaction causing immediate rash, facial/tongue/throat swelling, SOB or lightheadedness with hypotension: YES Has patient had a PCN reaction causing severe rash involving mucus membranes or skin necrosis: NO Has patient had a PCN reaction that required hospitalization NO Has patient had a PCN reaction occurring within the last 10 years: NO If all of the above answers are "NO", then may proceed with Cephalosporin use.  . Voltaren [Diclofenac] Rash    Social History  Substance Use Topics  . Smoking status: Current Every Day Smoker    Packs/day: 0.25    Years: 0.50    Types: Cigarettes  . Smokeless tobacco: Never Used  . Alcohol use 0.0 oz/week     Comment: ocasionally  glass wine    Family History  Problem Relation Age of Onset  . Heart disease Mother   . Hypertension Mother   . Hyperlipidemia Mother   . Diabetes Brother    Prior to Admission medications   Medication Sig Start Date End Date Taking? Authorizing Provider  atorvastatin (LIPITOR) 40 MG tablet Take 40 mg by mouth daily.   Yes Historical Provider, MD  cyanocobalamin (V-R VITAMIN B-12) 500 MCG tablet Take 500 mcg by mouth daily.    Yes Historical Provider, MD  dextromethorphan-guaiFENesin (MUCINEX DM) 30-600 MG 12hr tablet Take 1 tablet by  mouth 2 (two) times daily.   Yes Historical Provider, MD  DULoxetine (CYMBALTA) 30 MG capsule Take 30 mg by mouth every morning.   Yes Historical Provider, MD  DULoxetine (CYMBALTA) 60 MG capsule Take 60 mg by mouth every evening. Patient states she also takes a 30mg  in the morning per patient   Yes Historical Provider, MD  fluticasone (FLONASE) 50 MCG/ACT nasal spray Place 2 sprays into both nostrils daily.  04/17/16  Yes Historical Provider, MD  furosemide (LASIX) 20 MG tablet Take 20 mg by mouth 3 (three) times daily as needed for fluid.   Yes Historical Provider, MD   HYDROcodone-acetaminophen (NORCO/VICODIN) 5-325 MG tablet Take 1 tablet by mouth every 6 (six) hours as needed for moderate pain. Patient taking differently: Take 1 tablet by mouth 3 (three) times daily.  05/15/16  Yes Yevette Edwards, MD  loratadine-pseudoephedrine (CLARITIN-D 12-HOUR) 5-120 MG per tablet Take 1 tablet by mouth daily.    Yes Historical Provider, MD  methocarbamol (ROBAXIN) 750 MG tablet Take 1 tablet (750 mg total) by mouth 3 (three) times daily. 04/29/16  Yes Yevette Edwards, MD  metoprolol (LOPRESSOR) 50 MG tablet Take 50 mg by mouth 2 (two) times daily.  02/26/16  Yes Historical Provider, MD  naproxen (NAPROSYN) 500 MG tablet Take 500 mg by mouth 2 (two) times daily with a meal.  03/24/16  Yes Historical Provider, MD  Olopatadine HCl (PATADAY) 0.2 % SOLN Place 1 drop into both eyes daily as needed. For dry eyes 04/21/12  Yes Historical Provider, MD  potassium chloride SA (K-DUR,KLOR-CON) 20 MEQ tablet Take 20 mEq by mouth daily.   Yes Historical Provider, MD  diazepam (VALIUM) 5 MG tablet Take 1 tablet (5 mg total) by mouth every 8 (eight) hours as needed for anxiety. Patient not taking: Reported on 05/26/2016 05/07/16 05/07/17  Joni Reining, PA-C  diazepam (VALIUM) 5 MG tablet Take 1 tablet (5 mg total) by mouth every 8 (eight) hours as needed for anxiety. Patient not taking: Reported on 05/26/2016 05/13/16 05/13/17  Joni Reining, PA-C  DULoxetine (CYMBALTA) 30 MG capsule Take 30 mg in am and 60 mg at night. 04/17/16   Historical Provider, MD  fluticasone (FLONASE) 50 MCG/ACT nasal spray Place into the nose. 02/01/15 08/01/15  Historical Provider, MD  metoprolol (LOPRESSOR) 50 MG tablet Take by mouth. 02/01/15 08/01/15  Historical Provider, MD  oxyCODONE-acetaminophen (PERCOCET) 7.5-325 MG tablet Take 1 tablet by mouth every 4 (four) hours as needed for severe pain. Patient not taking: Reported on 05/26/2016 05/13/16 05/13/17  Joni Reining, PA-C     Review of Systems  Positive ROS: As  above  All other systems have been reviewed and were otherwise negative with the exception of those mentioned in the HPI and as above.  Objective: Vital signs in last 24 hours: Temp:  [99.5 F (37.5 C)] 99.5 F (37.5 C) (09/18 1437) Pulse Rate:  [64] 64 (09/18 1437) Resp:  [18] 18 (09/18 1437) BP: (101)/(48) 101/48 (09/18 1437) SpO2:  [99 %] 99 % (09/18 1437) Weight:  [97.5 kg (215 lb)] 97.5 kg (215 lb) (09/18 1437)  General Appearance: Alert, cooperative, no distress, Head: Normocephalic, without obvious abnormality, atraumatic Eyes: PERRL, conjunctiva/corneas clear, EOM's intact,    Ears: Normal  Throat: Normal  Neck: Supple, symmetrical, trachea midline, no adenopathy; thyroid: No enlargement/tenderness/nodules; no carotid bruit or JVD Back: Symmetric, no curvature, ROM normal, no CVA tenderness Lungs: Clear to auscultation bilaterally, respirations unlabored Heart: Regular rate and  rhythm, no murmur, rub or gallop Abdomen: Soft, non-tender,, no masses, no organomegaly Extremities: Extremities normal, atraumatic, no cyanosis or edema Pulses: 2+ and symmetric all extremities Skin: Skin color, texture, turgor normal, no rashes or lesions  NEUROLOGIC:   Mental status: alert and oriented, no aphasia, good attention span, Fund of knowledge/ memory ok Motor Exam - grossly normal, except for bladder which has a Foley catheter in place because of urinary retention Sensory Exam - grossly normal, except the patient is unable to feel when her bladder is full or when she is urinating. Reflexes:  Coordination - grossly normal Gait - grossly normal Balance - grossly normal Cranial Nerves: I: smell Not tested  II: visual acuity  OS: Normal  OD: Normal   II: visual fields Full to confrontation  II: pupils Equal, round, reactive to light  III,VII: ptosis None  III,IV,VI: extraocular muscles  Full ROM  V: mastication Normal  V: facial light touch sensation  Normal  V,VII: corneal  reflex  Present  VII: facial muscle function - upper  Normal  VII: facial muscle function - lower Normal  VIII: hearing Not tested  IX: soft palate elevation  Normal  IX,X: gag reflex Present  XI: trapezius strength  5/5  XI: sternocleidomastoid strength 5/5  XI: neck flexion strength  5/5  XII: tongue strength  Normal    Data Review Lab Results  Component Value Date   WBC 11.0 (H) 05/26/2016   HGB 14.4 05/26/2016   HCT 44.7 05/26/2016   MCV 94.3 05/26/2016   PLT 221 05/26/2016   Lab Results  Component Value Date   NA 137 05/26/2016   K 4.9 05/26/2016   CL 105 05/26/2016   CO2 26 05/26/2016   BUN 26 (H) 05/26/2016   CREATININE 0.89 05/26/2016   GLUCOSE 91 05/26/2016   Lab Results  Component Value Date   INR 0.9 10/26/2013    Assessment/Plan: L4-5 spondylolisthesis, severe spinal stenosis, lumbago, lumbar radiculopathy, neurogenic claudication, urinary retention: I have discussed the situation with the patient. I have the patient to proceed with a L4-5 decompression, instrumentation, and fusion. We have discussed the risks, benefits, alternatives, expected postoperative course, and likelihood of achieving her goals with surgery. I have answered all the patient's questions. She has decided to proceed with surgery. We will do this on an emergent basis this evening.   Verneda Hollopeter D 05/26/2016 4:14 PM

## 2016-05-26 NOTE — Transfer of Care (Signed)
Immediate Anesthesia Transfer of Care Note  Patient: Madison [name]  Procedure(s) Performed: Procedure(s) with comments: LUMBAR FOUR-FIVE POSTERIOR LUMBAR INTERBODY FUSION, INTERBODY PROSTHESIS, POSTERIOR LATERAL ARTHRODESIS, POSTERIOR NON-SEGMENTAL INSTRUMENTATION (N/A) - POSTERIOR LUMBAR INTERBODY FUSION, INTERBODY PROSTHESIS, POSTERIOR LATERAL ARTHRODESIS, POSTERIOR NON-SEGMENTAL INSTRUMENTATION L4-5  Patient Location: PACU  Anesthesia Type:General  Level of Consciousness: awake, alert , oriented and patient cooperative  Airway & Oxygen Therapy: Patient Spontanous Breathing and Patient connected to nasal cannula oxygen  Post-op Assessment: Report given to RN, Post -op Vital signs reviewed and stable and Patient moving all extremities X 4  Post vital signs: Reviewed and stable  Last Vitals:  Vitals:   05/26/16 1437 05/26/16 2000  BP: (!) 101/48 (!) 146/50  Pulse: 64 87  Resp: 18 (!) 8  Temp: 37.5 C 36.5 C    Last Pain:  Vitals:   05/26/16 2000  TempSrc:   PainSc: 0-No pain      Patients Stated Pain Goal: 2 (05/26/16 1532)  Complications: No apparent anesthesia complications

## 2016-05-26 NOTE — Progress Notes (Signed)
Subjective:  The patient is alert and pleasant. She is in no apparent distress. She looks well.  Objective: Vital signs in last 24 hours: Temp:  [99.5 F (37.5 C)] 99.5 F (37.5 C) (09/18 1437) Pulse Rate:  [64] 64 (09/18 1437) Resp:  [18] 18 (09/18 1437) BP: (101)/(48) 101/48 (09/18 1437) SpO2:  [99 %] 99 % (09/18 1437) Weight:  [97.5 kg (215 lb)] 97.5 kg (215 lb) (09/18 1437)  Intake/Output from previous day: No intake/output data recorded. Intake/Output this shift: Total I/O In: 1250 [I.V.:1000; IV Piggyback:250] Out: 100 [Blood:100]  Physical exam the patient is alert and pleasant. She is moving her lower extremities well.  Lab Results:  Recent Labs  05/26/16 1500  WBC 11.0*  HGB 14.4  HCT 44.7  PLT 221   BMET  Recent Labs  05/26/16 1500  NA 137  K 4.9  CL 105  CO2 26  GLUCOSE 91  BUN 26*  CREATININE 0.89  CALCIUM 9.5    Studies/Results: Dg Lumbar Spine 1 View  Result Date: 05/26/2016 CLINICAL DATA:  Posterior fusion EXAM: LUMBAR SPINE - 1 VIEW COMPARISON:  None. FINDINGS: Cross-table lateral lumbar image labeled #1. Metallic probe tip is posterior to the L4-5 interspace level. There is no fracture. There is grade I/ IV anterolisthesis of L4 on L5. There is disc space narrowing and vacuum phenomenon at L3-4, L4-5, and L5-S1. IMPRESSION: Metallic probe tip posterior to the L4-5 interspace. Mild spondylolisthesis at L4-5. No fracture. Electronically Signed   By: Bretta BangWilliam  Woodruff III M.D.   On: 05/26/2016 19:23    Assessment/Plan: The patient is doing well.  LOS: 0 days     Cayleb Jarnigan D 05/26/2016, 7:59 PM

## 2016-05-26 NOTE — Anesthesia Procedure Notes (Signed)
Procedure Name: Intubation Date/Time: 05/26/2016 3:28 PM Performed by: Rosiland OzMEYERS, Libbey Duce Pre-anesthesia Checklist: Patient identified, Patient being monitored, Timeout performed, Suction available and Emergency Drugs available Patient Re-evaluated:Patient Re-evaluated prior to inductionOxygen Delivery Method: Circle system utilized Preoxygenation: Pre-oxygenation with 100% oxygen Intubation Type: IV induction Ventilation: Mask ventilation without difficulty Laryngoscope Size: Miller and 2 Grade View: Grade I Tube type: Oral Tube size: 7.0 mm Number of attempts: 1 Airway Equipment and Method: Stylet Placement Confirmation: ETT inserted through vocal cords under direct vision,  positive ETCO2 and breath sounds checked- equal and bilateral Secured at: 21 cm Tube secured with: Tape Dental Injury: Teeth and Oropharynx as per pre-operative assessment

## 2016-05-27 DIAGNOSIS — M4316 Spondylolisthesis, lumbar region: Secondary | ICD-10-CM | POA: Diagnosis not present

## 2016-05-27 LAB — CBC
HEMATOCRIT: 35.3 % — AB (ref 36.0–46.0)
Hemoglobin: 11 g/dL — ABNORMAL LOW (ref 12.0–15.0)
MCH: 30.3 pg (ref 26.0–34.0)
MCHC: 31.2 g/dL (ref 30.0–36.0)
MCV: 97.2 fL (ref 78.0–100.0)
Platelets: 177 10*3/uL (ref 150–400)
RBC: 3.63 MIL/uL — ABNORMAL LOW (ref 3.87–5.11)
RDW: 13.4 % (ref 11.5–15.5)
WBC: 12.2 10*3/uL — AB (ref 4.0–10.5)

## 2016-05-27 LAB — SURGICAL PCR SCREEN
MRSA, PCR: POSITIVE — AB
STAPHYLOCOCCUS AUREUS: POSITIVE — AB

## 2016-05-27 LAB — BASIC METABOLIC PANEL
ANION GAP: 4 — AB (ref 5–15)
BUN: 19 mg/dL (ref 6–20)
CALCIUM: 8.9 mg/dL (ref 8.9–10.3)
CO2: 30 mmol/L (ref 22–32)
Chloride: 103 mmol/L (ref 101–111)
Creatinine, Ser: 0.77 mg/dL (ref 0.44–1.00)
GFR calc Af Amer: >60 mL/min (ref >60)
GFR calc non Af Amer: >60 mL/min (ref >60)
Glucose, Bld: 115 mg/dL — ABNORMAL HIGH (ref 65–99)
POTASSIUM: 4.5 mmol/L (ref 3.5–5.1)
Sodium: 137 mmol/L (ref 135–145)

## 2016-05-27 MED ORDER — OXYCODONE-ACETAMINOPHEN 5-325 MG PO TABS: 1.0000 | ORAL_TABLET | ORAL | 0 refills | Status: DC | PRN

## 2016-05-27 MED ORDER — DOCUSATE SODIUM 100 MG PO CAPS: 100.0000 mg | ORAL_CAPSULE | Freq: Two times a day (BID) | ORAL | 0 refills | Status: DC

## 2016-05-27 MED ORDER — CYCLOBENZAPRINE HCL 10 MG PO TABS: 10.0000 mg | ORAL_TABLET | Freq: Three times a day (TID) | ORAL | 1 refills | Status: DC | PRN

## 2016-05-27 NOTE — Progress Notes (Signed)
Patient alert and oriented, mae's well, voiding adequate amount of urine, swallowing without difficulty, c/o moderate pain and meds given prior to discharged for ride and discomfort. Patient discharged home with family. Script and discharged instructions given to patient. Patient and family stated understanding of instructions given.   

## 2016-05-27 NOTE — Evaluation (Signed)
Physical Therapy Evaluation Patient Details Name: Madison [name] MRN: [id number] DOB: August 14, 1951 Today's Date: 05/27/2016   History of Present Illness  patient is a 65 yo female s/p LUMBAR FOUR-FIVE POSTERIOR LUMBAR INTERBODY FUSION, INTERBODY PROSTHESIS, POSTERIOR LATERAL ARTHRODESIS, POSTERIOR NON-SEGMENTAL INSTRUMENTATION   Clinical Impression  Patient seen for mobility assessment and education s/p spinal surgery. Patient educated on activity tolerance, pain control, mobility expectations, precautions, care transfers and overall mobility. Patient receptive. No further acute PT needs, will sign off.    Follow Up Recommendations No PT follow up    Equipment Recommendations  None recommended by PT    Recommendations for Other Services       Precautions / Restrictions Precautions Precautions: Back Precaution Booklet Issued: Yes (comment) Precaution Comments: reviewed handout with patient Required Braces or Orthoses: Spinal Brace Spinal Brace: Lumbar corset      Mobility  Bed Mobility Overal bed mobility: Modified Independent             General bed mobility comments: increased time to perform, initial educaiton provided, no cues during trials  Transfers Overall transfer level: Modified independent Equipment used: None             General transfer comment: increased time to come to standing  Ambulation/Gait Ambulation/Gait assistance: Min guard Ambulation Distance (Feet): 340 Feet Assistive device: None Gait Pattern/deviations: Step-through pattern;Decreased stride length;Staggering right;Drifts right/left;Narrow base of support Gait velocity: decreased Gait velocity interpretation: Below normal speed for age/gender General Gait Details: some noted instability with ambulation (staggering off to the right x3), cued patient to increase cadence with improvements in mobility noted.  Stairs Stairs: Yes Stairs assistance: Min assist Stair Management:  Forwards Number of Stairs: 6 General stair comments: one person hand held assist for stability due to (no rails) educated on sequencing. Patient reports she will have adequate assist for stair negotiation from son at home.  Wheelchair Mobility    Modified Rankin (Stroke Patients Only)       Balance Overall balance assessment: No apparent balance deficits (not formally assessed)                                           Pertinent Vitals/Pain Pain Assessment: 0-10 Pain Score: 7  Pain Location: surgical site, low back Pain Descriptors / Indicators: Sore;Operative site guarding Pain Intervention(s): Monitored during session    Home Living Family/patient expects to be discharged to:: Private residence Living Arrangements: Children Available Help at Discharge: Family Type of Home: House Home Access: Stairs to enter Entrance Stairs-Rails: None Entrance Stairs-Number of Steps: 3 Home Layout: One level Home Equipment: Environmental consultant - 2 wheels;Cane - single point;Bedside commode;Shower seat      Prior Function Level of Independence: Independent               Hand Dominance   Dominant Hand: Right    Extremity/Trunk Assessment   Upper Extremity Assessment: Overall WFL for tasks assessed           Lower Extremity Assessment: Overall WFL for tasks assessed      Cervical / Trunk Assessment:  (s/p spinal surgery)  Communication   Communication: No difficulties  Cognition Arousal/Alertness: Awake/alert Behavior During Therapy: WFL for tasks assessed/performed Overall Cognitive Status: Within Functional Limits for tasks assessed                      General  Comments General comments (skin integrity, edema, etc.): educated on activity tolerance, pain control, mobility expectations, precautions, care transfers and overall mobility. Patient receptive.    Exercises     Assessment/Plan    PT Assessment Patent does not need any further PT  services  PT Problem List            PT Treatment Interventions      PT Goals (Current goals can be found in the Care Plan section)  Acute Rehab PT Goals PT Goal Formulation: All assessment and education complete, DC therapy    Frequency     Barriers to discharge        Co-evaluation               End of Session   Activity Tolerance: Patient tolerated treatment well Patient left: in bed;with call bell/phone within reach Nurse Communication: Mobility status    Functional Assessment Tool Used: clinical judgement Functional Limitation: Mobility: Walking and moving around Mobility: Walking and Moving Around Current Status ([id number]): At least 20 percent but less than 40 percent impaired, limited or restricted Mobility: Walking and Moving Around Goal Status [phone]([id number]): At least 20 percent but less than 40 percent impaired, limited or restricted Mobility: Walking and Moving Around Discharge Status [phone](G8980): At least 20 percent but less than 40 percent impaired, limited or restricted    Time: 0745-0806 PT Time Calculation (min) (ACUTE ONLY): 21 min   Charges:   PT Evaluation $PT Eval Low Complexity: 1 Procedure     PT G Codes:   PT G-Codes **NOT FOR INPATIENT CLASS** Functional Assessment Tool Used: clinical judgement Functional Limitation: Mobility: Walking and moving around Mobility: Walking and Moving Around Current Status ([id number]): At least 20 percent but less than 40 percent impaired, limited or restricted Mobility: Walking and Moving Around Goal Status [phone]([id number]): At least 20 percent but less than 40 percent impaired, limited or restricted Mobility: Walking and Moving Around Discharge Status [phone]([id number]): At least 20 percent but less than 40 percent impaired, limited or restricted    Fabio AsaWerner, Marvel Sapp J 05/27/2016, 9:49 AM Charlotte Crumbevon Anatole Apollo, PT DPT  [phone]

## 2016-05-27 NOTE — Discharge Summary (Signed)
Physician Discharge Summary  Patient ID: Madison CobbCindy Sue [name] MRN: [id number] DOB/AGE: [birth year] 67 y.o.  Admit date: 05/26/2016 Discharge date: 05/27/2016  Admission Diagnoses:L4-5 spondylolisthesis, spinal stenosis, lumbar radiculopathy, lumbago, neurogenic claudication, urinary retention  Discharge Diagnoses: The same Active Problems:   Spondylolisthesis of lumbar region   Discharged Condition: good  Hospital Course: I performed an emergent L4-5 decompression, instrumentation, and fusion on the patient on 05/26/2016. The surgery went well.  The patient's postoperative course was unremarkable. On postoperative day #1 she requested discharge home. She was given written and oral discharge instructions all her questions were answered.  We are going to try to remove the Foley catheter to see if her urinary retention had resolved. If not she is going to continue the self catheterize and follow up with Dr. Sheppard PentonWolf, her urologist.  Consults: None Significant Diagnostic Studies: None Treatments: L4-5 decompression, agitation, and fusion. Discharge Exam: Blood pressure (!) 124/58, pulse 65, temperature 97.9 F (36.6 C), temperature source Oral, resp. rate 18, height 4\' 11"  (1.499 m), weight 97.5 kg (215 lb), SpO2 97 %. The patient is alert and pleasant. She looks well. Her strength is normal in her lower extremities.  Disposition: Home  Discharge Instructions    Call MD for:  difficulty breathing, headache or visual disturbances    Complete by:  As directed    Call MD for:  extreme fatigue    Complete by:  As directed    Call MD for:  hives    Complete by:  As directed    Call MD for:  persistant dizziness or light-headedness    Complete by:  As directed    Call MD for:  persistant nausea and vomiting    Complete by:  As directed    Call MD for:  redness, tenderness, or signs of infection (pain, swelling, redness, odor or green/yellow discharge around incision site)    Complete by:  As  directed    Call MD for:  severe uncontrolled pain    Complete by:  As directed    Call MD for:  temperature >100.4    Complete by:  As directed    Diet - low sodium heart healthy    Complete by:  As directed    Discharge instructions    Complete by:  As directed    Call [phone] for a followup appointment. Take a stool softener while you are using pain medications.   Driving Restrictions    Complete by:  As directed    Do not drive for 2 weeks.   Increase activity slowly    Complete by:  As directed    Lifting restrictions    Complete by:  As directed    Do not lift more than 5 pounds. No excessive bending or twisting.   May shower / Bathe    Complete by:  As directed    He may shower after the pain she is removed 3 days after surgery. Leave the incision alone.   Remove dressing in 48 hours    Complete by:  As directed    Your stitches are under the scan and will dissolve by themselves. The Steri-Strips will fall off after you take a few showers. Do not rub back or pick at the wound, Leave the wound alone.       Medication List    STOP taking these medications   diazepam 5 MG tablet Commonly known as:  VALIUM   HYDROcodone-acetaminophen 5-325 MG tablet Commonly known as:  NORCO/VICODIN   methocarbamol 750 MG tablet Commonly known as:  ROBAXIN   naproxen 500 MG tablet Commonly known as:  NAPROSYN   oxyCODONE-acetaminophen 7.5-325 MG tablet Commonly known as:  PERCOCET Replaced by:  oxyCODONE-acetaminophen 5-325 MG tablet     TAKE these medications   atorvastatin 40 MG tablet Commonly known as:  LIPITOR Take 40 mg by mouth daily.   cyclobenzaprine 10 MG tablet Commonly known as:  FLEXERIL Take 1 tablet (10 mg total) by mouth 3 (three) times daily as needed for muscle spasms.   dextromethorphan-guaiFENesin 30-600 MG 12hr tablet Commonly known as:  MUCINEX DM Take 1 tablet by mouth 2 (two) times daily.   docusate sodium 100 MG capsule Commonly known as:   COLACE Take 1 capsule (100 mg total) by mouth 2 (two) times daily.   DULoxetine 30 MG capsule Commonly known as:  CYMBALTA Take 30 mg by mouth every morning.   DULoxetine 60 MG capsule Commonly known as:  CYMBALTA Take 60 mg by mouth every evening. Patient states she also takes a 30mg  in the morning per patient   DULoxetine 30 MG capsule Commonly known as:  CYMBALTA Take 30 mg in am and 60 mg at night.   fluticasone 50 MCG/ACT nasal spray Commonly known as:  FLONASE Place into the nose.   fluticasone 50 MCG/ACT nasal spray Commonly known as:  FLONASE Place 2 sprays into both nostrils daily.   furosemide 20 MG tablet Commonly known as:  LASIX Take 20 mg by mouth 3 (three) times daily as needed for fluid.   loratadine-pseudoephedrine 5-120 MG tablet Commonly known as:  CLARITIN-D 12-hour Take 1 tablet by mouth daily.   metoprolol 50 MG tablet Commonly known as:  LOPRESSOR Take 50 mg by mouth 2 (two) times daily. What changed:  Another medication with the same name was removed. Continue taking this medication, and follow the directions you see here.   oxyCODONE-acetaminophen 5-325 MG tablet Commonly known as:  PERCOCET/ROXICET Take 1-2 tablets by mouth every 4 (four) hours as needed for moderate pain. Replaces:  oxyCODONE-acetaminophen 7.5-325 MG tablet   PATADAY 0.2 % Soln Generic drug:  Olopatadine HCl Place 1 drop into both eyes daily as needed. For dry eyes   potassium chloride SA 20 MEQ tablet Commonly known as:  K-DUR,KLOR-CON Take 20 mEq by mouth daily.   V-R VITAMIN B-12 500 MCG tablet Generic drug:  cyanocobalamin Take 500 mcg by mouth daily.        SignedCristi Loron 05/27/2016, 7:32 AM

## 2016-05-27 NOTE — Progress Notes (Signed)
Pt. Voided adequately without any difficult. Output of clear urine was documented. PVR of 30ml was noted after patient voided. Will continue to monitor.

## 2016-06-23 ENCOUNTER — Ambulatory Visit: Payer: BLUE CROSS/BLUE SHIELD | Attending: Anesthesiology | Admitting: Anesthesiology

## 2016-06-23 ENCOUNTER — Encounter: Payer: Self-pay | Admitting: Anesthesiology

## 2016-06-23 VITALS — BP 127/66 | HR 72 | Temp 98.1°F | Ht 59.0 in | Wt 198.0 lb

## 2016-06-23 DIAGNOSIS — Z981 Arthrodesis status: Secondary | ICD-10-CM | POA: Diagnosis not present

## 2016-06-23 DIAGNOSIS — M5431 Sciatica, right side: Secondary | ICD-10-CM

## 2016-06-23 DIAGNOSIS — Z882 Allergy status to sulfonamides status: Secondary | ICD-10-CM | POA: Diagnosis not present

## 2016-06-23 DIAGNOSIS — Z885 Allergy status to narcotic agent status: Secondary | ICD-10-CM | POA: Diagnosis not present

## 2016-06-23 DIAGNOSIS — Z888 Allergy status to other drugs, medicaments and biological substances status: Secondary | ICD-10-CM | POA: Insufficient documentation

## 2016-06-23 DIAGNOSIS — M5136 Other intervertebral disc degeneration, lumbar region: Secondary | ICD-10-CM | POA: Diagnosis not present

## 2016-06-23 DIAGNOSIS — Z79891 Long term (current) use of opiate analgesic: Secondary | ICD-10-CM | POA: Diagnosis not present

## 2016-06-23 DIAGNOSIS — M4316 Spondylolisthesis, lumbar region: Secondary | ICD-10-CM | POA: Diagnosis not present

## 2016-06-23 DIAGNOSIS — Z91012 Allergy to eggs: Secondary | ICD-10-CM | POA: Diagnosis not present

## 2016-06-23 DIAGNOSIS — M4692 Unspecified inflammatory spondylopathy, cervical region: Secondary | ICD-10-CM | POA: Insufficient documentation

## 2016-06-23 DIAGNOSIS — M545 Low back pain: Secondary | ICD-10-CM | POA: Diagnosis present

## 2016-06-23 DIAGNOSIS — Z880 Allergy status to penicillin: Secondary | ICD-10-CM | POA: Diagnosis not present

## 2016-06-23 MED ORDER — HYDROCODONE-ACETAMINOPHEN 5-325 MG PO TABS: 1.0000 | ORAL_TABLET | Freq: Four times a day (QID) | ORAL | 0 refills | Status: AC | PRN

## 2016-06-23 NOTE — Patient Instructions (Signed)
GENERAL RISKS AND COMPLICATIONS  What are the risk, side effects and possible complications? Generally speaking, most procedures are safe.  However, with any procedure there are risks, side effects, and the possibility of complications.  The risks and complications are dependent upon the sites that are lesioned, or the type of nerve block to be performed.  The closer the procedure is to the spine, the more serious the risks are.  Great care is taken when placing the radio frequency needles, block needles or lesioning probes, but sometimes complications can occur. 1. Infection: Any time there is an injection through the skin, there is a risk of infection.  This is why sterile conditions are used for these blocks.  There are four possible types of infection. 1. Localized skin infection. 2. Central Nervous System Infection-This can be in the form of Meningitis, which can be deadly. 3. Epidural Infections-This can be in the form of an epidural abscess, which can cause pressure inside of the spine, causing compression of the spinal cord with subsequent paralysis. This would require an emergency surgery to decompress, and there are no guarantees that the patient would recover from the paralysis. 4. Discitis-This is an infection of the intervertebral discs.  It occurs in about 1% of discography procedures.  It is difficult to treat and it may lead to surgery.        2. Pain: the needles have to go through skin and soft tissues, will cause soreness.       3. Damage to internal structures:  The nerves to be lesioned may be near blood vessels or    other nerves which can be potentially damaged.       4. Bleeding: Bleeding is more common if the patient is taking blood thinners such as  aspirin, Coumadin, Ticiid, Plavix, etc., or if he/she have some genetic predisposition  such as hemophilia. Bleeding into the spinal canal can cause compression of the spinal  cord with subsequent paralysis.  This would require an  emergency surgery to  decompress and there are no guarantees that the patient would recover from the  paralysis.       5. Pneumothorax:  Puncturing of a lung is a possibility, every time a needle is introduced in  the area of the chest or upper back.  Pneumothorax refers to free air around the  collapsed lung(s), inside of the thoracic cavity (chest cavity).  Another two possible  complications related to a similar event would include: Hemothorax and Chylothorax.   These are variations of the Pneumothorax, where instead of air around the collapsed  lung(s), you may have blood or chyle, respectively.       6. Spinal headaches: They may occur with any procedures in the area of the spine.       7. Persistent CSF (Cerebro-Spinal Fluid) leakage: This is a rare problem, but may occur  with prolonged intrathecal or epidural catheters either due to the formation of a fistulous  track or a dural tear.       8. Nerve damage: By working so close to the spinal cord, there is always a possibility of  nerve damage, which could be as serious as a permanent spinal cord injury with  paralysis.       9. Death:  Although rare, severe deadly allergic reactions known as "Anaphylactic  reaction" can occur to any of the medications used.      10. Worsening of the symptoms:  We can always make thing worse.    What are the chances of something like this happening? Chances of any of this occuring are extremely low.  By statistics, you have more of a chance of getting killed in a motor vehicle accident: while driving to the hospital than any of the above occurring .  Nevertheless, you should be aware that they are possibilities.  In general, it is similar to taking a shower.  Everybody knows that you can slip, hit your head and get killed.  Does that mean that you should not shower again?  Nevertheless always keep in mind that statistics do not mean anything if you happen to be on the wrong side of them.  Even if a procedure has a 1  (one) in a 1,000,000 (million) chance of going wrong, it you happen to be that one..Also, keep in mind that by statistics, you have more of a chance of having something go wrong when taking medications.  Who should not have this procedure? If you are on a blood thinning medication (e.g. Coumadin, Plavix, see list of "Blood Thinners"), or if you have an active infection going on, you should not have the procedure.  If you are taking any blood thinners, please inform your physician.  How should I prepare for this procedure?  Do not eat or drink anything at least six hours prior to the procedure.  Bring a driver with you .  It cannot be a taxi.  Come accompanied by an adult that can drive you back, and that is strong enough to help you if your legs get weak or numb from the local anesthetic.  Take all of your medicines the morning of the procedure with just enough water to swallow them.  If you have diabetes, make sure that you are scheduled to have your procedure done first thing in the morning, whenever possible.  If you have diabetes, take only half of your insulin dose and notify our nurse that you have done so as soon as you arrive at the clinic.  If you are diabetic, but only take blood sugar pills (oral hypoglycemic), then do not take them on the morning of your procedure.  You may take them after you have had the procedure.  Do not take aspirin or any aspirin-containing medications, at least eleven (11) days prior to the procedure.  They may prolong bleeding.  Wear loose fitting clothing that may be easy to take off and that you would not mind if it got stained with Betadine or blood.  Do not wear any jewelry or perfume  Remove any nail coloring.  It will interfere with some of our monitoring equipment.  NOTE: Remember that this is not meant to be interpreted as a complete list of all possible complications.  Unforeseen problems may occur.  BLOOD THINNERS The following drugs  contain aspirin or other products, which can cause increased bleeding during surgery and should not be taken for 2 weeks prior to and 1 week after surgery.  If you should need take something for relief of minor pain, you may take acetaminophen which is found in Tylenol,m Datril, Anacin-3 and Panadol. It is not blood thinner. The products listed below are.  Do not take any of the products listed below in addition to any listed on your instruction sheet.  A.P.C or A.P.C with Codeine Codeine Phosphate Capsules #3 Ibuprofen Ridaura  ABC compound Congesprin Imuran rimadil  Advil Cope Indocin Robaxisal  Alka-Seltzer Effervescent Pain Reliever and Antacid Coricidin or Coricidin-D  Indomethacin Rufen    Alka-Seltzer plus Cold Medicine Cosprin Ketoprofen S-A-C Tablets  Anacin Analgesic Tablets or Capsules Coumadin Korlgesic Salflex  Anacin Extra Strength Analgesic tablets or capsules CP-2 Tablets Lanoril Salicylate  Anaprox Cuprimine Capsules Levenox Salocol  Anexsia-D Dalteparin Magan Salsalate  Anodynos Darvon compound Magnesium Salicylate Sine-off  Ansaid Dasin Capsules Magsal Sodium Salicylate  Anturane Depen Capsules Marnal Soma  APF Arthritis pain formula Dewitt's Pills Measurin Stanback  Argesic Dia-Gesic Meclofenamic Sulfinpyrazone  Arthritis Bayer Timed Release Aspirin Diclofenac Meclomen Sulindac  Arthritis pain formula Anacin Dicumarol Medipren Supac  Analgesic (Safety coated) Arthralgen Diffunasal Mefanamic Suprofen  Arthritis Strength Bufferin Dihydrocodeine Mepro Compound Suprol  Arthropan liquid Dopirydamole Methcarbomol with Aspirin Synalgos  ASA tablets/Enseals Disalcid Micrainin Tagament  Ascriptin Doan's Midol Talwin  Ascriptin A/D Dolene Mobidin Tanderil  Ascriptin Extra Strength Dolobid Moblgesic Ticlid  Ascriptin with Codeine Doloprin or Doloprin with Codeine Momentum Tolectin  Asperbuf Duoprin Mono-gesic Trendar  Aspergum Duradyne Motrin or Motrin IB Triminicin  Aspirin  plain, buffered or enteric coated Durasal Myochrisine Trigesic  Aspirin Suppositories Easprin Nalfon Trillsate  Aspirin with Codeine Ecotrin Regular or Extra Strength Naprosyn Uracel  Atromid-S Efficin Naproxen Ursinus  Auranofin Capsules Elmiron Neocylate Vanquish  Axotal Emagrin Norgesic Verin  Azathioprine Empirin or Empirin with Codeine Normiflo Vitamin E  Azolid Emprazil Nuprin Voltaren  Bayer Aspirin plain, buffered or children's or timed BC Tablets or powders Encaprin Orgaran Warfarin Sodium  Buff-a-Comp Enoxaparin Orudis Zorpin  Buff-a-Comp with Codeine Equegesic Os-Cal-Gesic   Buffaprin Excedrin plain, buffered or Extra Strength Oxalid   Bufferin Arthritis Strength Feldene Oxphenbutazone   Bufferin plain or Extra Strength Feldene Capsules Oxycodone with Aspirin   Bufferin with Codeine Fenoprofen Fenoprofen Pabalate or Pabalate-SF   Buffets II Flogesic Panagesic   Buffinol plain or Extra Strength Florinal or Florinal with Codeine Panwarfarin   Buf-Tabs Flurbiprofen Penicillamine   Butalbital Compound Four-way cold tablets Penicillin   Butazolidin Fragmin Pepto-Bismol   Carbenicillin Geminisyn Percodan   Carna Arthritis Reliever Geopen Persantine   Carprofen Gold's salt Persistin   Chloramphenicol Goody's Phenylbutazone   Chloromycetin Haltrain Piroxlcam   Clmetidine heparin Plaquenil   Cllnoril Hyco-pap Ponstel   Clofibrate Hydroxy chloroquine Propoxyphen         Before stopping any of these medications, be sure to consult the physician who ordered them.  Some, such as Coumadin (Warfarin) are ordered to prevent or treat serious conditions such as "deep thrombosis", "pumonary embolisms", and other heart problems.  The amount of time that you may need off of the medication may also vary with the medication and the reason for which you were taking it.  If you are taking any of these medications, please make sure you notify your pain physician before you undergo any  procedures.         Epidural Steroid Injection Patient Information  Description: The epidural space surrounds the nerves as they exit the spinal cord.  In some patients, the nerves can be compressed and inflamed by a bulging disc or a tight spinal canal (spinal stenosis).  By injecting steroids into the epidural space, we can bring irritated nerves into direct contact with a potentially helpful medication.  These steroids act directly on the irritated nerves and can reduce swelling and inflammation which often leads to decreased pain.  Epidural steroids may be injected anywhere along the spine and from the neck to the low back depending upon the location of your pain.   After numbing the skin with local anesthetic (like Novocaine), a small needle is passed   into the epidural space slowly.  You may experience a sensation of pressure while this is being done.  The entire block usually last less than 10 minutes.  Conditions which may be treated by epidural steroids:   Low back and leg pain  Neck and arm pain  Spinal stenosis  Post-laminectomy syndrome  Herpes zoster (shingles) pain  Pain from compression fractures  Preparation for the injection:  1. Do not eat any solid food or dairy products within 8 hours of your appointment.  2. You may drink clear liquids up to 3 hours before appointment.  Clear liquids include water, black coffee, juice or soda.  No milk or cream please. 3. You may take your regular medication, including pain medications, with a sip of water before your appointment  Diabetics should hold regular insulin (if taken separately) and take 1/2 normal NPH dos the morning of the procedure.  Carry some sugar containing items with you to your appointment. 4. A driver must accompany you and be prepared to drive you home after your procedure.  5. Bring all your current medications with your. 6. An IV may be inserted and sedation may be given at the discretion of the  physician.   7. A blood pressure cuff, EKG and other monitors will often be applied during the procedure.  Some patients may need to have extra oxygen administered for a short period. 8. You will be asked to provide medical information, including your allergies, prior to the procedure.  We must know immediately if you are taking blood thinners (like Coumadin/Warfarin)  Or if you are allergic to IV iodine contrast (dye). We must know if you could possible be pregnant.  Possible side-effects:  Bleeding from needle site  Infection (rare, may require surgery)  Nerve injury (rare)  Numbness & tingling (temporary)  Difficulty urinating (rare, temporary)  Spinal headache ( a headache worse with upright posture)  Light -headedness (temporary)  Pain at injection site (several days)  Decreased blood pressure (temporary)  Weakness in arm/leg (temporary)  Pressure sensation in back/neck (temporary)  Call if you experience:  Fever/chills associated with headache or increased back/neck pain.  Headache worsened by an upright position.  New onset weakness or numbness of an extremity below the injection site  Hives or difficulty breathing (go to the emergency room)  Inflammation or drainage at the infection site  Severe back/neck pain  Any new symptoms which are concerning to you  Please note:  Although the local anesthetic injected can often make your back or neck feel good for several hours after the injection, the pain will likely return.  It takes 3-7 days for steroids to work in the epidural space.  You may not notice any pain relief for at least that one week.  If effective, we will often do a series of three injections spaced 3-6 weeks apart to maximally decrease your pain.  After the initial series, we generally will wait several months before considering a repeat injection of the same type.  If you have any questions, please call [phone] [facility] 

## 2016-06-23 NOTE — Progress Notes (Signed)
Safety precautions to be maintained throughout the outpatient stay will include: orient to surroundings, keep bed in low position, maintain call bell within reach at all times, provide assistance with transfer out of bed and ambulation.  

## 2016-06-23 NOTE — Progress Notes (Signed)
Chief complaint: Severe low back pain with radiation into the right hip and buttock region.   Previous Procedure: Bilateral lumbar medial branch block to the facet at L3-4 L4-5 L5-S1 and S1 under fluoroscopic guidance with moderate sedation                                    Right Side lumbar facet medial branch block at L2-3 and L3-4 L4-5 L5-S1 and S1 under fluoroscopic guidance with moderate sedation  History of present illness:Madison Tobie PoetSue Shafferpresents for reevaluation today. She had recent low back decompressive surgery with a fusion by Dr. Lovell SheehanJenkins. She reports that her low back pain is much better but she is experiencing some calf cramping and radicular type pain affecting the right posterior lateral leg. Otherwise her bowel bladder function is better. This was impaired and precipitated her recent back surgery. Otherwise she is tolerating her medications well and has had good success with epidural steroids limiting radicular pain that she has experienced in the past.  . Current Outpatient Prescriptions:  .  atorvastatin (LIPITOR) 20 MG tablet, , Disp: , Rfl:  .  cyanocobalamin (V-R VITAMIN B-12) 500 MCG tablet, Take 500 mcg by mouth daily. , Disp: , Rfl:  .  dextromethorphan-guaiFENesin (MUCINEX DM) 30-600 MG 12hr tablet, Take 1 tablet by mouth 2 (two) times daily., Disp: , Rfl:  .  DULoxetine (CYMBALTA) 30 MG capsule, Take 30 mg in am and 60 mg at night., Disp: , Rfl:  .  fluticasone (FLONASE) 50 MCG/ACT nasal spray, Place 2 sprays into both nostrils daily. , Disp: , Rfl:  .  furosemide (LASIX) 20 MG tablet, Take 20 mg by mouth 3 (three) times daily as needed for fluid., Disp: , Rfl:  .  HYDROcodone-acetaminophen (NORCO/VICODIN) 5-325 MG tablet, Take 1 tablet by mouth every 6 (six) hours as needed for moderate pain., Disp: 90 tablet, Rfl: 0 .  loratadine-pseudoephedrine (CLARITIN-D 12-HOUR) 5-120 MG per tablet, Take 1 tablet by mouth daily. , Disp: , Rfl:  .  methocarbamol (ROBAXIN) 750 MG  tablet, , Disp: , Rfl:  .  metoprolol (LOPRESSOR) 50 MG tablet, Take 50 mg by mouth 2 (two) times daily. , Disp: , Rfl:  .  naproxen (NAPROSYN) 500 MG tablet, , Disp: , Rfl:  .  Olopatadine HCl (PATADAY) 0.2 % SOLN, Place 1 drop into both eyes daily as needed. For dry eyes, Disp: , Rfl:  .  potassium chloride SA (K-DUR,KLOR-CON) 20 MEQ tablet, Take 20 mEq by mouth daily., Disp: , Rfl:  .  atorvastatin (LIPITOR) 40 MG tablet, Take 40 mg by mouth daily., Disp: , Rfl:  .  cephALEXin (KEFLEX) 500 MG capsule, , Disp: , Rfl:  .  cyclobenzaprine (FLEXERIL) 10 MG tablet, Take 1 tablet (10 mg total) by mouth 3 (three) times daily as needed for muscle spasms. (Patient not taking: Reported on 06/23/2016), Disp: 50 tablet, Rfl: 1 .  diazepam (VALIUM) 5 MG tablet, , Disp: , Rfl:  .  docusate sodium (COLACE) 100 MG capsule, Take 1 capsule (100 mg total) by mouth 2 (two) times daily. (Patient not taking: Reported on 06/23/2016), Disp: 60 capsule, Rfl: 0 .  DULoxetine (CYMBALTA) 30 MG capsule, Take 30 mg by mouth every morning., Disp: , Rfl:  .  DULoxetine (CYMBALTA) 60 MG capsule, Take 60 mg by mouth every evening. Patient states she also takes a 30mg  in the morning per patient, Disp: , Rfl:  .  fluticasone (FLONASE) 50 MCG/ACT nasal spray, Place into the nose., Disp: , Rfl:  .  naproxen (NAPROSYN) 500 MG tablet, Take by mouth., Disp: , Rfl:  .  oxyCODONE-acetaminophen (PERCOCET) 7.5-325 MG tablet, , Disp: , Rfl:  .  oxyCODONE-acetaminophen (PERCOCET/ROXICET) 5-325 MG tablet, Take 1-2 tablets by mouth every 4 (four) hours as needed for moderate pain. (Patient not taking: Reported on 06/23/2016), Disp: 50 tablet, Rfl: 0 .  potassium chloride (K-DUR) 10 MEQ tablet, , Disp: , Rfl:   BP 127/66   Pulse 72   Temp 98.1 F (36.7 C)   Ht 4\' 11"  (1.499 m)   Wt 198 lb (89.8 kg)   SpO2 100%   BMI 39.99 kg/m   Patient Active Problem List   Diagnosis Date Noted  . Spondylolisthesis of lumbar region 05/26/2016  .  Facet arthritis of cervical region (HCC) 03/05/2015  . DDD (degenerative disc disease), lumbar 03/05/2015    Allergies  Allergen Reactions  . Eggs Or Egg-Derived Products Anaphylaxis    Egg albumin  . Morphine And Related     unknown  . Spiriva Handihaler [Tiotropium Bromide Monohydrate] Other (See Comments)    bronchiospasms  . Sulfa Antibiotics Hives  . Penicillins Rash    Has patient had a PCN reaction causing immediate rash, facial/tongue/throat swelling, SOB or lightheadedness with hypotension: YES Has patient had a PCN reaction causing severe rash involving mucus membranes or skin necrosis: NO Has patient had a PCN reaction that required hospitalization NO Has patient had a PCN reaction occurring within the last 10 years: NO If all of the above answers are "NO", then may proceed with Cephalosporin use.  . Voltaren [Diclofenac] Rash      Physical exam: Patient is alert and oriented 3 and cooperative and compliant.  Heart is regular rate and rhythm without murmur  Lungs are clear to auscultation  Inspection of the low back reveals no significant paraspinous muscle tenderness . She has a well-healed midline scar with some mild erythema bilaterally but this appears to be healing well without evidence of further irritation with some mild tenderness as expected. She ambulates with an antalgic gait with some mild pain going from seated to standing.  Assessment:  #1 bilateral  facet arthropathy  #2 degenerative disc disease now status post laminectomy with fusion  #3 myofascial low back pain  #4 chronic opioid management  Plan:  #1 we will have her return to clinic in 2 months for reevaluation possible caudal epidural for the radiculitis in the L5 distribution right side that she continues to have. Hopefully this will abate prior to her next evaluation and we will request approval by Dr. Lovell Sheehan prior to proceeding with a caudal epidural steroid injection.  #2 continue  with back stretching strengthening exercises as tolerated and I have reiterated that this is important to help reduce spasming in the lower back.  #3. We will refill her medications at this time and hopefully be able to wean these to a minimum in the near future as her back improves.

## 2016-07-02 LAB — TOXASSURE SELECT 13 (MW), URINE

## 2016-08-20 ENCOUNTER — Ambulatory Visit: Payer: BLUE CROSS/BLUE SHIELD | Attending: Anesthesiology | Admitting: Anesthesiology

## 2016-08-20 ENCOUNTER — Encounter: Payer: Self-pay | Admitting: Anesthesiology

## 2016-08-20 VITALS — BP 112/57 | HR 69 | Temp 98.0°F | Resp 18 | Ht 59.0 in | Wt 197.0 lb

## 2016-08-20 DIAGNOSIS — M545 Low back pain: Secondary | ICD-10-CM | POA: Diagnosis not present

## 2016-08-20 DIAGNOSIS — M5386 Other specified dorsopathies, lumbar region: Secondary | ICD-10-CM

## 2016-08-20 DIAGNOSIS — M5441 Lumbago with sciatica, right side: Secondary | ICD-10-CM | POA: Insufficient documentation

## 2016-08-20 DIAGNOSIS — G8929 Other chronic pain: Secondary | ICD-10-CM | POA: Insufficient documentation

## 2016-08-20 DIAGNOSIS — M5136 Other intervertebral disc degeneration, lumbar region: Secondary | ICD-10-CM

## 2016-08-20 MED ORDER — HYDROCODONE-ACETAMINOPHEN 5-325 MG PO TABS: 1.0000 | ORAL_TABLET | Freq: Four times a day (QID) | ORAL | 0 refills | Status: DC | PRN

## 2016-08-20 MED ORDER — DEXAMETHASONE SODIUM PHOSPHATE 4 MG/ML IJ SOLN
INTRAMUSCULAR | Status: AC
Start: 2016-08-20 — End: 2016-08-20
  Administered 2016-08-20: 15:00:00
  Filled 2016-08-20: qty 2

## 2016-08-20 MED ORDER — ROPIVACAINE HCL 2 MG/ML IJ SOLN
INTRAMUSCULAR | Status: AC
  Administered 2016-08-20: 15:00:00
  Filled 2016-08-20: qty 10

## 2016-08-20 NOTE — Progress Notes (Signed)
Safety precautions to be maintained throughout the outpatient stay will include: orient to surroundings, keep bed in low position, maintain call bell within reach at all times, provide assistance with transfer out of bed and ambulation.  

## 2016-08-21 NOTE — Progress Notes (Signed)
Chief complaint: Severe low back pain with radiation into the right hip and buttock region. Procedure: None  Previous Procedure: Bilateral lumbar medial branch block to the facet at L3-4 L4-5 L5-S1 and S1 under fluoroscopic guidance with moderate sedation                                    Right Side lumbar facet medial branch block at L2-3 and L3-4 L4-5 L5-S1 and S1 under fluoroscopic guidance with moderate sedation  History of present illness:Madison [name] for reevaluation and was last seen in October. She states that she is having some right hip pain and continues to have some intermittent radiation of pain down the right posterior and lateral leg. In the past she has had caudal epidural steroids for her low back pain giving her 50% pain relief in the low back and right leg. She also has had some facet injections as well and these have been beneficial. She was hoping to undergo injection today however has had a recent bronchitis and has been on antibiotics for this and a sinusitis. She's tolerating her medications well at the 5 mg Vicodin dose 3 times a day. Otherwise the quality characteristic addition we should've her pain have been stable. . Current Outpatient Prescriptions:  .  amoxicillin-clavulanate (AUGMENTIN) 875-125 MG tablet, Take by mouth., Disp: , Rfl:  .  atorvastatin (LIPITOR) 40 MG tablet, Take 40 mg by mouth daily., Disp: , Rfl:  .  cyanocobalamin (V-R VITAMIN B-12) 500 MCG tablet, Take 500 mcg by mouth daily. , Disp: , Rfl:  .  dextromethorphan-guaiFENesin (MUCINEX DM) 30-600 MG 12hr tablet, Take 1 tablet by mouth 2 (two) times daily., Disp: , Rfl:  .  DULoxetine (CYMBALTA) 30 MG capsule, Take 30 mg by mouth every morning., Disp: , Rfl:  .  DULoxetine (CYMBALTA) 60 MG capsule, Take 60 mg by mouth every evening. Patient states she also takes a 30mg  in the morning per patient, Disp: , Rfl:  .  fluticasone (FLONASE) 50 MCG/ACT nasal spray, Place 2 sprays into both nostrils  daily. , Disp: , Rfl:  .  furosemide (LASIX) 20 MG tablet, Take 20 mg by mouth 3 (three) times daily as needed for fluid., Disp: , Rfl:  .  loratadine-pseudoephedrine (CLARITIN-D 12-HOUR) 5-120 MG per tablet, Take 1 tablet by mouth daily. , Disp: , Rfl:  .  methocarbamol (ROBAXIN) 750 MG tablet, , Disp: , Rfl:  .  metoprolol (LOPRESSOR) 50 MG tablet, Take 50 mg by mouth 2 (two) times daily. , Disp: , Rfl:  .  naproxen (NAPROSYN) 500 MG tablet, , Disp: , Rfl:  .  nystatin (MYCOSTATIN) 100000 UNIT/ML suspension, , Disp: , Rfl:  .  Olopatadine HCl (PATADAY) 0.2 % SOLN, Place 1 drop into both eyes daily as needed. For dry eyes, Disp: , Rfl:  .  potassium chloride SA (K-DUR,KLOR-CON) 20 MEQ tablet, Take 20 mEq by mouth daily., Disp: , Rfl:  .  tamsulosin (FLOMAX) 0.4 MG CAPS capsule, , Disp: , Rfl:  .  amoxicillin-clavulanate (AUGMENTIN) 875-125 MG tablet, , Disp: , Rfl:  .  atorvastatin (LIPITOR) 20 MG tablet, , Disp: , Rfl:  .  cephALEXin (KEFLEX) 500 MG capsule, , Disp: , Rfl:  .  cyclobenzaprine (FLEXERIL) 10 MG tablet, Take 1 tablet (10 mg total) by mouth 3 (three) times daily as needed for muscle spasms. (Patient not taking: Reported on 08/20/2016), Disp: 50 tablet,  Rfl: 1 .  diazepam (VALIUM) 5 MG tablet, , Disp: , Rfl:  .  docusate sodium (COLACE) 100 MG capsule, Take 1 capsule (100 mg total) by mouth 2 (two) times daily. (Patient not taking: Reported on 08/20/2016), Disp: 60 capsule, Rfl: 0 .  DULoxetine (CYMBALTA) 30 MG capsule, Take 30 mg in am and 60 mg at night., Disp: , Rfl:  .  fluticasone (FLONASE) 50 MCG/ACT nasal spray, Place into the nose., Disp: , Rfl:  .  HYDROcodone-acetaminophen (NORCO/VICODIN) 5-325 MG tablet, Take 1 tablet by mouth every 6 (six) hours as needed for moderate pain or severe pain., Disp: 90 tablet, Rfl: 0 .  naproxen (NAPROSYN) 500 MG tablet, Take by mouth., Disp: , Rfl:  .  nitrofurantoin, macrocrystal-monohydrate, (MACROBID) 100 MG capsule, , Disp: , Rfl:  .   nystatin (MYCOSTATIN) 100000 UNIT/ML suspension, Swish and swallow 5 mLs 4 (four) times daily for 10 days., Disp: , Rfl:  .  oxyCODONE-acetaminophen (PERCOCET) 7.5-325 MG tablet, , Disp: , Rfl:  .  oxyCODONE-acetaminophen (PERCOCET/ROXICET) 5-325 MG tablet, Take 1-2 tablets by mouth every 4 (four) hours as needed for moderate pain. (Patient not taking: Reported on 08/20/2016), Disp: 50 tablet, Rfl: 0 .  potassium chloride (K-DUR) 10 MEQ tablet, , Disp: , Rfl:  .  predniSONE (DELTASONE) 20 MG tablet, , Disp: , Rfl:   BP (!) 112/57   Pulse 69   Temp 98 F (36.7 C)   Resp 18   Ht 4\' 11"  (1.499 m)   Wt 197 lb (89.4 kg)   SpO2 99%   BMI 39.79 kg/m   Patient Active Problem List   Diagnosis Date Noted  . Spondylolisthesis of lumbar region 05/26/2016  . Facet arthritis of cervical region (HCC) 03/05/2015  . DDD (degenerative disc disease), lumbar 03/05/2015    Allergies  Allergen Reactions  . Eggs Or Egg-Derived Products Anaphylaxis    Egg albumin  . Morphine And Related     unknown  . Spiriva Handihaler [Tiotropium Bromide Monohydrate] Other (See Comments)    bronchiospasms  . Sulfa Antibiotics Hives  . Penicillins Rash    Has patient had a PCN reaction causing immediate rash, facial/tongue/throat swelling, SOB or lightheadedness with hypotension: YES Has patient had a PCN reaction causing severe rash involving mucus membranes or skin necrosis: NO Has patient had a PCN reaction that required hospitalization NO Has patient had a PCN reaction occurring within the last 10 years: NO If all of the above answers are "NO", then may proceed with Cephalosporin use.  . Voltaren [Diclofenac] Rash      Physical exam: Patient is alert and oriented 3 and cooperative and compliant.  Heart is regular rate and rhythm without murmur  Lungs are clear to auscultation  Inspection of the low back reveals no significant paraspinous muscle tenderness . She has some pain on extension. She  continues to have a positive straight leg raise on the right side. Assessment:  #1 bilateral  facet arthropathy  #2 degenerative disc disease now status post laminectomy with fusion with L5-S1 radiculitis right  #3 myofascial low back pain  #4 chronic opioid management  Plan:  #1 we will have her return to clinic in 1 months for reevaluation possible caudal epidural for the radiculitis in the L5 distribution right side that she continues to have. Hopefully this will abate prior to her next evaluation and we will request approval by Dr. Lovell SheehanJenkins prior to proceeding with a caudal epidural steroid injection.  #2 continue with back  stretching strengthening exercises as tolerated and I have reiterated that this is important to help reduce spasming in the lower back.  #3. We will refill her medications at this time and hopefully be able to wean these to a minimum in the near future as her back improves. We have also reviewed the practitioner database information and she did receive some medications from her neurosurgeon that this was transient and I find it to be acceptable.

## 2016-08-28 ENCOUNTER — Telehealth: Payer: Self-pay | Admitting: Anesthesiology

## 2016-08-28 NOTE — Telephone Encounter (Signed)
Patient called stating she dropped meds into sink and several of her meds melted, only has 21 left, can dr Pernell Dupreadams write script for one that melted, please let patient know.

## 2016-08-29 NOTE — Telephone Encounter (Signed)
Talked with pt about what Dr Pernell DupreAdams discussed reviewed with Patient about not being able to refill meds. Patient understands and states she doesn't take that many . Instructed to call if she needs us or fills like she is going thru withdrawals

## 2016-08-29 NOTE — Telephone Encounter (Signed)
Pt w as in the bathroom taking medication when her granddaughter asked her if she could wipe her bottom. When she turned to help her she knocked pills in sink. She stated that she just brushed her teeth and sink was wet and pills melted. States she has 21 out of 90 left and wanted to know if Dr Pernell DupreAdams would replace them. Talked with patient and told her that more than likely he would not because it is her responsibility to keep up with her medication. Will notify Dr Pernell DupreAdams and will let pt know.

## 2016-08-29 NOTE — Telephone Encounter (Signed)
Spoke with Dr Pernell DupreAdams. Unable to write a prescription. States if she brought pills in that he might could write but unable to determine if this happened. He would like for her to cut back on medications that she has left. And to let office know if needed.

## 2016-09-22 ENCOUNTER — Ambulatory Visit (HOSPITAL_BASED_OUTPATIENT_CLINIC_OR_DEPARTMENT_OTHER): Payer: BLUE CROSS/BLUE SHIELD | Admitting: Anesthesiology

## 2016-09-22 ENCOUNTER — Ambulatory Visit
Admission: RE | Admit: 2016-09-22 | Discharge: 2016-09-22 | Disposition: A | Discharge status: Home / Self Care | Payer: BLUE CROSS/BLUE SHIELD | Source: Ambulatory Visit | Attending: Anesthesiology | Admitting: Anesthesiology

## 2016-09-22 ENCOUNTER — Other Ambulatory Visit: Payer: Self-pay | Admitting: Anesthesiology

## 2016-09-22 ENCOUNTER — Encounter: Payer: Self-pay | Admitting: Anesthesiology

## 2016-09-22 VITALS — BP 122/58 | HR 72 | Temp 97.8°F | Resp 16 | Ht 59.0 in | Wt 200.0 lb

## 2016-09-22 DIAGNOSIS — M5441 Lumbago with sciatica, right side: Secondary | ICD-10-CM

## 2016-09-22 DIAGNOSIS — R52 Pain, unspecified: Secondary | ICD-10-CM

## 2016-09-22 DIAGNOSIS — G8929 Other chronic pain: Secondary | ICD-10-CM | POA: Diagnosis not present

## 2016-09-22 DIAGNOSIS — M5431 Sciatica, right side: Secondary | ICD-10-CM | POA: Diagnosis present

## 2016-09-22 DIAGNOSIS — M5136 Other intervertebral disc degeneration, lumbar region: Secondary | ICD-10-CM | POA: Insufficient documentation

## 2016-09-22 MED ORDER — LIDOCAINE HCL (CARDIAC) 20 MG/ML IV SOLN
INTRAVENOUS | Status: AC
  Filled 2016-09-22: qty 5

## 2016-09-22 MED ORDER — TRIAMCINOLONE ACETONIDE 40 MG/ML IJ SUSP
40.0000 mg | Freq: Once | INTRAMUSCULAR | Status: AC
  Administered 2016-09-22: 40 mg
  Filled 2016-09-22: qty 1

## 2016-09-22 MED ORDER — MIDAZOLAM HCL 5 MG/5ML IJ SOLN
5.0000 mg | Freq: Once | INTRAMUSCULAR | Status: AC
  Administered 2016-09-22: 1 mg via INTRAVENOUS
  Filled 2016-09-22: qty 5

## 2016-09-22 MED ORDER — SODIUM CHLORIDE 0.9 % IJ SOLN
INTRAMUSCULAR | Status: AC
  Filled 2016-09-22: qty 10

## 2016-09-22 MED ORDER — ROPIVACAINE HCL 2 MG/ML IJ SOLN
10.0000 mL | Freq: Once | INTRAMUSCULAR | Status: AC
  Administered 2016-09-22: 10 mL via EPIDURAL
  Filled 2016-09-22: qty 10

## 2016-09-22 MED ORDER — LIDOCAINE HCL (PF) 1 % IJ SOLN
5.0000 mL | Freq: Once | INTRAMUSCULAR | Status: AC
  Administered 2016-09-22: 5 mL via SUBCUTANEOUS
  Filled 2016-09-22: qty 5

## 2016-09-22 MED ORDER — HYDROCODONE-ACETAMINOPHEN 5-325 MG PO TABS
1.0000 | ORAL_TABLET | Freq: Four times a day (QID) | ORAL | 0 refills | Status: DC | PRN
Start: 2016-09-22 — End: 2016-10-22

## 2016-09-22 MED ORDER — HYDROCODONE-ACETAMINOPHEN 5-325 MG PO TABS: 1.0000 | ORAL_TABLET | Freq: Four times a day (QID) | ORAL | 0 refills | Status: DC | PRN

## 2016-09-22 MED ORDER — IOPAMIDOL (ISOVUE-M 200) INJECTION 41%
20.0000 mL | Freq: Once | INTRAMUSCULAR | Status: DC | PRN
  Administered 2016-09-22: 20 mL
  Filled 2016-09-22: qty 20

## 2016-09-22 MED ORDER — SODIUM CHLORIDE 0.9% FLUSH
10.0000 mL | Freq: Once | INTRAVENOUS | Status: AC
  Administered 2016-09-22: 10 mL

## 2016-09-22 MED ORDER — LACTATED RINGERS IV SOLN: 1000.0000 mL | INTRAVENOUS | Status: DC

## 2016-09-22 MED ORDER — IOPAMIDOL (ISOVUE-M 200) INJECTION 41%
INTRAMUSCULAR | Status: AC
  Filled 2016-09-22: qty 10

## 2016-09-22 NOTE — Patient Instructions (Addendum)
Pain Management Discharge Instructions  General Discharge Instructions :  If you need to reach your doctor call: Monday-Friday 8:00 am - 4:00 pm at [phone] or toll free [phone].  After clinic hours [phone] to have operator reach doctor.  Bring all of your medication bottles to all your appointments in the pain clinic.  To cancel or reschedule your appointment with Pain Management please remember to call 24 hours in advance to avoid a fee.  Refer to the educational materials which you have been given on: General Risks, I had my Procedure. Discharge Instructions, Post Sedation.  Post Procedure Instructions:  The drugs you were given will stay in your system until tomorrow, so for the next 24 hours you should not drive, make any legal decisions or drink any alcoholic beverages.  You may eat anything you prefer, but it is better to start with liquids then soups and crackers, and gradually work up to solid foods.  Please notify your doctor immediately if you have any unusual bleeding, trouble breathing or pain that is not related to your normal pain.  Depending on the type of procedure that was done, some parts of your body may feel week and/or numb.  This usually clears up by tonight or the next day.  Walk with the use of an assistive device or accompanied by an adult for the 24 hours.  You may use ice on the affected area for the first 24 hours.  Put ice in a Ziploc bag and cover with a towel and place against area 15 minutes on 15 minutes off.  You may switch to heat after 24 hours.Epidural Steroid Injection An epidural steroid injection is a shot of steroid medicine and numbing medicine that is given into the space between the spinal cord and the bones in your back (epidural space). The shot helps relieve pain caused by an irritated or swollen nerve root. The amount of pain relief you get from the injection depends on what is causing the nerve to be swollen and irritated,  and how long your pain lasts. You are more likely to benefit from this injection if your pain is strong and comes on suddenly rather than if you have had pain for a long time. Tell a health care provider about:  Any allergies you have.  All medicines you are taking, including vitamins, herbs, eye drops, creams, and over-the-counter medicines.  Any problems you or family members have had with anesthetic medicines.  Any blood disorders you have.  Any surgeries you have had.  Any medical conditions you have.  Whether you are pregnant or may be pregnant. What are the risks? Generally, this is a safe procedure. However, problems may occur, including:  Headache.  Bleeding.  Infection.  Allergic reaction to medicines.  Damage to your nerves.  What happens before the procedure? Staying hydrated Follow instructions from your health care provider about hydration, which may include:  Up to 2 hours before the procedure - you may continue to drink clear liquids, such as water, clear fruit juice, black coffee, and plain tea.  Eating and drinking restrictions Follow instructions from your health care provider about eating and drinking, which may include:  8 hours before the procedure - stop eating heavy meals or foods such as meat, fried foods, or fatty foods.  6 hours before the procedure - stop eating light meals or foods, such as toast or cereal.  6 hours before the procedure - stop drinking milk or drinks that contain milk.    2 hours before the procedure - stop drinking clear liquids.  Medicine  You may be given medicines to lower anxiety.  Ask your health care provider about: ? Changing or stopping your regular medicines. This is especially important if you are taking diabetes medicines or blood thinners. ? Taking medicines such as aspirin and ibuprofen. These medicines can thin your blood. Do not take these medicines before your procedure if your health care provider  instructs you not to. General instructions  Plan to have someone take you home from the hospital or clinic. What happens during the procedure?  You may receive a medicine to help you relax (sedative).  You will be asked to lie on your abdomen.  The injection site will be cleaned.  A numbing medicine (local anesthetic) will be used to numb the injection site.  A needle will be inserted through your skin into the epidural space. You may feel some discomfort when this happens. An X-ray machine will be used to make sure the needle is put as close as possible to the affected nerve.  A steroid medicine and a local anesthetic will be injected into the epidural space.  The needle will be removed.  A bandage (dressing) will be put over the injection site. What happens after the procedure?  Your blood pressure, heart rate, breathing rate, and blood oxygen level will be monitored until the medicines you were given have worn off.  Your arm or leg may feel weak or numb for a few hours.  The injection site may feel sore.  Do not drive for 24 hours if you received a sedative. This information is not intended to replace advice given to you by your health care provider. Make sure you discuss any questions you have with your health care provider. Document Released: 12/02/2007 Document Revised: 02/06/2016 Document Reviewed: 12/11/2015 Elsevier Interactive Patient Education  2017 [employer] GENERAL RISKS AND COMPLICATIONS  What are the risk, side effects and possible complications? Generally speaking, most procedures are safe.  However, with any procedure there are risks, side effects, and the possibility of complications.  The risks and complications are dependent upon the sites that are lesioned, or the type of nerve block to be performed.  The closer the procedure is to the spine, the more serious the risks are.  Great care is taken when placing the radio frequency needles, block needles or  lesioning probes, but sometimes complications can occur. 1. Infection: Any time there is an injection through the skin, there is a risk of infection.  This is why sterile conditions are used for these blocks.  There are four possible types of infection. 1. Localized skin infection. 2. Central Nervous System Infection-This can be in the form of Meningitis, which can be deadly. 3. Epidural Infections-This can be in the form of an epidural abscess, which can cause pressure inside of the spine, causing compression of the spinal cord with subsequent paralysis. This would require an emergency surgery to decompress, and there are no guarantees that the patient would recover from the paralysis. 4. Discitis-This is an infection of the intervertebral discs.  It occurs in about 1% of discography procedures.  It is difficult to treat and it may lead to surgery.        2. Pain: the needles have to go through skin and soft tissues, will cause soreness.       3. Damage to internal structures:  The nerves to be lesioned may be near blood vessels or      other nerves which can be potentially damaged.       4. Bleeding: Bleeding is more common if the patient is taking blood thinners such as  aspirin, Coumadin, Ticiid, Plavix, etc., or if he/she have some genetic predisposition  such as hemophilia. Bleeding into the spinal canal can cause compression of the spinal  cord with subsequent paralysis.  This would require an emergency surgery to  decompress and there are no guarantees that the patient would recover from the  paralysis.       5. Pneumothorax:  Puncturing of a lung is a possibility, every time a needle is introduced in  the area of the chest or upper back.  Pneumothorax refers to free air around the  collapsed lung(s), inside of the thoracic cavity (chest cavity).  Another two possible  complications related to a similar event would include: Hemothorax and Chylothorax.   These are variations of the Pneumothorax,  where instead of air around the collapsed  lung(s), you may have blood or chyle, respectively.       6. Spinal headaches: They may occur with any procedures in the area of the spine.       7. Persistent CSF (Cerebro-Spinal Fluid) leakage: This is a rare problem, but may occur  with prolonged intrathecal or epidural catheters either due to the formation of a fistulous  track or a dural tear.       8. Nerve damage: By working so close to the spinal cord, there is always a possibility of  nerve damage, which could be as serious as a permanent spinal cord injury with  paralysis.       9. Death:  Although rare, severe deadly allergic reactions known as "Anaphylactic  reaction" can occur to any of the medications used.      10. Worsening of the symptoms:  We can always make thing worse.  What are the chances of something like this happening? Chances of any of this occuring are extremely low.  By statistics, you have more of a chance of getting killed in a motor vehicle accident: while driving to the hospital than any of the above occurring .  Nevertheless, you should be aware that they are possibilities.  In general, it is similar to taking a shower.  Everybody knows that you can slip, hit your head and get killed.  Does that mean that you should not shower again?  Nevertheless always keep in mind that statistics do not mean anything if you happen to be on the wrong side of them.  Even if a procedure has a 1 (one) in a 1,000,000 (million) chance of going wrong, it you happen to be that one..Also, keep in mind that by statistics, you have more of a chance of having something go wrong when taking medications.  Who should not have this procedure? If you are on a blood thinning medication (e.g. Coumadin, Plavix, see list of "Blood Thinners"), or if you have an active infection going on, you should not have the procedure.  If you are taking any blood thinners, please inform your physician.  How should I prepare  for this procedure?  Do not eat or drink anything at least six hours prior to the procedure.  Bring a driver with you .  It cannot be a taxi.  Come accompanied by an adult that can drive you back, and that is strong enough to help you if your legs get weak or numb from the local anesthetic.  Take   all of your medicines the morning of the procedure with just enough water to swallow them.  If you have diabetes, make sure that you are scheduled to have your procedure done first thing in the morning, whenever possible.  If you have diabetes, take only half of your insulin dose and notify our nurse that you have done so as soon as you arrive at the clinic.  If you are diabetic, but only take blood sugar pills (oral hypoglycemic), then do not take them on the morning of your procedure.  You may take them after you have had the procedure.  Do not take aspirin or any aspirin-containing medications, at least eleven (11) days prior to the procedure.  They may prolong bleeding.  Wear loose fitting clothing that may be easy to take off and that you would not mind if it got stained with Betadine or blood.  Do not wear any jewelry or perfume  Remove any nail coloring.  It will interfere with some of our monitoring equipment.  NOTE: Remember that this is not meant to be interpreted as a complete list of all possible complications.  Unforeseen problems may occur.  BLOOD THINNERS The following drugs contain aspirin or other products, which can cause increased bleeding during surgery and should not be taken for 2 weeks prior to and 1 week after surgery.  If you should need take something for relief of minor pain, you may take acetaminophen which is found in Tylenol,m Datril, Anacin-3 and Panadol. It is not blood thinner. The products listed below are.  Do not take any of the products listed below in addition to any listed on your instruction sheet.  A.P.C or A.P.C with Codeine Codeine Phosphate Capsules #3  Ibuprofen Ridaura  ABC compound Congesprin Imuran rimadil  Advil Cope Indocin Robaxisal  Alka-Seltzer Effervescent Pain Reliever and Antacid Coricidin or Coricidin-D  Indomethacin Rufen  Alka-Seltzer plus Cold Medicine Cosprin Ketoprofen S-A-C Tablets  Anacin Analgesic Tablets or Capsules Coumadin Korlgesic Salflex  Anacin Extra Strength Analgesic tablets or capsules CP-2 Tablets Lanoril Salicylate  Anaprox Cuprimine Capsules Levenox Salocol  Anexsia-D Dalteparin Magan Salsalate  Anodynos Darvon compound Magnesium Salicylate Sine-off  Ansaid Dasin Capsules Magsal Sodium Salicylate  Anturane Depen Capsules Marnal Soma  APF Arthritis pain formula Dewitt's Pills Measurin Stanback  Argesic Dia-Gesic Meclofenamic Sulfinpyrazone  Arthritis Bayer Timed Release Aspirin Diclofenac Meclomen Sulindac  Arthritis pain formula Anacin Dicumarol Medipren Supac  Analgesic (Safety coated) Arthralgen Diffunasal Mefanamic Suprofen  Arthritis Strength Bufferin Dihydrocodeine Mepro Compound Suprol  Arthropan liquid Dopirydamole Methcarbomol with Aspirin Synalgos  ASA tablets/Enseals Disalcid Micrainin Tagament  Ascriptin Doan's Midol Talwin  Ascriptin A/D Dolene Mobidin Tanderil  Ascriptin Extra Strength Dolobid Moblgesic Ticlid  Ascriptin with Codeine Doloprin or Doloprin with Codeine Momentum Tolectin  Asperbuf Duoprin Mono-gesic Trendar  Aspergum Duradyne Motrin or Motrin IB Triminicin  Aspirin plain, buffered or enteric coated Durasal Myochrisine Trigesic  Aspirin Suppositories Easprin Nalfon Trillsate  Aspirin with Codeine Ecotrin Regular or Extra Strength Naprosyn Uracel  Atromid-S Efficin Naproxen Ursinus  Auranofin Capsules Elmiron Neocylate Vanquish  Axotal Emagrin Norgesic Verin  Azathioprine Empirin or Empirin with Codeine Normiflo Vitamin E  Azolid Emprazil Nuprin Voltaren  Bayer Aspirin plain, buffered or children's or timed BC Tablets or powders Encaprin Orgaran Warfarin Sodium    Buff-a-Comp Enoxaparin Orudis Zorpin  Buff-a-Comp with Codeine Equegesic Os-Cal-Gesic   Buffaprin Excedrin plain, buffered or Extra Strength Oxalid   Bufferin Arthritis Strength Feldene Oxphenbutazone   Bufferin plain or Extra Strength Feldene   Capsules Oxycodone with Aspirin   Bufferin with Codeine Fenoprofen Fenoprofen Pabalate or Pabalate-SF   Buffets II Flogesic Panagesic   Buffinol plain or Extra Strength Florinal or Florinal with Codeine Panwarfarin   Buf-Tabs Flurbiprofen Penicillamine   Butalbital Compound Four-way cold tablets Penicillin   Butazolidin Fragmin Pepto-Bismol   Carbenicillin Geminisyn Percodan   Carna Arthritis Reliever Geopen Persantine   Carprofen Gold's salt Persistin   Chloramphenicol Goody's Phenylbutazone   Chloromycetin Haltrain Piroxlcam   Clmetidine heparin Plaquenil   Cllnoril Hyco-pap Ponstel   Clofibrate Hydroxy chloroquine Propoxyphen         Before stopping any of these medications, be sure to consult the physician who ordered them.  Some, such as Coumadin (Warfarin) are ordered to prevent or treat serious conditions such as "deep thrombosis", "pumonary embolisms", and other heart problems.  The amount of time that you may need off of the medication may also vary with the medication and the reason for which you were taking it.  If you are taking any of these medications, please make sure you notify your pain physician before you undergo any procedures.          

## 2016-09-23 ENCOUNTER — Telehealth: Payer: Self-pay | Admitting: *Deleted

## 2016-09-23 NOTE — Telephone Encounter (Signed)
Patient called back this morning and had no problems with the procedure from yesterday. Just soreness.

## 2016-09-24 NOTE — Progress Notes (Signed)
Chief complaint: Severe low back pain with radiation into the right hip and buttock region.  Procedure: Caudal epidural steroid No. 1 under fluoroscopic guidance with moderate sedation  Previous Procedure: Bilateral lumbar medial branch block to the facet at L3-4 L4-5 L5-S1 and S1 under fluoroscopic guidance with moderate sedation                                    Right Side lumbar facet medial branch block at L2-3 and L3-4 L4-5 L5-S1 and S1 under fluoroscopic guidance with moderate sedation  History of present illness:[name] presents for reevaluation. She was scheduled previously for a caudal epidural however this was in the past she has derived significant relief with caudal epidurals. She desires to proceed with a repeat injection today to help with pain control. Otherwise she is taking her medications as prescribed with no diverting or illicit use and based on her narcotic assessment sheet these seem to be well tolerated with good relief noted. She reports that her overall lifestyle and function is better on the medications. We have reviewed her practitioner database information and it is appropriate. . Current Outpatient Prescriptions:  .  atorvastatin (LIPITOR) 20 MG tablet, , Disp: , Rfl:  .  Cholecalciferol (VITAMIN D3 PO), Take by mouth daily., Disp: , Rfl:  .  cyanocobalamin (V-R VITAMIN B-12) 500 MCG tablet, Take 500 mcg by mouth daily. , Disp: , Rfl:  .  dextromethorphan-guaiFENesin (MUCINEX DM) 30-600 MG 12hr tablet, Take 1 tablet by mouth 2 (two) times daily., Disp: , Rfl:  .  DULoxetine (CYMBALTA) 60 MG capsule, Take 60 mg by mouth every evening. Patient states she also takes a 30mg  in the morning per patient, Disp: , Rfl:  .  fluticasone (FLONASE) 50 MCG/ACT nasal spray, Place 2 sprays into both nostrils daily. , Disp: , Rfl:  .  furosemide (LASIX) 20 MG tablet, Take 20 mg by mouth 3 (three) times daily as needed for fluid., Disp: , Rfl:  .  HYDROcodone-acetaminophen  (NORCO/VICODIN) 5-325 MG tablet, Take 1 tablet by mouth every 6 (six) hours as needed for moderate pain or severe pain., Disp: 90 tablet, Rfl: 0 .  loratadine-pseudoephedrine (CLARITIN-D 12-HOUR) 5-120 MG per tablet, Take 1 tablet by mouth daily. , Disp: , Rfl:  .  methocarbamol (ROBAXIN) 750 MG tablet, , Disp: , Rfl:  .  metoprolol (LOPRESSOR) 50 MG tablet, Take 50 mg by mouth 2 (two) times daily. , Disp: , Rfl:  .  naproxen (NAPROSYN) 500 MG tablet, , Disp: , Rfl:  .  nystatin (MYCOSTATIN) 100000 UNIT/ML suspension, , Disp: , Rfl:  .  Olopatadine HCl (PATADAY) 0.2 % SOLN, Place 1 drop into both eyes daily as needed. For dry eyes, Disp: , Rfl:  .  potassium chloride (K-DUR) 10 MEQ tablet, , Disp: , Rfl:  .  tamsulosin (FLOMAX) 0.4 MG CAPS capsule, , Disp: , Rfl:  .  atorvastatin (LIPITOR) 40 MG tablet, Take 40 mg by mouth daily., Disp: , Rfl:  .  docusate sodium (COLACE) 100 MG capsule, Take 1 capsule (100 mg total) by mouth 2 (two) times daily. (Patient not taking: Reported on 09/22/2016), Disp: 60 capsule, Rfl: 0 .  DULoxetine (CYMBALTA) 30 MG capsule, Take 30 mg in am and 60 mg at night., Disp: , Rfl:  .  fluticasone (FLONASE) 50 MCG/ACT nasal spray, Place into the nose., Disp: , Rfl:  .  naproxen (  NAPROSYN) 500 MG tablet, Take by mouth., Disp: , Rfl:  .  potassium chloride SA (K-DUR,KLOR-CON) 20 MEQ tablet, Take 20 mEq by mouth daily., Disp: , Rfl:   Current Facility-Administered Medications:  .  iopamidol (ISOVUE-M) 41 % intrathecal injection 20 mL, 20 mL, Other, Once PRN, Madison EdwardsJames G Adams, MD, 20 mL at 09/22/16 1553 .  lactated ringers infusion 1,000 mL, 1,000 mL, Intravenous, Continuous, Madison EdwardsJames G Adams, MD  BP (!) 122/58   Pulse 72   Temp 97.8 F (36.6 C) (Temporal)   Resp 16   Ht 4\' 11"  (1.499 m)   Wt 200 lb (90.7 kg)   SpO2 98%   BMI 40.40 kg/m   Patient Active Problem List   Diagnosis Date Noted  . Spondylolisthesis of lumbar region 05/26/2016  . Facet arthritis of cervical  region (HCC) 03/05/2015  . DDD (degenerative disc disease), lumbar 03/05/2015    Allergies  Allergen Reactions  . Eggs Or Egg-Derived Products Anaphylaxis    Egg albumin  . Morphine And Related     unknown  . Spiriva Handihaler [Tiotropium Bromide Monohydrate] Other (See Comments)    bronchiospasms  . Sulfa Antibiotics Hives  . Penicillins Rash    Has patient had a PCN reaction causing immediate rash, facial/tongue/throat swelling, SOB or lightheadedness with hypotension: YES Has patient had a PCN reaction causing severe rash involving mucus membranes or skin necrosis: NO Has patient had a PCN reaction that required hospitalization NO Has patient had a PCN reaction occurring within the last 10 years: NO If all of the above answers are "NO", then may proceed with Cephalosporin use.  . Voltaren [Diclofenac] Rash      Physical exam: Patient is alert and oriented 3 and cooperative and compliant.  Heart is regular rate and rhythm without murmur  Inspection low back reveals some paraspinous muscle tenderness with a positive straight leg raise on the right side at about 30. Her strength appears to be at baseline  #1 bilateral  facet arthropathy  #2 degenerative disc disease now status post laminectomy with fusion with L5-S1 radiculitis right  #3 myofascial low back pain  #4 chronic opioid management  Plan:  #1 we will proceed with her first caudal epidural steroid injection today. The risks and benefits of an once again reviewed all questions answered no guarantees were made. Hopefully this will give her some good relief with the radicular symptoms in the right posterior lateral leg and hip. She is to return to clinic in 1-2 months for reevaluation and possible repeat injection.   #2 continue with back stretching strengthening exercises as tolerated and I have reiterated that this is important to help reduce spasming in the lower back.  #3. We will refill her medications at  this time and hopefully be able to wean these to a minimum in the near future as her back improves. We have reviewed the practitioner database information and it is appropriate.  Procedure: Caudal epidural steroid No. 1 After informed consent was obtained and the risks benefits reviewed patient chose to pursue a caudal epidural steroid injection today. With the patient in the prone position and an IV in place, sedation was with IV versed. Using AP and lateral fluoroscopic guidance I identified the area overlying the sacral hiatus. This area was broadly prepped with DuraPrep 3 and we utilized strict aseptic technique during the procedure. 1% lidocaine was infiltrated 2 cc using a 25-gauge needle overlying the sacral cornu and then using lateral fluoroscopic guidance I advanced  an 18-gauge Touhy needle approximately 2 cm through the sacral hiatus. Confirmation was with 2 cc of Isovue yielding good epidural spread and no evidence of IV or subarachnoid uptake. This was followed by an injection of 5 cc of saline mixed with 1 cc of ropivacaine 0.2% and 40 mg of triamcinolone. This was tolerated without difficulty the patient was convalesced and discharged home in stable condition for follow-up as mentioned. JA  Dr. Gwenyth Bender M.D.

## 2016-09-30 LAB — TOXASSURE SELECT 13 (MW), URINE

## 2016-10-15 ENCOUNTER — Other Ambulatory Visit: Payer: Self-pay | Admitting: Anesthesiology

## 2016-10-21 ENCOUNTER — Ambulatory Visit: Payer: BLUE CROSS/BLUE SHIELD | Admitting: Anesthesiology

## 2016-10-22 ENCOUNTER — Ambulatory Visit
Admission: RE | Admit: 2016-10-22 | Discharge: 2016-10-22 | Disposition: A | Discharge status: Home / Self Care | Payer: BLUE CROSS/BLUE SHIELD | Source: Ambulatory Visit | Attending: Anesthesiology | Admitting: Anesthesiology

## 2016-10-22 ENCOUNTER — Other Ambulatory Visit: Payer: Self-pay | Admitting: Anesthesiology

## 2016-10-22 ENCOUNTER — Encounter: Payer: Self-pay | Admitting: Anesthesiology

## 2016-10-22 ENCOUNTER — Ambulatory Visit (HOSPITAL_BASED_OUTPATIENT_CLINIC_OR_DEPARTMENT_OTHER): Payer: BLUE CROSS/BLUE SHIELD | Admitting: Anesthesiology

## 2016-10-22 VITALS — BP 121/58 | HR 69 | Temp 97.5°F | Resp 6 | Ht 59.0 in | Wt 198.0 lb

## 2016-10-22 DIAGNOSIS — M5441 Lumbago with sciatica, right side: Secondary | ICD-10-CM

## 2016-10-22 DIAGNOSIS — R52 Pain, unspecified: Secondary | ICD-10-CM

## 2016-10-22 DIAGNOSIS — M5431 Sciatica, right side: Secondary | ICD-10-CM

## 2016-10-22 DIAGNOSIS — M5386 Other specified dorsopathies, lumbar region: Secondary | ICD-10-CM

## 2016-10-22 DIAGNOSIS — M4316 Spondylolisthesis, lumbar region: Secondary | ICD-10-CM | POA: Insufficient documentation

## 2016-10-22 DIAGNOSIS — M5136 Other intervertebral disc degeneration, lumbar region: Secondary | ICD-10-CM | POA: Insufficient documentation

## 2016-10-22 DIAGNOSIS — M4692 Unspecified inflammatory spondylopathy, cervical region: Secondary | ICD-10-CM | POA: Diagnosis not present

## 2016-10-22 DIAGNOSIS — Z0001 Encounter for general adult medical examination with abnormal findings: Secondary | ICD-10-CM | POA: Insufficient documentation

## 2016-10-22 DIAGNOSIS — M545 Low back pain: Secondary | ICD-10-CM

## 2016-10-22 DIAGNOSIS — G8929 Other chronic pain: Secondary | ICD-10-CM

## 2016-10-22 DIAGNOSIS — Z880 Allergy status to penicillin: Secondary | ICD-10-CM | POA: Insufficient documentation

## 2016-10-22 MED ORDER — SODIUM CHLORIDE 0.9% FLUSH: 10.0000 mL | Freq: Once | INTRAVENOUS | Status: DC

## 2016-10-22 MED ORDER — LACTATED RINGERS IV SOLN: 1000.0000 mL | INTRAVENOUS | Status: DC

## 2016-10-22 MED ORDER — ROPIVACAINE HCL 2 MG/ML IJ SOLN
INTRAMUSCULAR | Status: AC
  Administered 2016-10-22: 15:00:00
  Filled 2016-10-22: qty 20

## 2016-10-22 MED ORDER — IOPAMIDOL (ISOVUE-M 200) INJECTION 41%
INTRAMUSCULAR | Status: AC
  Administered 2016-10-22: 15:00:00
  Filled 2016-10-22: qty 10

## 2016-10-22 MED ORDER — LIDOCAINE HCL (PF) 1 % IJ SOLN: 5.0000 mL | Freq: Once | INTRAMUSCULAR | Status: DC

## 2016-10-22 MED ORDER — ROPIVACAINE HCL 2 MG/ML IJ SOLN: 10.0000 mL | Freq: Once | INTRAMUSCULAR | Status: DC

## 2016-10-22 MED ORDER — HYDROCODONE-ACETAMINOPHEN 5-325 MG PO TABS: 1.0000 | ORAL_TABLET | Freq: Four times a day (QID) | ORAL | 0 refills | Status: DC | PRN

## 2016-10-22 MED ORDER — SODIUM CHLORIDE 0.9 % IJ SOLN
INTRAMUSCULAR | Status: AC
  Administered 2016-10-22: 15:00:00
  Filled 2016-10-22: qty 10

## 2016-10-22 MED ORDER — MIDAZOLAM HCL 5 MG/5ML IJ SOLN
INTRAMUSCULAR | Status: AC
  Administered 2016-10-22: 1 mg via INTRAVENOUS
  Filled 2016-10-22: qty 5

## 2016-10-22 MED ORDER — METHOCARBAMOL 750 MG PO TABS: 750.0000 mg | ORAL_TABLET | Freq: Three times a day (TID) | ORAL | 5 refills | Status: DC

## 2016-10-22 MED ORDER — TRIAMCINOLONE ACETONIDE 40 MG/ML IJ SUSP: 40.0000 mg | Freq: Once | INTRAMUSCULAR | Status: DC

## 2016-10-22 MED ORDER — TRIAMCINOLONE ACETONIDE 40 MG/ML IJ SUSP
INTRAMUSCULAR | Status: AC
  Administered 2016-10-22: 15:00:00
  Filled 2016-10-22: qty 1

## 2016-10-22 MED ORDER — LIDOCAINE HCL (PF) 1 % IJ SOLN
INTRAMUSCULAR | Status: AC
  Administered 2016-10-22: 15:00:00
  Filled 2016-10-22: qty 5

## 2016-10-22 MED ORDER — MIDAZOLAM HCL 2 MG/2ML IJ SOLN: 5.0000 mg | Freq: Once | INTRAMUSCULAR | Status: DC

## 2016-10-22 NOTE — Patient Instructions (Signed)

## 2016-10-22 NOTE — Progress Notes (Signed)
Safety precautions to be maintained throughout the outpatient stay will include: orient to surroundings, keep bed in low position, maintain call bell within reach at all times, provide assistance with transfer out of bed and ambulation.  

## 2016-10-22 NOTE — Progress Notes (Signed)
Chief complaint: Severe low back pain with radiation into the right hip and buttock region.  Procedure: Caudal epidural steroid No. 2under fluoroscopic guidance with moderate sedation  Previous Procedure: Bilateral lumbar medial branch block to the facet at L3-4 L4-5 L5-S1 and S1 under fluoroscopic guidance with moderate sedation                                    Right Side lumbar facet medial branch block at L2-3 and L3-4 L4-5 L5-S1 and S1 under fluoroscopic guidance with moderate sedation  History of present illness:Donielle Rutha Bouchard presents for reevaluation. She was last seen in January and which point she had her first caudal epidural steroid. She did well with that with a 60% reduction in her low back pain and a significant improvement in the right lower leg pain. He still experiencing some pain radiating into the right posterior upper leg but of a diminished severity. She is also tolerating her medications well with no problems noted. Her bowel and bladder function has been stable and she states that she is otherwise in her usual state of health. Her strength is at baseline. . Current Outpatient Prescriptions:  .  atorvastatin (LIPITOR) 40 MG tablet, Take 40 mg by mouth daily., Disp: , Rfl:  .  Cholecalciferol (VITAMIN D3 PO), Take by mouth daily., Disp: , Rfl:  .  cyanocobalamin (V-R VITAMIN B-12) 500 MCG tablet, Take 500 mcg by mouth daily. , Disp: , Rfl:  .  dextromethorphan-guaiFENesin (MUCINEX DM) 30-600 MG 12hr tablet, Take 1 tablet by mouth 2 (two) times daily., Disp: , Rfl:  .  DULoxetine (CYMBALTA) 60 MG capsule, Take 60 mg by mouth every evening. Patient states she also takes a 30mg  in the morning per patient, Disp: , Rfl:  .  fluticasone (FLONASE) 50 MCG/ACT nasal spray, Place 2 sprays into both nostrils daily. , Disp: , Rfl:  .  furosemide (LASIX) 20 MG tablet, Take 20 mg by mouth 3 (three) times daily as needed for fluid., Disp: , Rfl:  .  HYDROcodone-acetaminophen  (NORCO/VICODIN) 5-325 MG tablet, Take 1 tablet by mouth every 6 (six) hours as needed for moderate pain or severe pain., Disp: 90 tablet, Rfl: 0 .  loratadine-pseudoephedrine (CLARITIN-D 12-HOUR) 5-120 MG per tablet, Take 1 tablet by mouth daily. , Disp: , Rfl:  .  methocarbamol (ROBAXIN) 750 MG tablet, Take 1 tablet (750 mg total) by mouth 3 (three) times daily., Disp: 90 tablet, Rfl: 5 .  metoprolol (LOPRESSOR) 50 MG tablet, Take 50 mg by mouth 2 (two) times daily. , Disp: , Rfl:  .  naproxen (NAPROSYN) 500 MG tablet, Take by mouth., Disp: , Rfl:  .  Olopatadine HCl (PATADAY) 0.2 % SOLN, Place 1 drop into both eyes daily as needed. For dry eyes, Disp: , Rfl:  .  potassium chloride SA (K-DUR,KLOR-CON) 20 MEQ tablet, Take 20 mEq by mouth daily., Disp: , Rfl:  .  tamsulosin (FLOMAX) 0.4 MG CAPS capsule, , Disp: , Rfl:  .  atorvastatin (LIPITOR) 20 MG tablet, , Disp: , Rfl:  .  docusate sodium (COLACE) 100 MG capsule, Take 1 capsule (100 mg total) by mouth 2 (two) times daily. (Patient not taking: Reported on 09/22/2016), Disp: 60 capsule, Rfl: 0 .  DULoxetine (CYMBALTA) 30 MG capsule, Take 30 mg in am and 60 mg at night., Disp: , Rfl:  .  fluticasone (FLONASE) 50 MCG/ACT nasal spray, Place  into the nose., Disp: , Rfl:  .  naproxen (NAPROSYN) 500 MG tablet, , Disp: , Rfl:  .  nystatin (MYCOSTATIN) 100000 UNIT/ML suspension, , Disp: , Rfl:  .  potassium chloride (K-DUR) 10 MEQ tablet, , Disp: , Rfl:   Current Facility-Administered Medications:  .  iopamidol (ISOVUE-M) 41 % intrathecal injection 20 mL, 20 mL, Other, Once PRN, Yevette Edwards, MD, 20 mL at 09/22/16 1553 .  lactated ringers infusion 1,000 mL, 1,000 mL, Intravenous, Continuous, Yevette Edwards, MD .  lactated ringers infusion 1,000 mL, 1,000 mL, Intravenous, Continuous, Yevette Edwards, MD .  lidocaine (PF) (XYLOCAINE) 1 % injection 5 mL, 5 mL, Subcutaneous, Once, Yevette Edwards, MD .  midazolam (VERSED) injection 5 mg, 5 mg, Intravenous,  Once, Yevette Edwards, MD .  ropivacaine (PF) 2 mg/mL (0.2%) (NAROPIN) injection 10 mL, 10 mL, Epidural, Once, Yevette Edwards, MD .  sodium chloride flush (NS) 0.9 % injection 10 mL, 10 mL, Other, Once, Yevette Edwards, MD .  triamcinolone acetonide (KENALOG-40) injection 40 mg, 40 mg, Other, Once, Yevette Edwards, MD  BP 135/74   Pulse 77   Temp 98.3 F (36.8 C) (Oral)   Resp 20   Ht 4\' 11"  (1.499 m)   Wt 198 lb (89.8 kg)   SpO2 97%   BMI 39.99 kg/m   Patient Active Problem List   Diagnosis Date Noted  . Spondylolisthesis of lumbar region 05/26/2016  . Facet arthritis of cervical region (HCC) 03/05/2015  . DDD (degenerative disc disease), lumbar 03/05/2015    Allergies  Allergen Reactions  . Eggs Or Egg-Derived Products Anaphylaxis    Egg albumin  . Morphine And Related     unknown  . Spiriva Handihaler [Tiotropium Bromide Monohydrate] Other (See Comments)    bronchiospasms  . Sulfa Antibiotics Hives  . Penicillins Rash    Has patient had a PCN reaction causing immediate rash, facial/tongue/throat swelling, SOB or lightheadedness with hypotension: YES Has patient had a PCN reaction causing severe rash involving mucus membranes or skin necrosis: NO Has patient had a PCN reaction that required hospitalization NO Has patient had a PCN reaction occurring within the last 10 years: NO If all of the above answers are "NO", then may proceed with Cephalosporin use.  . Voltaren [Diclofenac] Rash   Review of systems: GI: No significant constipation  Cardiac: No angina or shortness of breath   Physical exam: Patient is alert and oriented 3 and cooperative and compliant.  Heart is regular rate and rhythm without murmur  . Her strength appears to be at baseline Assessment: #1 bilateral  facet arthropathy  #2 degenerative disc disease now status post laminectomy with fusion with L5-S1 radiculitis right  #3 myofascial low back pain  #4 chronic opioid management  Plan:  #1 has  done well with the caudal epidural steroid injections with diminished radiculitis on the right side. We will proceed with a second caudal today. The risks and benefits are once again reviewed. She is to return to clinic in 1 month for reevaluation however if she is doing well we will span this out to 2 months  #2 continue with back stretching strengthening exercises as tolerated and I have reiterated that this is important to help reduce spasming in the lower back.  #3. We will refill her medications at this time and hopefully be able to wean these to a minimum in the near future as her back improves. We have reviewed the practitioner  database information and it is appropriate. This next prescription should get her through the next 2 months.  Procedure: Caudal epidural steroid No. 2 After informed consent was obtained and the risks benefits reviewed patient chose to pursue a caudal epidural steroid injection today. With the patient in the prone position and an IV in place, sedation was with IV versed 1 mg. Using AP and lateral fluoroscopic guidance I identified the area overlying the sacral hiatus. This area was broadly prepped with DuraPrep 3 and we utilized strict aseptic technique during the procedure. 1% lidocaine was infiltrated 2 cc using a 25-gauge needle overlying the sacral cornu and then using lateral fluoroscopic guidance I advanced an 18-gauge Touhy needle approximately 2 cm through the sacral hiatus. Confirmation was with 2 cc of Isovue yielding good epidural spread and no evidence of IV or subarachnoid uptake. This was followed by an injection of 5 cc of saline mixed with 1 cc of ropivacaine 0.2% and 40 mg of triamcinolone. This was tolerated without difficulty the patient was convalesced and discharged home in stable condition for follow-up as mentioned. JA  Dr. Gwenyth BenderJames Andrej Spagnoli M.D.

## 2016-10-23 ENCOUNTER — Telehealth: Payer: Self-pay | Admitting: *Deleted

## 2016-10-23 NOTE — Telephone Encounter (Signed)
No problems post procedure. 

## 2016-11-11 ENCOUNTER — Ambulatory Visit: Payer: BLUE CROSS/BLUE SHIELD | Admitting: Anesthesiology

## 2016-12-24 ENCOUNTER — Other Ambulatory Visit: Payer: Self-pay | Admitting: Anesthesiology

## 2016-12-24 ENCOUNTER — Encounter: Payer: Self-pay | Admitting: Anesthesiology

## 2016-12-24 ENCOUNTER — Ambulatory Visit
Admission: RE | Admit: 2016-12-24 | Discharge: 2016-12-24 | Disposition: A | Discharge status: Home / Self Care | Payer: Medicare Other | Source: Ambulatory Visit | Attending: Anesthesiology | Admitting: Anesthesiology

## 2016-12-24 ENCOUNTER — Ambulatory Visit (HOSPITAL_BASED_OUTPATIENT_CLINIC_OR_DEPARTMENT_OTHER): Payer: Self-pay | Admitting: Anesthesiology

## 2016-12-24 VITALS — BP 112/80 | HR 95 | Temp 98.0°F | Resp 16 | Ht 59.0 in | Wt 200.0 lb

## 2016-12-24 DIAGNOSIS — M5431 Sciatica, right side: Secondary | ICD-10-CM

## 2016-12-24 DIAGNOSIS — M5136 Other intervertebral disc degeneration, lumbar region: Secondary | ICD-10-CM

## 2016-12-24 DIAGNOSIS — M5386 Other specified dorsopathies, lumbar region: Secondary | ICD-10-CM | POA: Insufficient documentation

## 2016-12-24 DIAGNOSIS — R52 Pain, unspecified: Secondary | ICD-10-CM | POA: Insufficient documentation

## 2016-12-24 MED ORDER — MIDAZOLAM HCL 5 MG/5ML IJ SOLN: 5.0000 mg | Freq: Once | INTRAMUSCULAR | Status: DC

## 2016-12-24 MED ORDER — SODIUM CHLORIDE 0.9 % IJ SOLN
INTRAMUSCULAR | Status: AC
  Filled 2016-12-24: qty 20

## 2016-12-24 MED ORDER — DEXAMETHASONE SODIUM PHOSPHATE 4 MG/ML IJ SOLN
4.0000 mg | Freq: Once | INTRAMUSCULAR | Status: AC
  Administered 2016-12-24: 4 mg
  Filled 2016-12-24: qty 1

## 2016-12-24 MED ORDER — TRIAMCINOLONE ACETONIDE 40 MG/ML IJ SUSP
40.0000 mg | Freq: Once | INTRAMUSCULAR | Status: DC
  Filled 2016-12-24: qty 1

## 2016-12-24 MED ORDER — SODIUM CHLORIDE 0.9% FLUSH: 10.0000 mL | Freq: Once | INTRAVENOUS | Status: DC

## 2016-12-24 MED ORDER — IOPAMIDOL (ISOVUE-M 200) INJECTION 41%
20.0000 mL | Freq: Once | INTRAMUSCULAR | Status: DC | PRN
  Administered 2016-12-24: 10 mL
  Filled 2016-12-24: qty 20

## 2016-12-24 MED ORDER — LACTATED RINGERS IV SOLN: 1000.0000 mL | INTRAVENOUS | Status: DC

## 2016-12-24 MED ORDER — LIDOCAINE HCL (PF) 1 % IJ SOLN
5.0000 mL | Freq: Once | INTRAMUSCULAR | Status: AC
  Administered 2016-12-24: 5 mL via SUBCUTANEOUS
  Filled 2016-12-24: qty 5

## 2016-12-24 MED ORDER — HYDROCODONE-ACETAMINOPHEN 5-325 MG PO TABS: 1.0000 | ORAL_TABLET | Freq: Four times a day (QID) | ORAL | 0 refills | Status: DC | PRN

## 2016-12-24 MED ORDER — IOPAMIDOL (ISOVUE-M 200) INJECTION 41%
INTRAMUSCULAR | Status: AC
  Filled 2016-12-24: qty 10

## 2016-12-24 MED ORDER — ROPIVACAINE HCL 2 MG/ML IJ SOLN
10.0000 mL | Freq: Once | INTRAMUSCULAR | Status: DC
  Filled 2016-12-24: qty 10

## 2016-12-24 MED ORDER — LIDOCAINE HCL (PF) 1 % IJ SOLN
INTRAMUSCULAR | Status: AC
  Filled 2016-12-24: qty 5

## 2016-12-24 NOTE — Patient Instructions (Signed)
Pain Management Discharge Instructions  General Discharge Instructions :  If you need to reach your doctor call: Monday-Friday 8:00 am - 4:00 pm at [phone] or toll free [phone].  After clinic hours [phone] to have operator reach doctor.  Bring all of your medication bottles to all your appointments in the pain clinic.  To cancel or reschedule your appointment with Pain Management please remember to call 24 hours in advance to avoid a fee.  Refer to the educational materials which you have been given on: General Risks, I had my Procedure. Discharge Instructions, Post Sedation.  Post Procedure Instructions:  The drugs you were given will stay in your system until tomorrow, so for the next 24 hours you should not drive, make any legal decisions or drink any alcoholic beverages.  You may eat anything you prefer, but it is better to start with liquids then soups and crackers, and gradually work up to solid foods.  Please notify your doctor immediately if you have any unusual bleeding, trouble breathing or pain that is not related to your normal pain.  Depending on the type of procedure that was done, some parts of your body may feel week and/or numb.  This usually clears up by tonight or the next day.  Walk with the use of an assistive device or accompanied by an adult for the 24 hours.  You may use ice on the affected area for the first 24 hours.  Put ice in a Ziploc bag and cover with a towel and place against area 15 minutes on 15 minutes off.  You may switch to heat after 24 hours.GENERAL RISKS AND COMPLICATIONS  What are the risk, side effects and possible complications? Generally speaking, most procedures are safe.  However, with any procedure there are risks, side effects, and the possibility of complications.  The risks and complications are dependent upon the sites that are lesioned, or the type of nerve block to be performed.  The closer the procedure is to the spine,  the more serious the risks are.  Great care is taken when placing the radio frequency needles, block needles or lesioning probes, but sometimes complications can occur. 1. Infection: Any time there is an injection through the skin, there is a risk of infection.  This is why sterile conditions are used for these blocks.  There are four possible types of infection. 1. Localized skin infection. 2. Central Nervous System Infection-This can be in the form of Meningitis, which can be deadly. 3. Epidural Infections-This can be in the form of an epidural abscess, which can cause pressure inside of the spine, causing compression of the spinal cord with subsequent paralysis. This would require an emergency surgery to decompress, and there are no guarantees that the patient would recover from the paralysis. 4. Discitis-This is an infection of the intervertebral discs.  It occurs in about 1% of discography procedures.  It is difficult to treat and it may lead to surgery.        2. Pain: the needles have to go through skin and soft tissues, will cause soreness.       3. Damage to internal structures:  The nerves to be lesioned may be near blood vessels or    other nerves which can be potentially damaged.       4. Bleeding: Bleeding is more common if the patient is taking blood thinners such as  aspirin, Coumadin, Ticiid, Plavix, etc., or if he/she have some genetic predisposition  such as   hemophilia. Bleeding into the spinal canal can cause compression of the spinal  cord with subsequent paralysis.  This would require an emergency surgery to  decompress and there are no guarantees that the patient would recover from the  paralysis.       5. Pneumothorax:  Puncturing of a lung is a possibility, every time a needle is introduced in  the area of the chest or upper back.  Pneumothorax refers to free air around the  collapsed lung(s), inside of the thoracic cavity (chest cavity).  Another two possible  complications  related to a similar event would include: Hemothorax and Chylothorax.   These are variations of the Pneumothorax, where instead of air around the collapsed  lung(s), you may have blood or chyle, respectively.       6. Spinal headaches: They may occur with any procedures in the area of the spine.       7. Persistent CSF (Cerebro-Spinal Fluid) leakage: This is a rare problem, but may occur  with prolonged intrathecal or epidural catheters either due to the formation of a fistulous  track or a dural tear.       8. Nerve damage: By working so close to the spinal cord, there is always a possibility of  nerve damage, which could be as serious as a permanent spinal cord injury with  paralysis.       9. Death:  Although rare, severe deadly allergic reactions known as "Anaphylactic  reaction" can occur to any of the medications used.      10. Worsening of the symptoms:  We can always make thing worse.  What are the chances of something like this happening? Chances of any of this occuring are extremely low.  By statistics, you have more of a chance of getting killed in a motor vehicle accident: while driving to the hospital than any of the above occurring .  Nevertheless, you should be aware that they are possibilities.  In general, it is similar to taking a shower.  Everybody knows that you can slip, hit your head and get killed.  Does that mean that you should not shower again?  Nevertheless always keep in mind that statistics do not mean anything if you happen to be on the wrong side of them.  Even if a procedure has a 1 (one) in a 1,000,000 (million) chance of going wrong, it you happen to be that one..Also, keep in mind that by statistics, you have more of a chance of having something go wrong when taking medications.  Who should not have this procedure? If you are on a blood thinning medication (e.g. Coumadin, Plavix, see list of "Blood Thinners"), or if you have an active infection going on, you should not  have the procedure.  If you are taking any blood thinners, please inform your physician.  How should I prepare for this procedure?  Do not eat or drink anything at least six hours prior to the procedure.  Bring a driver with you .  It cannot be a taxi.  Come accompanied by an adult that can drive you back, and that is strong enough to help you if your legs get weak or numb from the local anesthetic.  Take all of your medicines the morning of the procedure with just enough water to swallow them.  If you have diabetes, make sure that you are scheduled to have your procedure done first thing in the morning, whenever possible.  If you have diabetes,   take only half of your insulin dose and notify our nurse that you have done so as soon as you arrive at the clinic.  If you are diabetic, but only take blood sugar pills (oral hypoglycemic), then do not take them on the morning of your procedure.  You may take them after you have had the procedure.  Do not take aspirin or any aspirin-containing medications, at least eleven (11) days prior to the procedure.  They may prolong bleeding.  Wear loose fitting clothing that may be easy to take off and that you would not mind if it got stained with Betadine or blood.  Do not wear any jewelry or perfume  Remove any nail coloring.  It will interfere with some of our monitoring equipment.  NOTE: Remember that this is not meant to be interpreted as a complete list of all possible complications.  Unforeseen problems may occur.  BLOOD THINNERS The following drugs contain aspirin or other products, which can cause increased bleeding during surgery and should not be taken for 2 weeks prior to and 1 week after surgery.  If you should need take something for relief of minor pain, you may take acetaminophen which is found in Tylenol,m Datril, Anacin-3 and Panadol. It is not blood thinner. The products listed below are.  Do not take any of the products listed below  in addition to any listed on your instruction sheet.  A.P.C or A.P.C with Codeine Codeine Phosphate Capsules #3 Ibuprofen Ridaura  ABC compound Congesprin Imuran rimadil  Advil Cope Indocin Robaxisal  Alka-Seltzer Effervescent Pain Reliever and Antacid Coricidin or Coricidin-D  Indomethacin Rufen  Alka-Seltzer plus Cold Medicine Cosprin Ketoprofen S-A-C Tablets  Anacin Analgesic Tablets or Capsules Coumadin Korlgesic Salflex  Anacin Extra Strength Analgesic tablets or capsules CP-2 Tablets Lanoril Salicylate  Anaprox Cuprimine Capsules Levenox Salocol  Anexsia-D Dalteparin Magan Salsalate  Anodynos Darvon compound Magnesium Salicylate Sine-off  Ansaid Dasin Capsules Magsal Sodium Salicylate  Anturane Depen Capsules Marnal Soma  APF Arthritis pain formula Dewitt's Pills Measurin Stanback  Argesic Dia-Gesic Meclofenamic Sulfinpyrazone  Arthritis Bayer Timed Release Aspirin Diclofenac Meclomen Sulindac  Arthritis pain formula Anacin Dicumarol Medipren Supac  Analgesic (Safety coated) Arthralgen Diffunasal Mefanamic Suprofen  Arthritis Strength Bufferin Dihydrocodeine Mepro Compound Suprol  Arthropan liquid Dopirydamole Methcarbomol with Aspirin Synalgos  ASA tablets/Enseals Disalcid Micrainin Tagament  Ascriptin Doan's Midol Talwin  Ascriptin A/D Dolene Mobidin Tanderil  Ascriptin Extra Strength Dolobid Moblgesic Ticlid  Ascriptin with Codeine Doloprin or Doloprin with Codeine Momentum Tolectin  Asperbuf Duoprin Mono-gesic Trendar  Aspergum Duradyne Motrin or Motrin IB Triminicin  Aspirin plain, buffered or enteric coated Durasal Myochrisine Trigesic  Aspirin Suppositories Easprin Nalfon Trillsate  Aspirin with Codeine Ecotrin Regular or Extra Strength Naprosyn Uracel  Atromid-S Efficin Naproxen Ursinus  Auranofin Capsules Elmiron Neocylate Vanquish  Axotal Emagrin Norgesic Verin  Azathioprine Empirin or Empirin with Codeine Normiflo Vitamin E  Azolid Emprazil Nuprin Voltaren  Bayer  Aspirin plain, buffered or children's or timed BC Tablets or powders Encaprin Orgaran Warfarin Sodium  Buff-a-Comp Enoxaparin Orudis Zorpin  Buff-a-Comp with Codeine Equegesic Os-Cal-Gesic   Buffaprin Excedrin plain, buffered or Extra Strength Oxalid   Bufferin Arthritis Strength Feldene Oxphenbutazone   Bufferin plain or Extra Strength Feldene Capsules Oxycodone with Aspirin   Bufferin with Codeine Fenoprofen Fenoprofen Pabalate or Pabalate-SF   Buffets II Flogesic Panagesic   Buffinol plain or Extra Strength Florinal or Florinal with Codeine Panwarfarin   Buf-Tabs Flurbiprofen Penicillamine   Butalbital Compound Four-way cold tablets   Penicillin   Butazolidin Fragmin Pepto-Bismol   Carbenicillin Geminisyn Percodan   Carna Arthritis Reliever Geopen Persantine   Carprofen Gold's salt Persistin   Chloramphenicol Goody's Phenylbutazone   Chloromycetin Haltrain Piroxlcam   Clmetidine heparin Plaquenil   Cllnoril Hyco-pap Ponstel   Clofibrate Hydroxy chloroquine Propoxyphen         Before stopping any of these medications, be sure to consult the physician who ordered them.  Some, such as Coumadin (Warfarin) are ordered to prevent or treat serious conditions such as "deep thrombosis", "pumonary embolisms", and other heart problems.  The amount of time that you may need off of the medication may also vary with the medication and the reason for which you were taking it.  If you are taking any of these medications, please make sure you notify your pain physician before you undergo any procedures.         Epidural Steroid Injection An epidural steroid injection is a shot of steroid medicine and numbing medicine that is given into the space between the spinal cord and the bones in your back (epidural space). The shot helps relieve pain caused by an irritated or swollen nerve root. The amount of pain relief you get from the injection depends on what is causing the nerve to be swollen and  irritated, and how long your pain lasts. You are more likely to benefit from this injection if your pain is strong and comes on suddenly rather than if you have had pain for a long time. Tell a health care provider about:  Any allergies you have.  All medicines you are taking, including vitamins, herbs, eye drops, creams, and over-the-counter medicines.  Any problems you or family members have had with anesthetic medicines.  Any blood disorders you have.  Any surgeries you have had.  Any medical conditions you have.  Whether you are pregnant or may be pregnant. What are the risks? Generally, this is a safe procedure. However, problems may occur, including:  Headache.  Bleeding.  Infection.  Allergic reaction to medicines.  Damage to your nerves.  What happens before the procedure? Staying hydrated Follow instructions from your health care provider about hydration, which may include:  Up to 2 hours before the procedure - you may continue to drink clear liquids, such as water, clear fruit juice, black coffee, and plain tea.  Eating and drinking restrictions Follow instructions from your health care provider about eating and drinking, which may include:  8 hours before the procedure - stop eating heavy meals or foods such as meat, fried foods, or fatty foods.  6 hours before the procedure - stop eating light meals or foods, such as toast or cereal.  6 hours before the procedure - stop drinking milk or drinks that contain milk.  2 hours before the procedure - stop drinking clear liquids.  Medicine  You may be given medicines to lower anxiety.  Ask your health care provider about: ? Changing or stopping your regular medicines. This is especially important if you are taking diabetes medicines or blood thinners. ? Taking medicines such as aspirin and ibuprofen. These medicines can thin your blood. Do not take these medicines before your procedure if your health care  provider instructs you not to. General instructions  Plan to have someone take you home from the hospital or clinic. What happens during the procedure?  You may receive a medicine to help you relax (sedative).  You will be asked to lie on your abdomen.  The   injection site will be cleaned.  A numbing medicine (local anesthetic) will be used to numb the injection site.  A needle will be inserted through your skin into the epidural space. You may feel some discomfort when this happens. An X-ray machine will be used to make sure the needle is put as close as possible to the affected nerve.  A steroid medicine and a local anesthetic will be injected into the epidural space.  The needle will be removed.  A bandage (dressing) will be put over the injection site. What happens after the procedure?  Your blood pressure, heart rate, breathing rate, and blood oxygen level will be monitored until the medicines you were given have worn off.  Your arm or leg may feel weak or numb for a few hours.  The injection site may feel sore.  Do not drive for 24 hours if you received a sedative. This information is not intended to replace advice given to you by your health care provider. Make sure you discuss any questions you have with your health care provider. Document Released: 12/02/2007 Document Revised: 02/06/2016 Document Reviewed: 12/11/2015 [employer] Interactive Patient Education  2017 [employer]  

## 2016-12-24 NOTE — Progress Notes (Signed)
Chief complaint: Severe low back pain with radiation into the right hip and buttock region.  Procedure: Caudal epidural steroid No. 3 under fluoroscopic guidance and moderate rotation and right gluteal trigger point injection 1  Previous Procedure: Caudal epidural steroid No. 2under fluoroscopic guidance with moderate sedation  Previous Procedure: Bilateral lumbar medial branch block to the facet at L3-4 L4-5 L5-S1 and S1 under fluoroscopic guidance with moderate sedation                                    Right Side lumbar facet medial branch block at L2-3 and L3-4 L4-5 L5-S1 and S1 under fluoroscopic guidance with moderate sedation  History of present illness:Madison [name] presents for reevaluation. She was last seen a few months ago and did very well with her caudal epidural steroid injection. She reported an 80% improvement in her low back pain lasting approximately 6-8 weeks and a similar reduction in her right leg pain. Unfortunately her daughter recently had heart surgery and wall moving about in the ICU after surgery Madison [name] try to help and injured her back reaching for something. This caused an exacerbation of her pain and it is mainly in the right lower back radiating into the right gluteal region. No changes in strength or lower extremity function or bowel or bladder function are noted. She is still taking her medications as prescribed and doing well with these based on her narcotic assessment sheet and she is working on her stretching exercises. She desires to proceed with her third caudal today.  . Current Outpatient Prescriptions:  .  atorvastatin (LIPITOR) 40 MG tablet, Take 40 mg by mouth daily., Disp: , Rfl:  .  Cholecalciferol (VITAMIN D3 PO), Take by mouth daily., Disp: , Rfl:  .  cyanocobalamin (V-R VITAMIN B-12) 500 MCG tablet, Take 500 mcg by mouth daily. , Disp: , Rfl:  .  dextromethorphan-guaiFENesin (MUCINEX DM) 30-600 MG 12hr tablet, Take 1 tablet by mouth 2 (two) times  daily., Disp: , Rfl:  .  docusate sodium (COLACE) 100 MG capsule, Take 1 capsule (100 mg total) by mouth 2 (two) times daily., Disp: 60 capsule, Rfl: 0 .  DULoxetine (CYMBALTA) 60 MG capsule, Take 60 mg by mouth every evening. Patient states she also takes a  in the morning per patient, Disp: , Rfl:  .  furosemide (LASIX) 20 MG tablet, Take 20 mg by mouth 3 (three) times daily as needed for fluid., Disp: , Rfl:  .  HYDROcodone-acetaminophen (NORCO/VICODIN) 5-325 MG tablet, Take 1 tablet by mouth every 6 (six) hours as needed for moderate pain or severe pain., Disp: 90 tablet, Rfl: 0 .  loratadine-pseudoephedrine (CLARITIN-D 12-HOUR) 5-120 MG per tablet, Take 1 tablet by mouth daily. , Disp: , Rfl:  .  methocarbamol (ROBAXIN) 750 MG tablet, Take 1 tablet (750 mg total) by mouth 3 (three) times daily., Disp: 90 tablet, Rfl: 5 .  metoprolol (LOPRESSOR) 50 MG tablet, Take 50 mg by mouth 2 (two) times daily. , Disp: , Rfl:  .  naproxen (NAPROSYN) 500 MG tablet, , Disp: , Rfl:  .  Olopatadine HCl (PATADAY) 0.2 % SOLN, Place 1 drop into both eyes daily as needed. For dry eyes, Disp: , Rfl:  .  potassium chloride (K-DUR) 10 MEQ tablet, , Disp: , Rfl:  .  tamsulosin (FLOMAX) 0.4 MG CAPS capsule, , Disp: , Rfl:  .  fluticasone (FLONASE) 50 MCG/ACT nasal  spray, Place into the nose., Disp: , Rfl:   Current Facility-Administered Medications:  .  iopamidol (ISOVUE-M) 41 % intrathecal injection 20 mL, 20 mL, Other, Once PRN, Yevette Edwards, MD, 20 mL at 09/22/16 1553 .  iopamidol (ISOVUE-M) 41 % intrathecal injection 20 mL, 20 mL, Other, Once PRN, Yevette Edwards, MD, 10 mL at 12/24/16 1416 .  lactated ringers infusion 1,000 mL, 1,000 mL, Intravenous, Continuous, Yevette Edwards, MD .  lactated ringers infusion 1,000 mL, 1,000 mL, Intravenous, Continuous, Yevette Edwards, MD .  lactated ringers infusion 1,000 mL, 1,000 mL, Intravenous, Continuous, Yevette Edwards, MD .  lidocaine (PF) (XYLOCAINE) 1 % injection 5 mL,  5 mL, Subcutaneous, Once, Yevette Edwards, MD .  midazolam (VERSED) 5 MG/5ML injection 5 mg, 5 mg, Intravenous, Once, Yevette Edwards, MD .  midazolam (VERSED) injection 5 mg, 5 mg, Intravenous, Once, Yevette Edwards, MD .  ropivacaine (PF) 2 mg/mL (0.2%) (NAROPIN) injection 10 mL, 10 mL, Epidural, Once, Yevette Edwards, MD .  ropivacaine (PF) 2 mg/mL (0.2%) (NAROPIN) injection 10 mL, 10 mL, Epidural, Once, Yevette Edwards, MD .  sodium chloride flush (NS) 0.9 % injection 10 mL, 10 mL, Other, Once, Yevette Edwards, MD .  sodium chloride flush (NS) 0.9 % injection 10 mL, 10 mL, Other, Once, Yevette Edwards, MD .  triamcinolone acetonide (KENALOG-40) injection 40 mg, 40 mg, Other, Once, Yevette Edwards, MD .  triamcinolone acetonide (KENALOG-40) injection 40 mg, 40 mg, Other, Once, Yevette Edwards, MD  BP 127/75   Pulse 86   Temp 98 F (36.7 C) (Oral)   Resp 18   Ht  (1.499 m)   Wt 200 lb (90.7 kg)   SpO2 95%   BMI 40.40 kg/m   Patient Active Problem List   Diagnosis Date Noted  . Spondylolisthesis of lumbar region 05/26/2016  . Facet arthritis of cervical region (HCC) 03/05/2015  . DDD (degenerative disc disease), lumbar 03/05/2015    Allergies  Allergen Reactions  . Eggs Or Egg-Derived Products Anaphylaxis    Egg albumin  . Morphine And Related     unknown  . Spiriva Handihaler [Tiotropium Bromide Monohydrate] Other (See Comments)    bronchiospasms  . Sulfa Antibiotics Hives  . Penicillins Rash    Has patient had a PCN reaction causing immediate rash, facial/tongue/throat swelling, SOB or lightheadedness with hypotension: YES Has patient had a PCN reaction causing severe rash involving mucus membranes or skin necrosis: NO Has patient had a PCN reaction that required hospitalization NO Has patient had a PCN reaction occurring within the last 10 years: NO If all of the above answers are "NO", then may proceed with Cephalosporin use.  . Voltaren [Diclofenac] Rash   Review of systems:  GI: No significant constipation  Cardiac: No angina or shortness of breath GI: No constipation   Physical exam: Patient is alert and oriented 3 and cooperative and compliant.  Heart is regular rate and rhythm without murmur  . Her strength appears to be at baseline she has good muscle tone and bulk and a trigger point in the right mid gluteal region is noted. The scar in the midline is well-healed. Assessment: #1 bilateral  facet arthropathy  #2 degenerative disc disease now status post laminectomy with fusion with L5-S1 radiculitis right that responds favorably to epidural steroids.  #3 myofascial low back pain  #4 chronic opioid management  Plan:  #1 we will proceed with her third  epidural today. The risks and benefits are once again reviewed all questions answered and no guarantees made. 1 her to continue with her back stretching strengthening exercises and aerobic condition and walking. Return to clinic in 2 months for reevaluation and review.  #2 continue with back stretching strengthening exercises as tolerated and I have reiterated that this is important to help reduce spasming in the lower back.  #3. We will refill her medications at this time and hopefully be able to wean these to a minimum in the near future as her back improves. We have reviewed the practitioner database information and it is appropriate. She has prescription  To get her through the next 2 months.  Procedure: Caudal epidural steroid No. 3 and right gluteal trigger point injection After informed consent was obtained and the risks benefits reviewed patient chose to pursue a caudal epidural steroid injection today. With the patient in the prone position and without IV sedation and Using AP and lateral fluoroscopic guidance I identified the area overlying the sacral hiatus. This area was broadly prepped with DuraPrep 3 and we utilized strict aseptic technique during the procedure. 1% lidocaine was infiltrated 2  cc using a 25-gauge needle overlying the sacral cornu and then using lateral fluoroscopic guidance I advanced an 18-gauge Touhy needle approximately 2 cm through the sacral hiatus. Confirmation was with 2 cc of Isovue yielding good epidural spread and no evidence of IV or subarachnoid uptake. This was followed by an injection of 5 cc of saline mixed with 1 cc of ropivacaine 0.2% and 40 mg of triamcinolone. I then performed a trigger point injection with 4 mg of Decadron and 5 cc of ropivacaine 0.2% in a fanlike distribution with a 25-gauge needle to the right gluteal region. This was tolerated without difficulty the patient was convalesced and discharged home in stable condition for follow-up as mentioned. JA  Dr. Gwenyth Bender M.D.

## 2016-12-24 NOTE — Progress Notes (Signed)
Nursing Pain Medication Assessment:  Safety precautions to be maintained throughout the outpatient stay will include: orient to surroundings, keep bed in low position, maintain call bell within reach at all times, provide assistance with transfer out of bed and ambulation.  Medication Inspection Compliance: Madison [name] did not comply with our request to bring her pills to be counted. She was reminded that bringing the medication bottles, even when empty, is a requirement.  Medication: See above Pill/Patch Count: No pills available to be counted. Pill/Patch Appearance: No markings Bottle Appearance: No container available. Did not bring bottle(s) to appointment. Filled Date: 04 / 18 / 2018 Last Medication intake:  Today

## 2016-12-25 ENCOUNTER — Telehealth: Payer: Self-pay

## 2016-12-25 NOTE — Telephone Encounter (Signed)
Post procedure phone call.  Phone number disconnected.

## 2017-02-10 ENCOUNTER — Ambulatory Visit: Payer: Self-pay | Attending: Anesthesiology | Admitting: Anesthesiology

## 2017-02-10 ENCOUNTER — Encounter: Payer: Self-pay | Admitting: Anesthesiology

## 2017-02-10 VITALS — BP 116/63 | HR 77 | Temp 98.2°F | Resp 16 | Ht 59.5 in | Wt 200.0 lb

## 2017-02-10 DIAGNOSIS — M5431 Sciatica, right side: Secondary | ICD-10-CM

## 2017-02-10 DIAGNOSIS — M5386 Other specified dorsopathies, lumbar region: Secondary | ICD-10-CM

## 2017-02-10 DIAGNOSIS — F1190 Opioid use, unspecified, uncomplicated: Secondary | ICD-10-CM

## 2017-02-10 DIAGNOSIS — M545 Low back pain: Secondary | ICD-10-CM | POA: Insufficient documentation

## 2017-02-10 DIAGNOSIS — Z981 Arthrodesis status: Secondary | ICD-10-CM | POA: Insufficient documentation

## 2017-02-10 DIAGNOSIS — M5136 Other intervertebral disc degeneration, lumbar region: Secondary | ICD-10-CM | POA: Insufficient documentation

## 2017-02-10 MED ORDER — HYDROCODONE-ACETAMINOPHEN 5-325 MG PO TABS: 1.0000 | ORAL_TABLET | Freq: Four times a day (QID) | ORAL | 0 refills | Status: DC | PRN

## 2017-02-10 NOTE — Progress Notes (Signed)
Chief complaint: Severe low back pain with radiation into the right hip and buttock region.  Procedure: None  Previous Procedure: Caudal epidural steroid No. 3 under fluoroscopic guidance and moderate rotation and right gluteal trigger point injection 07 December 2016 Previous Procedure: Caudal epidural steroid No. 2under fluoroscopic guidance with moderate sedation Feb 2018  Previous Procedure: Bilateral lumbar medial branch block to the facet at L3-4 L4-5 L5-S1 and S1 under fluoroscopic guidance with moderate sedation December 2017                                    Right Side lumbar facet medial branch block at L2-3 and L3-4 L4-5 L5-S1 and S1 under fluoroscopic guidance with moderate sedation  History of present illness:Madison [name] presents for follow-up today. She had her third caudal at her last visit in April. She's done very well and states that her right lower leg pain has resolved. She is having some mild right hip and buttock pain. She's had previous injections with facet blocks for this in the past and done well. At this point she has been doing well though was doing some yard work recently and slipped about 2 weeks ago which precipitated some left medial thigh pain but this has since resolved. She's taking her medications on an as-needed basis and averaging about 60-90 hydrocodone 5 mg tablets per month. She's been compliant with her regimen. Otherwise based on her narcotic assessment sheet she's doing well with an improved overall lifestyle function with the medications.  . Current Outpatient Prescriptions:  .  atorvastatin (LIPITOR) 40 MG tablet, Take 40 mg by mouth daily., Disp: , Rfl:  .  Cholecalciferol (VITAMIN D3 PO), Take by mouth daily., Disp: , Rfl:  .  cyanocobalamin (V-R VITAMIN B-12) 500 MCG tablet, Take 500 mcg by mouth daily. , Disp: , Rfl:  .  dextromethorphan-guaiFENesin (MUCINEX DM) 30-600 MG 12hr tablet, Take 1 tablet by mouth 2 (two) times daily., Disp: , Rfl:  .   docusate sodium (COLACE) 100 MG capsule, Take 1 capsule (100 mg total) by mouth 2 (two) times daily., Disp: 60 capsule, Rfl: 0 .  DULoxetine (CYMBALTA) 60 MG capsule, Take 60 mg by mouth every evening. Patient states she also takes a 30mg  in the morning per patient, Disp: , Rfl:  .  furosemide (LASIX) 20 MG tablet, Take 20 mg by mouth 3 (three) times daily as needed for fluid., Disp: , Rfl:  .  HYDROcodone-acetaminophen (NORCO/VICODIN) 5-325 MG tablet, Take 1 tablet by mouth every 6 (six) hours as needed for moderate pain or severe pain., Disp: 90 tablet, Rfl: 0 .  loratadine-pseudoephedrine (CLARITIN-D 12-HOUR) 5-120 MG per tablet, Take 1 tablet by mouth daily. , Disp: , Rfl:  .  methocarbamol (ROBAXIN) 750 MG tablet, Take 1 tablet (750 mg total) by mouth 3 (three) times daily., Disp: 90 tablet, Rfl: 5 .  metoprolol (LOPRESSOR) 50 MG tablet, Take 50 mg by mouth 2 (two) times daily. , Disp: , Rfl:  .  naproxen (NAPROSYN) 500 MG tablet, , Disp: , Rfl:  .  Olopatadine HCl (PATADAY) 0.2 % SOLN, Place 1 drop into both eyes daily as needed. For dry eyes, Disp: , Rfl:  .  potassium chloride (K-DUR) 10 MEQ tablet, , Disp: , Rfl:  .  tamsulosin (FLOMAX) 0.4 MG CAPS capsule, , Disp: , Rfl:  .  fluticasone (FLONASE) 50 MCG/ACT nasal spray, Place into the nose.,  Disp: , Rfl:   Current Facility-Administered Medications:  .  iopamidol (ISOVUE-M) 41 % intrathecal injection 20 mL, 20 mL, Other, Once PRN, Yevette EdwardsAdams, James G, MD, 20 mL at 09/22/16 1553 .  lactated ringers infusion 1,000 mL, 1,000 mL, Intravenous, Continuous, Adams, Currie ParisJames G, MD .  lactated ringers infusion 1,000 mL, 1,000 mL, Intravenous, Continuous, Adams, Currie ParisJames G, MD .  lidocaine (PF) (XYLOCAINE) 1 % injection 5 mL, 5 mL, Subcutaneous, Once, Yevette EdwardsAdams, James G, MD .  midazolam (VERSED) injection 5 mg, 5 mg, Intravenous, Once, Yevette EdwardsAdams, James G, MD .  ropivacaine (PF) 2 mg/mL (0.2%) (NAROPIN) injection 10 mL, 10 mL, Epidural, Once, Yevette EdwardsAdams, James G, MD .   sodium chloride flush (NS) 0.9 % injection 10 mL, 10 mL, Other, Once, Yevette EdwardsAdams, James G, MD .  triamcinolone acetonide (KENALOG-40) injection 40 mg, 40 mg, Other, Once, Pernell DupreAdams, Currie ParisJames G, MD  Facility-Administered Medications Ordered in Other Visits:  .  iopamidol (ISOVUE-M) 41 % intrathecal injection 20 mL, 20 mL, Other, Once PRN, Yevette EdwardsAdams, James G, MD, 10 mL at 12/24/16 1416 .  lactated ringers infusion 1,000 mL, 1,000 mL, Intravenous, Continuous, Adams, Currie ParisJames G, MD .  midazolam (VERSED) 5 MG/5ML injection 5 mg, 5 mg, Intravenous, Once, Yevette EdwardsAdams, James G, MD .  ropivacaine (PF) 2 mg/mL (0.2%) (NAROPIN) injection 10 mL, 10 mL, Epidural, Once, Yevette EdwardsAdams, James G, MD .  sodium chloride flush (NS) 0.9 % injection 10 mL, 10 mL, Other, Once, Yevette EdwardsAdams, James G, MD .  triamcinolone acetonide (KENALOG-40) injection 40 mg, 40 mg, Other, Once, Yevette EdwardsAdams, James G, MD  BP 116/63 (BP Location: Left Arm, Patient Position: Sitting, Cuff Size: Normal)   Pulse 77   Temp 98.2 F (36.8 C) (Oral)   Resp 16   Ht 4' 11.5" (1.511 m)   Wt 200 lb (90.7 kg)   SpO2 97%   BMI 39.72 kg/m   Patient Active Problem List   Diagnosis Date Noted  . Spondylolisthesis of lumbar region 05/26/2016  . Facet arthritis of cervical region (HCC) 03/05/2015  . DDD (degenerative disc disease), lumbar 03/05/2015    Allergies  Allergen Reactions  . Eggs Or Egg-Derived Products Anaphylaxis    Egg albumin  . Morphine And Related     unknown  . Spiriva Handihaler [Tiotropium Bromide Monohydrate] Other (See Comments)    bronchiospasms  . Sulfa Antibiotics Hives  . Penicillins Rash    Has patient had a PCN reaction causing immediate rash, facial/tongue/throat swelling, SOB or lightheadedness with hypotension: YES Has patient had a PCN reaction causing severe rash involving mucus membranes or skin necrosis: NO Has patient had a PCN reaction that required hospitalization NO Has patient had a PCN reaction occurring within the last 10 years: NO If all  of the above answers are "NO", then may proceed with Cephalosporin use.  . Voltaren [Diclofenac] Rash   Review of systems: GI: No significant constipation  Cardiac: No angina or shortness of breath GI: No constipation No other changes are noted   Physical exam: Patient is alert and oriented 3 and cooperative and compliant.  Heart is regular rate and rhythm without murmur  Her strength appears to be at baseline and she continues to have good muscle tone and bulk  Assessment:   #1 bilateral  facet arthropathy  #2 degenerative disc disease now status post laminectomy with fusion with L5-S1 radiculitis right that has diminished following a series of caudal epidural steroids  #3 myofascial low back pain  #4 chronic opioid management  Plan:  #  1 we will defer on any repeat injections today with return to clinic in 2 months. She is to continue with her current stretching strengthening program and medication management. We have reviewed the [facility] practitioner database and it is appropriate.  Dr. Gwenyth Bender M.D.

## 2017-02-10 NOTE — Progress Notes (Signed)
Nursing Pain Medication Assessment:  Safety precautions to be maintained throughout the outpatient stay will include: orient to surroundings, keep bed in low position, maintain call bell within reach at all times, provide assistance with transfer out of bed and ambulation.  Medication Inspection Compliance: Pill count conducted under aseptic conditions, in front of the patient. Neither the pills nor the bottle was removed from the patient's sight at any time. Once count was completed pills were immediately returned to the patient in their original bottle.  Medication: Hydrocodone/APAP Pill/Patch Count: 39 of 90 pills remain Pill/Patch Appearance: Markings consistent with prescribed medication Bottle Appearance: Standard pharmacy container. Clearly labeled. Filled Date: 05 / 18 / 2018 Last Medication intake:  Today

## 2017-04-23 ENCOUNTER — Ambulatory Visit: Payer: Self-pay | Attending: Anesthesiology | Admitting: Anesthesiology

## 2017-04-23 ENCOUNTER — Encounter: Payer: Self-pay | Admitting: Anesthesiology

## 2017-04-23 VITALS — BP 139/53 | HR 79 | Temp 98.4°F | Resp 18 | Ht 59.0 in | Wt 203.0 lb

## 2017-04-23 DIAGNOSIS — M5441 Lumbago with sciatica, right side: Secondary | ICD-10-CM

## 2017-04-23 DIAGNOSIS — F1190 Opioid use, unspecified, uncomplicated: Secondary | ICD-10-CM

## 2017-04-23 DIAGNOSIS — M5417 Radiculopathy, lumbosacral region: Secondary | ICD-10-CM | POA: Insufficient documentation

## 2017-04-23 DIAGNOSIS — M545 Low back pain: Secondary | ICD-10-CM | POA: Insufficient documentation

## 2017-04-23 DIAGNOSIS — M5136 Other intervertebral disc degeneration, lumbar region: Secondary | ICD-10-CM | POA: Insufficient documentation

## 2017-04-23 DIAGNOSIS — G894 Chronic pain syndrome: Secondary | ICD-10-CM

## 2017-04-23 DIAGNOSIS — M25551 Pain in right hip: Secondary | ICD-10-CM | POA: Insufficient documentation

## 2017-04-23 DIAGNOSIS — M5386 Other specified dorsopathies, lumbar region: Secondary | ICD-10-CM

## 2017-04-23 DIAGNOSIS — Z880 Allergy status to penicillin: Secondary | ICD-10-CM | POA: Insufficient documentation

## 2017-04-23 DIAGNOSIS — G8929 Other chronic pain: Secondary | ICD-10-CM

## 2017-04-23 DIAGNOSIS — M4692 Unspecified inflammatory spondylopathy, cervical region: Secondary | ICD-10-CM | POA: Insufficient documentation

## 2017-04-23 DIAGNOSIS — Z981 Arthrodesis status: Secondary | ICD-10-CM | POA: Insufficient documentation

## 2017-04-23 DIAGNOSIS — M47812 Spondylosis without myelopathy or radiculopathy, cervical region: Secondary | ICD-10-CM

## 2017-04-23 DIAGNOSIS — M4316 Spondylolisthesis, lumbar region: Secondary | ICD-10-CM | POA: Insufficient documentation

## 2017-04-23 MED ORDER — HYDROCODONE-ACETAMINOPHEN 5-325 MG PO TABS: 1.0000 | ORAL_TABLET | Freq: Three times a day (TID) | ORAL | 0 refills | Status: DC

## 2017-04-23 NOTE — Patient Instructions (Signed)
Do not eat or drink 3 hours before your myoneural block.

## 2017-04-23 NOTE — Progress Notes (Signed)
Nursing Pain Medication Assessment:  Safety precautions to be maintained throughout the outpatient stay will include: orient to surroundings, keep bed in low position, maintain call bell within reach at all times, provide assistance with transfer out of bed and ambulation.  Medication Inspection Compliance: Pill count conducted under aseptic conditions, in front of the patient. Neither the pills nor the bottle was removed from the patient's sight at any time. Once count was completed pills were immediately returned to the patient in their original bottle.  Medication: Hydrocodone/APAP Pill/Patch Count: 9 of 90 pills remain Pill/Patch Appearance: Markings consistent with prescribed medication Bottle Appearance: Standard pharmacy container. Clearly labeled. Filled Date:07/17/ 2018 Last Medication intake:  Today

## 2017-04-26 NOTE — Progress Notes (Signed)
Chief complaint: Severe low back pain with radiation into the right hip and buttock region.  Procedure: None  Previous Procedure: Caudal epidural steroid No. 3 under fluoroscopic guidance and moderate rotation and right gluteal trigger point injection 07 December 2016 Previous Procedure: Caudal epidural steroid No. 2under fluoroscopic guidance with moderate sedation Feb 2018  Previous Procedure: Bilateral lumbar medial branch block to the facet at L3-4 L4-5 L5-S1 and S1 under fluoroscopic guidance with moderate sedation December 2017                                    Right Side lumbar facet medial branch block at L2-3 and L3-4 L4-5 L5-S1 and S1 under fluoroscopic guidance with moderate sedation  History of present illness:Madison [name] presents for reevaluation. She was last seen in June. She states that she's been doing relatively well with her low back pain. She still taking her medications including Vicodin 5 mg 3 times a day. The pain she experiences is mainly a gnawing aching low back pain with occasional radiation into the posterior buttocks. Otherwise she is in her usual state of health with no new troubles with her bowel or bladder function or lower extremity strength or function. We have reviewed her narcotic assessment sheet and she continues to derive good lifestyle improvement in function with her medications. No troubles are reported with her medicines. She has had some mild spasming in the lumbar region as of recent. . Current Outpatient Prescriptions:  .  atorvastatin (LIPITOR) 40 MG tablet, Take 40 mg by mouth daily., Disp: , Rfl:  .  Cholecalciferol (VITAMIN D3 PO), Take by mouth daily., Disp: , Rfl:  .  cyanocobalamin (V-R VITAMIN B-12) 500 MCG tablet, Take 500 mcg by mouth daily. , Disp: , Rfl:  .  dextromethorphan-guaiFENesin (MUCINEX DM) 30-600 MG 12hr tablet, Take 1 tablet by mouth 2 (two) times daily., Disp: , Rfl:  .  DULoxetine (CYMBALTA) 60 MG capsule, Take 60 mg by mouth  every evening. Patient states she also takes a 30mg  in the morning per patient, Disp: , Rfl:  .  furosemide (LASIX) 20 MG tablet, Take 20 mg by mouth 3 (three) times daily as needed for fluid., Disp: , Rfl:  .  HYDROcodone-acetaminophen (NORCO/VICODIN) 5-325 MG tablet, Take 1 tablet by mouth 3 (three) times daily., Disp: 90 tablet, Rfl: 0 .  loratadine-pseudoephedrine (CLARITIN-D 12-HOUR) 5-120 MG per tablet, Take 1 tablet by mouth daily. , Disp: , Rfl:  .  methocarbamol (ROBAXIN) 750 MG tablet, Take 1 tablet (750 mg total) by mouth 3 (three) times daily., Disp: 90 tablet, Rfl: 5 .  metoprolol (LOPRESSOR) 50 MG tablet, Take 50 mg by mouth 2 (two) times daily. , Disp: , Rfl:  .  naproxen (NAPROSYN) 500 MG tablet, , Disp: , Rfl:  .  Olopatadine HCl (PATADAY) 0.2 % SOLN, Place 1 drop into both eyes daily as needed. For dry eyes, Disp: , Rfl:  .  potassium chloride (K-DUR) 10 MEQ tablet, , Disp: , Rfl:  .  tamsulosin (FLOMAX) 0.4 MG CAPS capsule, , Disp: , Rfl:  .  docusate sodium (COLACE) 100 MG capsule, Take 1 capsule (100 mg total) by mouth 2 (two) times daily. (Patient not taking: Reported on 04/23/2017), Disp: 60 capsule, Rfl: 0 .  fluticasone (FLONASE) 50 MCG/ACT nasal spray, Place into the nose., Disp: , Rfl:   Current Facility-Administered Medications:  .  iopamidol (ISOVUE-M) 41 %  intrathecal injection 20 mL, 20 mL, Other, Once PRN, Yevette Edwards, MD, 20 mL at 09/22/16 1553 .  lactated ringers infusion 1,000 mL, 1,000 mL, Intravenous, Continuous, Relda Agosto, Currie Paris, MD .  lactated ringers infusion 1,000 mL, 1,000 mL, Intravenous, Continuous, Chidera Dearcos, Currie Paris, MD .  lidocaine (PF) (XYLOCAINE) 1 % injection 5 mL, 5 mL, Subcutaneous, Once, Pernell Dupre, Currie Paris, MD .  midazolam (VERSED) injection 5 mg, 5 mg, Intravenous, Once, Yevette Edwards, MD .  ropivacaine (PF) 2 mg/mL (0.2%) (NAROPIN) injection 10 mL, 10 mL, Epidural, Once, Yevette Edwards, MD .  sodium chloride flush (NS) 0.9 % injection 10 mL, 10 mL,  Other, Once, Yevette Edwards, MD .  triamcinolone acetonide (KENALOG-40) injection 40 mg, 40 mg, Other, Once, Yevette Edwards, MD  BP (!) 139/53   Pulse 79   Temp 98.4 F (36.9 C) (Oral)   Resp 18   Ht 4\' 11"  (1.499 m)   Wt 203 lb (92.1 kg)   SpO2 99%   BMI 41.00 kg/m   Patient Active Problem List   Diagnosis Date Noted  . Spondylolisthesis of lumbar region 05/26/2016  . Facet arthritis of cervical region (HCC) 03/05/2015  . DDD (degenerative disc disease), lumbar 03/05/2015    Allergies  Allergen Reactions  . Eggs Or Egg-Derived Products Anaphylaxis    Egg albumin  . Morphine And Related     unknown  . Spiriva Handihaler [Tiotropium Bromide Monohydrate] Other (See Comments)    bronchiospasms  . Sulfa Antibiotics Hives  . Penicillins Rash    Has patient had a PCN reaction causing immediate rash, facial/tongue/throat swelling, SOB or lightheadedness with hypotension: YES Has patient had a PCN reaction causing severe rash involving mucus membranes or skin necrosis: NO Has patient had a PCN reaction that required hospitalization NO Has patient had a PCN reaction occurring within the last 10 years: NO If all of the above answers are "NO", then may proceed with Cephalosporin use.  . Voltaren [Diclofenac] Rash   Review of systems: GI: No significant constipation  Cardiac: No angina or shortness of breath GI: No constipation CNS: No sedation   Physical exam: Patient is alert and oriented 3 and cooperative and compliant.  Heart is regular rate and rhythm without murmur  Her lower extremity strength and function are at baseline with some mild paraspinous muscle tenderness and what appears to be a trigger point in the low lumbar region bilaterally.  Assessment:   #1 bilateral  facet arthropathy  #2 degenerative disc disease now status post laminectomy with fusion with L5-S1 radiculitis right that has diminished following a series of caudal epidural steroids  #3 myofascial  low back pain  #4 chronic opioid management  Plan:  #1 going to refill her medications for the next 2 months. She will continue at her current dosing regimen for August 16 and September 15. We'll have her return to clinic in a possible trigger point injection for the spasming in the low back if it persists. In the meantime she is to continue with stretching strengthening exercises as discussed in detail today.  Greater than 50% of the time for this evaluation was for counseling and coordination of care. Dr. Gwenyth Bender M.D.

## 2017-04-28 LAB — TOXASSURE SELECT 13 (MW), URINE

## 2017-05-13 ENCOUNTER — Other Ambulatory Visit: Payer: Self-pay | Admitting: Anesthesiology

## 2017-05-13 NOTE — Telephone Encounter (Signed)
Patient lvmail asking for refill on Robaxin. Please let her know if this can be called in

## 2017-05-16 ENCOUNTER — Other Ambulatory Visit: Payer: Self-pay | Admitting: Anesthesiology

## 2017-05-18 ENCOUNTER — Telehealth: Payer: Self-pay | Admitting: *Deleted

## 2017-05-18 NOTE — Telephone Encounter (Signed)
Left voicemail for patient re; medication question.

## 2017-05-19 NOTE — Telephone Encounter (Signed)
Needs robaxin refilled, please call  I did inform patient Dr. Pernell Dupredams is out of town this week and we would not be able tto contact her until he was back in office

## 2017-05-26 ENCOUNTER — Telehealth: Payer: Self-pay | Admitting: Anesthesiology

## 2017-05-26 NOTE — Telephone Encounter (Addendum)
Patient wants to get refill on robaxin. Please call her when she can pick up at pharmacy Please give patient appt date of 06-17-17 at 3:30

## 2017-05-28 ENCOUNTER — Other Ambulatory Visit: Payer: Self-pay | Admitting: *Deleted

## 2017-05-28 MED ORDER — METHOCARBAMOL 750 MG PO TABS: 750.0000 mg | ORAL_TABLET | Freq: Four times a day (QID) | ORAL | 0 refills | Status: DC

## 2017-05-28 NOTE — Telephone Encounter (Signed)
Voicemail left with patient that Robaxin has been escribed.

## 2017-05-29 NOTE — Telephone Encounter (Signed)
Patient notified that Robaxin had been sent to the pharmacy by Dr Pernell Dupre yesterday.

## 2017-06-01 NOTE — Telephone Encounter (Signed)
Robaxin was called.

## 2017-06-17 ENCOUNTER — Ambulatory Visit: Payer: Medicare Other | Admitting: Anesthesiology

## 2017-06-17 ENCOUNTER — Other Ambulatory Visit: Payer: Self-pay | Admitting: Anesthesiology

## 2017-06-17 ENCOUNTER — Ambulatory Visit
Admission: RE | Admit: 2017-06-17 | Discharge: 2017-06-17 | Disposition: A | Discharge status: Home / Self Care | Payer: Medicare Other | Source: Ambulatory Visit | Attending: Anesthesiology | Admitting: Anesthesiology

## 2017-06-17 DIAGNOSIS — R52 Pain, unspecified: Secondary | ICD-10-CM

## 2017-06-18 ENCOUNTER — Ambulatory Visit: Payer: Self-pay | Attending: Anesthesiology | Admitting: Anesthesiology

## 2017-06-18 ENCOUNTER — Encounter: Payer: Self-pay | Admitting: Anesthesiology

## 2017-06-18 VITALS — BP 130/63 | HR 70 | Temp 98.6°F | Resp 18 | Ht 59.5 in | Wt 202.5 lb

## 2017-06-18 DIAGNOSIS — M5441 Lumbago with sciatica, right side: Secondary | ICD-10-CM

## 2017-06-18 DIAGNOSIS — M5136 Other intervertebral disc degeneration, lumbar region: Secondary | ICD-10-CM

## 2017-06-18 DIAGNOSIS — M4692 Unspecified inflammatory spondylopathy, cervical region: Secondary | ICD-10-CM

## 2017-06-18 DIAGNOSIS — M5431 Sciatica, right side: Secondary | ICD-10-CM | POA: Insufficient documentation

## 2017-06-18 DIAGNOSIS — F1190 Opioid use, unspecified, uncomplicated: Secondary | ICD-10-CM

## 2017-06-18 DIAGNOSIS — M47812 Spondylosis without myelopathy or radiculopathy, cervical region: Secondary | ICD-10-CM

## 2017-06-18 DIAGNOSIS — G8929 Other chronic pain: Secondary | ICD-10-CM

## 2017-06-18 DIAGNOSIS — G894 Chronic pain syndrome: Secondary | ICD-10-CM | POA: Insufficient documentation

## 2017-06-18 DIAGNOSIS — M5386 Other specified dorsopathies, lumbar region: Secondary | ICD-10-CM | POA: Insufficient documentation

## 2017-06-18 MED ORDER — METHOCARBAMOL 750 MG PO TABS: 750.0000 mg | ORAL_TABLET | Freq: Four times a day (QID) | ORAL | 5 refills | Status: DC

## 2017-06-18 MED ORDER — ROPIVACAINE HCL 2 MG/ML IJ SOLN
10.0000 mL | Freq: Once | INTRAMUSCULAR | Status: AC
  Administered 2017-06-18: 9 mL via EPIDURAL

## 2017-06-18 MED ORDER — DEXAMETHASONE SODIUM PHOSPHATE 10 MG/ML IJ SOLN
10.0000 mg | Freq: Once | INTRAMUSCULAR | Status: AC
  Administered 2017-06-18: 10 mg

## 2017-06-18 MED ORDER — DEXAMETHASONE SODIUM PHOSPHATE 10 MG/ML IJ SOLN
INTRAMUSCULAR | Status: AC
  Filled 2017-06-18: qty 1

## 2017-06-18 MED ORDER — HYDROCODONE-ACETAMINOPHEN 5-325 MG PO TABS: 1.0000 | ORAL_TABLET | Freq: Three times a day (TID) | ORAL | 0 refills | Status: DC

## 2017-06-18 MED ORDER — ROPIVACAINE HCL 2 MG/ML IJ SOLN
INTRAMUSCULAR | Status: AC
  Filled 2017-06-18: qty 10

## 2017-06-18 NOTE — Progress Notes (Signed)
\  [id number] Pain Medication Assessment:  Safety precautions to be maintained throughout the outpatient stay will include: orient to surroundings, keep bed in low position, maintain call bell within reach at all times, provide assistance with transfer out of bed and ambulation.  Medication Inspection Compliance: Pill count conducted under aseptic conditions, in front of the patient. Neither the pills nor the bottle was removed from the patient's sight at any time. Once count was completed pills were immediately returned to the patient in their original bottle.  Medication: Hydrocodone/APAP Pill/Patch Count: 21 of 90 pills remain Pill/Patch Appearance: Markings consistent with prescribed medication Bottle Appearance: Standard pharmacy container. Clearly labeled. Filled Date: 09 / 18 / 2018 Last Medication intake:  Today

## 2017-06-18 NOTE — Progress Notes (Signed)
Subjective:  Patient ID: Madison [name], female    DOB: 20-Feb-1951  Age: 66 y.o. MRN: [id number]  CC: No chief complaint on file.   Procedure: Right gluteal trigger point injection 2 #2   HPI Madison [name] presents for reevaluation. She was last seen several weeks ago at which point she'll first right side gluteal trigger point injection. She is also had a series of caudal epidurals following her back surgery. This combination has alleviated her right lower radicular pain and she still having some mild to moderate pain in the right gluteal region. This is more spasming pain with some occasional radiation into the right posterior leg but not down in the calf or foot. She is taking her medications generally 2-3 times a day with the Vicodin and this is well tolerated.  Reviewed her narcotic assessment sheet and she continues to derive good lifestyle functional improvement with her medications. No change in lower extremity strength or function or bowel bladder function are noted at this time and otherwise her situation has been stable. She desires to proceed with a repeat trigger point injection today.  Outpatient Medications Prior to Visit  Medication Sig Dispense Refill  . atorvastatin (LIPITOR) 40 MG tablet Take 40 mg by mouth daily.    . Cholecalciferol (VITAMIN D3 PO) Take by mouth daily.    . cyanocobalamin (V-R VITAMIN B-12) 500 MCG tablet Take 500 mcg by mouth daily.     Marland Kitchen dextromethorphan-guaiFENesin (MUCINEX DM) 30-600 MG 12hr tablet Take 1 tablet by mouth 2 (two) times daily.    Marland Kitchen docusate sodium (COLACE) 100 MG capsule Take 1 capsule (100 mg total) by mouth 2 (two) times daily. 60 capsule 0  . fluticasone (FLONASE) 50 MCG/ACT nasal spray Place into the nose.    . furosemide (LASIX) 20 MG tablet Take 20 mg by mouth 3 (three) times daily as needed for fluid.    Marland Kitchen loratadine-pseudoephedrine (CLARITIN-D 12-HOUR) 5-120 MG per tablet Take 1 tablet by mouth daily.     . metoprolol  (LOPRESSOR) 50 MG tablet Take 50 mg by mouth 2 (two) times daily.     . naproxen (NAPROSYN) 500 MG tablet     . Olopatadine HCl (PATADAY) 0.2 % SOLN Place 1 drop into both eyes daily as needed. For dry eyes    . potassium chloride (K-DUR) 10 MEQ tablet     . tamsulosin (FLOMAX) 0.4 MG CAPS capsule     . HYDROcodone-acetaminophen (NORCO/VICODIN) 5-325 MG tablet Take 1 tablet by mouth 3 (three) times daily. 90 tablet 0  . methocarbamol (ROBAXIN-750) 750 MG tablet Take 1 tablet (750 mg total) by mouth 4 (four) times daily. 120 tablet 0  . DULoxetine (CYMBALTA) 60 MG capsule Take 60 mg by mouth every evening. Patient states she also takes a  in the morning per patient    . methocarbamol (ROBAXIN) 750 MG tablet take 1 tablet by mouth three times a day (Patient not taking: Reported on 06/18/2017) 90 tablet 5   Facility-Administered Medications Prior to Visit  Medication Dose Route Frequency Provider Last Rate Last Dose  . iopamidol (ISOVUE-M) 41 % intrathecal injection 20 mL  20 mL Other Once PRN Yevette Edwards, MD   20 mL at 09/22/16 1553  . lactated ringers infusion 1,000 mL  1,000 mL Intravenous Continuous Yevette Edwards, MD      . lactated ringers infusion 1,000 mL  1,000 mL Intravenous Continuous Yevette Edwards, MD      .  lidocaine (PF) (XYLOCAINE) 1 % injection 5 mL  5 mL Subcutaneous Once Yevette Edwards, MD      . midazolam (VERSED) injection 5 mg  5 mg Intravenous Once Yevette Edwards, MD      . ropivacaine (PF) 2 mg/mL (0.2%) (NAROPIN) injection 10 mL  10 mL Epidural Once Yevette Edwards, MD      . sodium chloride flush (NS) 0.9 % injection 10 mL  10 mL Other Once Yevette Edwards, MD      . triamcinolone acetonide (KENALOG-40) injection 40 mg  40 mg Other Once Yevette Edwards, MD        Review of Systems CNS: No sedation or confusion Cardiac: No angina or palpitations GI: No constipation or abdominal pain  Objective:  BP 130/63   Pulse 70   Temp 98.6 F (37 C) (Oral)   Resp 18    Ht 4' 11.5" (1.511 m)   Wt 202 lb 8 oz (91.9 kg)   SpO2 100%   BMI 40.22 kg/m    BP Readings from Last 3 Encounters:  06/18/17 130/63  04/23/17 (!) 139/53  02/10/17 116/63     Wt Readings from Last 3 Encounters:  06/18/17 202 lb 8 oz (91.9 kg)  04/23/17 203 lb (92.1 kg)  02/10/17 200 lb (90.7 kg)     Physical Exam Pt is alert and oriented PERRL EOMI HEART IS RRR no murmur or rub LCTA no wheezing or rhales MUSCULOSKELETALReveals a trigger point in the right gluteal region overlying the superior posterior iliac crest. Her lower extremity muscle tone and bulk is at baseline and she continues with an antalgic gait.  Labs  Lab Results  Component Value Date   HGBA1C 6.1 11/22/2013   Lab Results  Component Value Date   CREATININE 0.77 05/27/2016    -------------------------------------------------------------------------------------------------------------------- Lab Results  Component Value Date   WBC 12.2 (H) 05/27/2016   HGB 11.0 (L) 05/27/2016   HCT 35.3 (L) 05/27/2016   PLT 177 05/27/2016   GLUCOSE 115 (H) 05/27/2016   ALT 48 09/27/2014   AST 59 (H) 09/27/2014   NA 137 05/27/2016   K 4.5 05/27/2016   CL 103 05/27/2016   CREATININE 0.77 05/27/2016   BUN 19 05/27/2016   CO2 30 05/27/2016   TSH 0.402 (L) 11/23/2013   INR 0.9 10/26/2013   HGBA1C 6.1 11/22/2013    --------------------------------------------------------------------------------------------------------------------- No results found.   Assessment & Plan:   Diagnoses and all orders for this visit:  Chronic pain syndrome -     dexamethasone (DECADRON) injection 10 mg; 1 mL (10 mg total) by Other route once. -     ropivacaine (PF) 2 mg/mL (0.2%) (NAROPIN) injection 10 mL; 10 mLs by Epidural route once.  Low back derangement syndrome -     dexamethasone (DECADRON) injection 10 mg; 1 mL (10 mg total) by Other route once. -     ropivacaine (PF) 2 mg/mL (0.2%) (NAROPIN) injection 10 mL; 10 mLs  by Epidural route once.  Chronic, continuous use of opioids  Facet arthritis of cervical region [facility])  Chronic right-sided low back pain with right-sided sciatica  Sciatica of right side -     Lumbar Epidural Injection; Future  DDD (degenerative disc disease), lumbar  Other orders -     Discontinue: HYDROcodone-acetaminophen (NORCO/VICODIN) 5-325 MG tablet; Take 1 tablet by mouth 3 (three) times daily. -     HYDROcodone-acetaminophen (NORCO/VICODIN) 5-325 MG tablet; Take 1 tablet by mouth 3 (three) times  daily. -     methocarbamol (ROBAXIN-750) 750 MG tablet; Take 1 tablet (750 mg total) by mouth 4 (four) times daily.        ----------------------------------------------------------------------------------------------------------------------  Problem List Items Addressed This Visit      Unprioritized   DDD (degenerative disc disease), lumbar   Relevant Medications   HYDROcodone-acetaminophen (NORCO/VICODIN) 5-325 MG tablet   dexamethasone (DECADRON) injection 10 mg   methocarbamol (ROBAXIN-750) 750 MG tablet   Facet arthritis of cervical region (HCC)   Relevant Medications   HYDROcodone-acetaminophen (NORCO/VICODIN) 5-325 MG tablet   dexamethasone (DECADRON) injection 10 mg   methocarbamol (ROBAXIN-750) 750 MG tablet    Other Visit Diagnoses    Chronic pain syndrome    -  Primary   Relevant Medications   dexamethasone (DECADRON) injection 10 mg   ropivacaine (PF) 2 mg/mL (0.2%) (NAROPIN) injection 10 mL   Low back derangement syndrome       Relevant Medications   HYDROcodone-acetaminophen (NORCO/VICODIN) 5-325 MG tablet   dexamethasone (DECADRON) injection 10 mg   ropivacaine (PF) 2 mg/mL (0.2%) (NAROPIN) injection 10 mL   methocarbamol (ROBAXIN-750) 750 MG tablet   Chronic, continuous use of opioids       Chronic right-sided low back pain with right-sided sciatica       Relevant Medications   HYDROcodone-acetaminophen (NORCO/VICODIN) 5-325 MG tablet    dexamethasone (DECADRON) injection 10 mg   methocarbamol (ROBAXIN-750) 750 MG tablet   Sciatica of right side       Relevant Medications   methocarbamol (ROBAXIN-750) 750 MG tablet   Other Relevant Orders   Lumbar Epidural Injection        ----------------------------------------------------------------------------------------------------------------------  1. Chronic pain syndrome We'll proceed with a repeat trigger point injection. This was her second. We'll have return to clinic in 2 months for reevaluation. She is to continue with her stretching strengthening exercises and we have talked about a tens unit application twice a day for her right gluteal spasming. I'm also going to refill her Robaxin today - dexamethasone (DECADRON) injection 10 mg; 1 mL (10 mg total) by Other route once. - ropivacaine (PF) 2 mg/mL (0.2%) (NAROPIN) injection 10 mL; 10 mLs by Epidural route once.  2. Low back derangement syndrome We'll continue with her medication management as well. - dexamethasone (DECADRON) injection 10 mg; 1 mL (10 mg total) by Other route once. - ropivacaine (PF) 2 mg/mL (0.2%) (NAROPIN) injection 10 mL; 10 mLs by Epidural route once.  3. Chronic, continuous use of opioids Refill on her Vicodin for October 14 and November 13. Return to clinic in 2 months and we have reviewed the [facility] practitioner database information and it is appropriate.  4. Facet arthritis of cervical region (HCC) Continue with stretching exercises  5. Chronic right-sided low back pain with right-sided sciatica We'll schedule her for repeat caudal epidural injection at her next visit in 2 months. She will likely require these periodically for the persistent and recurrent right side L5-S1 sciatica that she experiences. Fortunately she has been receptive to these and they have provided her good relief. The meantime continue with core stretching strengthening exercises.  6. Sciatica of right side As  above - Lumbar Epidural Injection; Future  7. DDD (degenerative disc disease), lumbar As above    ----------------------------------------------------------------------------------------------------------------------  I am having Madison [name] maintain her cyanocobalamin, fluticasone, loratadine-pseudoephedrine, Olopatadine HCl, metoprolol tartrate, atorvastatin, DULoxetine, furosemide, dextromethorphan-guaiFENesin, docusate sodium, naproxen, potassium chloride, tamsulosin, Cholecalciferol (VITAMIN D3 PO), methocarbamol, HYDROcodone-acetaminophen, and methocarbamol. We will continue to  administer lactated ringers, iopamidol, triamcinolone acetonide, sodium chloride flush, ropivacaine (PF) 2 mg/mL (0.2%), midazolam, lidocaine (PF), and lactated ringers.   Meds ordered this encounter  Medications  . DISCONTD: HYDROcodone-acetaminophen (NORCO/VICODIN) 5-325 MG tablet    Sig: Take 1 tablet by mouth 3 (three) times daily.    Dispense:  90 tablet    Refill:  0    Do not fill until [id number]  . HYDROcodone-acetaminophen (NORCO/VICODIN) 5-325 MG tablet    Sig: Take 1 tablet by mouth 3 (three) times daily.    Dispense:  90 tablet    Refill:  0    Do not fill until [id number]  . dexamethasone (DECADRON) injection 10 mg  . ropivacaine (PF) 2 mg/mL (0.2%) (NAROPIN) injection 10 mL  . methocarbamol (ROBAXIN-750) 750 MG tablet    Sig: Take 1 tablet (750 mg total) by mouth 4 (four) times daily.    Dispense:  120 tablet    Refill:  5   Patient's Medications  New Prescriptions   No medications on file  Previous Medications   ATORVASTATIN (LIPITOR) 40 MG TABLET    Take 40 mg by mouth daily.   CHOLECALCIFEROL (VITAMIN D3 PO)    Take by mouth daily.   CYANOCOBALAMIN (V-R VITAMIN B-12) 500 MCG TABLET    Take 500 mcg by mouth daily.    DEXTROMETHORPHAN-GUAIFENESIN (MUCINEX DM) 30-600 MG 12HR TABLET    Take 1 tablet by mouth 2 (two) times daily.   DOCUSATE SODIUM (COLACE) 100 MG CAPSULE    Take 1 capsule  (100 mg total) by mouth 2 (two) times daily.   DULOXETINE (CYMBALTA) 60 MG CAPSULE    Take 60 mg by mouth every evening. Patient states she also takes a 30mg  in the morning per patient   FLUTICASONE (FLONASE) 50 MCG/ACT NASAL SPRAY    Place into the nose.   FUROSEMIDE (LASIX) 20 MG TABLET    Take 20 mg by mouth 3 (three) times daily as needed for fluid.   LORATADINE-PSEUDOEPHEDRINE (CLARITIN-D 12-HOUR) 5-120 MG PER TABLET    Take 1 tablet by mouth daily.    METHOCARBAMOL (ROBAXIN) 750 MG TABLET    take 1 tablet by mouth three times a day   METOPROLOL (LOPRESSOR) 50 MG TABLET    Take 50 mg by mouth 2 (two) times daily.    NAPROXEN (NAPROSYN) 500 MG TABLET       OLOPATADINE HCL (PATADAY) 0.2 % SOLN    Place 1 drop into both eyes daily as needed. For dry eyes   POTASSIUM CHLORIDE (K-DUR) 10 MEQ TABLET       TAMSULOSIN (FLOMAX) 0.4 MG CAPS CAPSULE      Modified Medications   Modified Medication Previous Medication   HYDROCODONE-ACETAMINOPHEN (NORCO/VICODIN) 5-325 MG TABLET HYDROcodone-acetaminophen (NORCO/VICODIN) 5-325 MG tablet      Take 1 tablet by mouth 3 (three) times daily.    Take 1 tablet by mouth 3 (three) times daily.   METHOCARBAMOL (ROBAXIN-750) 750 MG TABLET methocarbamol (ROBAXIN-750) 750 MG tablet      Take 1 tablet (750 mg total) by mouth 4 (four) times daily.    Take 1 tablet (750 mg total) by mouth 4 (four) times daily.  Discontinued Medications   No medications on file   ---------------------------------------------------------------------------------------------------------------------- Procedure: Right gluteal trigger point injection #22  Trigger point injection: The area overlying the aforementioned trigger points were prepped with alcohol. They were then injected with a 25-gauge needle with 4 cc of ropivacaine 0.2% and Decadron 4 mg  at each site after negative aspiration for heme. This was performed after informed consent was obtained and risks and benefits reviewed. She  tolerated this procedure without difficulty and was convalesced and discharged to home in stable condition for follow-up as mentioned.   Adyn Serna, MD@ Follow-up: Return for evaluation, procedure.    Yevette Edwards, MD

## 2017-08-18 ENCOUNTER — Ambulatory Visit
Admission: RE | Admit: 2017-08-18 | Discharge: 2017-08-18 | Disposition: A | Discharge status: Home / Self Care | Payer: Medicare Other | Source: Ambulatory Visit | Attending: Anesthesiology | Admitting: Anesthesiology

## 2017-08-18 ENCOUNTER — Ambulatory Visit (HOSPITAL_BASED_OUTPATIENT_CLINIC_OR_DEPARTMENT_OTHER): Payer: Medicare Other | Admitting: Anesthesiology

## 2017-08-18 ENCOUNTER — Other Ambulatory Visit: Payer: Self-pay | Admitting: Anesthesiology

## 2017-08-18 ENCOUNTER — Encounter: Payer: Self-pay | Admitting: Anesthesiology

## 2017-08-18 VITALS — BP 115/67 | HR 76 | Temp 98.8°F | Resp 20 | Ht 59.5 in | Wt 202.0 lb

## 2017-08-18 DIAGNOSIS — G894 Chronic pain syndrome: Secondary | ICD-10-CM | POA: Insufficient documentation

## 2017-08-18 DIAGNOSIS — M5441 Lumbago with sciatica, right side: Secondary | ICD-10-CM

## 2017-08-18 DIAGNOSIS — Z79891 Long term (current) use of opiate analgesic: Secondary | ICD-10-CM | POA: Insufficient documentation

## 2017-08-18 DIAGNOSIS — R52 Pain, unspecified: Secondary | ICD-10-CM

## 2017-08-18 DIAGNOSIS — M5136 Other intervertebral disc degeneration, lumbar region: Secondary | ICD-10-CM

## 2017-08-18 DIAGNOSIS — M79605 Pain in left leg: Secondary | ICD-10-CM | POA: Insufficient documentation

## 2017-08-18 DIAGNOSIS — Z79899 Other long term (current) drug therapy: Secondary | ICD-10-CM | POA: Insufficient documentation

## 2017-08-18 DIAGNOSIS — G8929 Other chronic pain: Secondary | ICD-10-CM

## 2017-08-18 DIAGNOSIS — M5386 Other specified dorsopathies, lumbar region: Secondary | ICD-10-CM

## 2017-08-18 DIAGNOSIS — F1190 Opioid use, unspecified, uncomplicated: Secondary | ICD-10-CM

## 2017-08-18 MED ORDER — LIDOCAINE HCL (PF) 1 % IJ SOLN
5.0000 mL | Freq: Once | INTRAMUSCULAR | Status: AC
  Administered 2017-08-18: 5 mL via SUBCUTANEOUS
  Filled 2017-08-18: qty 5

## 2017-08-18 MED ORDER — TRIAMCINOLONE ACETONIDE 40 MG/ML IJ SUSP
40.0000 mg | Freq: Once | INTRAMUSCULAR | Status: AC
  Administered 2017-08-18: 40 mg
  Filled 2017-08-18: qty 1

## 2017-08-18 MED ORDER — HYDROCODONE-ACETAMINOPHEN 5-325 MG PO TABS: 1.0000 | ORAL_TABLET | Freq: Three times a day (TID) | ORAL | 0 refills | Status: DC

## 2017-08-18 MED ORDER — LACTATED RINGERS IV SOLN: 1000.0000 mL | INTRAVENOUS | Status: DC

## 2017-08-18 MED ORDER — IOPAMIDOL (ISOVUE-M 200) INJECTION 41%
20.0000 mL | Freq: Once | INTRAMUSCULAR | Status: DC | PRN
  Administered 2017-08-18: 10 mL
  Filled 2017-08-18: qty 20

## 2017-08-18 MED ORDER — SODIUM CHLORIDE 0.9% FLUSH
10.0000 mL | Freq: Once | INTRAVENOUS | Status: AC
  Administered 2017-08-18: 10 mL

## 2017-08-18 NOTE — Progress Notes (Signed)
Subjective:  Patient ID: Madison [name], female    DOB: 1950/09/20  Age: 66 y.o. MRN: [id number]  CC: Back Pain (lower back on the right)   Procedure: Caudal epidural steroid No. 1  HPI Madison [name] presents for reevaluation.  She continues to have lower extremity pain similar to what she is experienced in the past which is left side greater than right.  She is experienced this in the past and has responded favorably to a preceding series of epidural steroids early in the year.  These were done in the winter and spring 2018.  She got good relief and this lasted about 7 months.  She does report that back in June she did fall on a 2 x 4 and sustained some pain in the left leg which was evaluated by Dr. Lovell Sheehan in [place].  She has had some pain with straight leg extension of the left leg causing some back pain.  No reported weakness in the lower extremities or problems with bowel or bladder dysfunction are noted.  She is also taking her medications as prescribed with no problems with these.  I reviewed her narcotic assessment sheet and she continues to derive good functional lifestyle improvement with her medications.  Outpatient Medications Prior to Visit  Medication Sig Dispense Refill  . atorvastatin (LIPITOR) 40 MG tablet Take 40 mg by mouth daily.    . Cholecalciferol (VITAMIN D3 PO) Take by mouth daily.    . cyanocobalamin (V-R VITAMIN B-12) 500 MCG tablet Take 500 mcg by mouth daily.     Marland Kitchen dextromethorphan-guaiFENesin (MUCINEX DM) 30-600 MG 12hr tablet Take 1 tablet by mouth 2 (two) times daily.    . furosemide (LASIX) 20 MG tablet Take 20 mg by mouth 3 (three) times daily as needed for fluid.    Marland Kitchen loratadine-pseudoephedrine (CLARITIN-D 12-HOUR) 5-120 MG per tablet Take 1 tablet by mouth daily.     . methocarbamol (ROBAXIN-750) 750 MG tablet Take 1 tablet (750 mg total) by mouth 4 (four) times daily. 120 tablet 5  . metoprolol (LOPRESSOR) 50 MG tablet Take 50 mg by mouth 2 (two)  times daily.     . naproxen (NAPROSYN) 500 MG tablet 500 mg 2 (two) times daily with a meal.     . Olopatadine HCl (PATADAY) 0.2 % SOLN Place 1 drop into both eyes daily as needed. For dry eyes    . potassium chloride (K-DUR) 10 MEQ tablet Take 10 mEq by mouth 2 (two) times daily.     Marland Kitchen HYDROcodone-acetaminophen (NORCO/VICODIN) 5-325 MG tablet Take 1 tablet by mouth 3 (three) times daily. 90 tablet 0  . docusate sodium (COLACE) 100 MG capsule Take 1 capsule (100 mg total) by mouth 2 (two) times daily. (Patient not taking: Reported on 08/18/2017) 60 capsule 0  . DULoxetine (CYMBALTA) 60 MG capsule Take 60 mg by mouth every evening. Patient states she also takes a 30mg  in the morning per patient    . fluticasone (FLONASE) 50 MCG/ACT nasal spray Place into the nose.    . methocarbamol (ROBAXIN) 750 MG tablet take 1 tablet by mouth three times a day (Patient not taking: Reported on 06/18/2017) 90 tablet 5  . tamsulosin (FLOMAX) 0.4 MG CAPS capsule      Facility-Administered Medications Prior to Visit  Medication Dose Route Frequency Provider Last Rate Last Dose  . iopamidol (ISOVUE-M) 41 % intrathecal injection 20 mL  20 mL Other Once PRN Yevette Edwards, MD   20 mL at  09/22/16 1553  . lactated ringers infusion 1,000 mL  1,000 mL Intravenous Continuous Yevette EdwardsAdams, Justa Hatchell G, MD      . lactated ringers infusion 1,000 mL  1,000 mL Intravenous Continuous Yevette EdwardsAdams, Tenelle Andreason G, MD      . lidocaine (PF) (XYLOCAINE) 1 % injection 5 mL  5 mL Subcutaneous Once Yevette EdwardsAdams, [place] Festa G, MD      . midazolam (VERSED) injection 5 mg  5 mg Intravenous Once Yevette EdwardsAdams, Indica Marcott G, MD      . ropivacaine (PF) 2 mg/mL (0.2%) (NAROPIN) injection 10 mL  10 mL Epidural Once Yevette EdwardsAdams, Artavia Jeanlouis G, MD      . sodium chloride flush (NS) 0.9 % injection 10 mL  10 mL Other Once Yevette EdwardsAdams, Mariena Meares G, MD      . triamcinolone acetonide (KENALOG-40) injection 40 mg  40 mg Other Once Yevette EdwardsAdams, Tammi Boulier G, MD        Review of Systems CNS: No sedation or confusion Cardiac: No  angina or palpitations GI: No abdominal pain or constipation  Objective:  BP 115/67   Pulse 76   Temp 98.8 F (37.1 C) (Oral)   Resp 20   Ht 4' 11.5" (1.511 m)   Wt 202 lb (91.6 kg)   SpO2 99%   BMI 40.12 kg/m    BP Readings from Last 3 Encounters:  08/18/17 115/67  06/18/17 130/63  04/23/17 (!) 139/53     Wt Readings from Last 3 Encounters:  08/18/17 202 lb (91.6 kg)  06/18/17 202 lb 8 oz (91.9 kg)  04/23/17 203 lb (92.1 kg)     Physical Exam Pt is alert and oriented PERRL EOMI HEART IS RRR no murmur or rub LCTA no wheezing or rhales MUSCULOSKELETAL reveals a positive straight leg raise on the left.  She is able to straighten the leg on the left side with flexion at the left hip but she has some low back pain with this.  This would be consistent with a positive straight leg raise.  Her muscle tone and bulk is good and strength appears to be well preserved.  Labs  Lab Results  Component Value Date   HGBA1C 6.1 11/22/2013   Lab Results  Component Value Date   CREATININE 0.77 05/27/2016    -------------------------------------------------------------------------------------------------------------------- Lab Results  Component Value Date   WBC 12.2 (H) 05/27/2016   HGB 11.0 (L) 05/27/2016   HCT 35.3 (L) 05/27/2016   PLT 177 05/27/2016   GLUCOSE 115 (H) 05/27/2016   ALT 48 09/27/2014   AST 59 (H) 09/27/2014   NA 137 05/27/2016   K 4.5 05/27/2016   CL 103 05/27/2016   CREATININE 0.77 05/27/2016   BUN 19 05/27/2016   CO2 30 05/27/2016   TSH 0.402 (L) 11/23/2013   INR 0.9 10/26/2013   HGBA1C 6.1 11/22/2013    --------------------------------------------------------------------------------------------------------------------- Dg C-arm 1-60 Min-no Report  Result Date: 08/18/2017 Fluoroscopy was utilized by the requesting physician.  No radiographic interpretation.     Assessment & Plan:   Madison AspCindy was seen today for back pain.  Diagnoses and all  orders for this visit:  Chronic pain syndrome  Chronic, continuous use of opioids  Low back derangement syndrome  Chronic right-sided low back pain with right-sided sciatica -     triamcinolone acetonide (KENALOG-40) injection 40 mg -     sodium chloride flush (NS) 0.9 % injection 10 mL -     lidocaine (PF) (XYLOCAINE) 1 % injection 5 mL -     lactated ringers infusion  1,000 mL -     iopamidol (ISOVUE-M) 41 % intrathecal injection 20 mL  DDD (degenerative disc disease), lumbar -     Lumbar Epidural Injection; Future  Other orders -     Discontinue: HYDROcodone-acetaminophen (NORCO/VICODIN) 5-325 MG tablet; Take 1 tablet by mouth 3 (three) times daily. -     HYDROcodone-acetaminophen (NORCO/VICODIN) 5-325 MG tablet; Take 1 tablet by mouth 3 (three) times daily.        ----------------------------------------------------------------------------------------------------------------------  Problem List Items Addressed This Visit      Unprioritized   DDD (degenerative disc disease), lumbar   Relevant Medications   HYDROcodone-acetaminophen (NORCO/VICODIN) 5-325 MG tablet   triamcinolone acetonide (KENALOG-40) injection 40 mg (Completed)   Other Relevant Orders   Lumbar Epidural Injection    Other Visit Diagnoses    Chronic pain syndrome    -  Primary   Chronic, continuous use of opioids       Low back derangement syndrome       Relevant Medications   HYDROcodone-acetaminophen (NORCO/VICODIN) 5-325 MG tablet   triamcinolone acetonide (KENALOG-40) injection 40 mg (Completed)   Chronic right-sided low back pain with right-sided sciatica       Relevant Medications   HYDROcodone-acetaminophen (NORCO/VICODIN) 5-325 MG tablet   triamcinolone acetonide (KENALOG-40) injection 40 mg (Completed)   sodium chloride flush (NS) 0.9 % injection 10 mL (Completed)   lidocaine (PF) (XYLOCAINE) 1 % injection 5 mL (Completed)   lactated ringers infusion 1,000 mL   iopamidol (ISOVUE-M) 41 %  intrathecal injection 20 mL        ----------------------------------------------------------------------------------------------------------------------  1. Chronic pain syndrome We will refill her medications for December 13 in January 12.  [facility] information and it is appropriate.  2. Chronic, continuous use of opioids As above  3. Low back derangement syndrome As above  4. Chronic right-sided low back pain with right-sided sciatica We will proceed with an epidural steroid injection today.  The risks and benefits of been reviewed with her in full detail and all questions answered.  Return to clinic in 1 month for her second epidural. - triamcinolone acetonide (KENALOG-40) injection 40 mg - sodium chloride flush (NS) 0.9 % injection 10 mL - lidocaine (PF) (XYLOCAINE) 1 % injection 5 mL - lactated ringers infusion 1,000 mL - iopamidol (ISOVUE-M) 41 % intrathecal injection 20 mL  5. DDD (degenerative disc disease), lumbar Above - Lumbar Epidural Injection; Future    ----------------------------------------------------------------------------------------------------------------------  I am having Madison [name] maintain her cyanocobalamin, fluticasone, loratadine-pseudoephedrine, Olopatadine HCl, metoprolol tartrate, atorvastatin, DULoxetine, furosemide, dextromethorphan-guaiFENesin, docusate sodium, naproxen, potassium chloride, tamsulosin, Cholecalciferol (VITAMIN D3 PO), methocarbamol, methocarbamol, and HYDROcodone-acetaminophen. We administered triamcinolone acetonide, sodium chloride flush, lidocaine (PF), and iopamidol. We will continue to administer lactated ringers, iopamidol, triamcinolone acetonide, sodium chloride flush, ropivacaine (PF) 2 mg/mL (0.2%), midazolam, lidocaine (PF), and lactated ringers.   Meds ordered this encounter  Medications  . DISCONTD: HYDROcodone-acetaminophen (NORCO/VICODIN) 5-325 MG tablet     Sig: Take 1 tablet by mouth 3 (three) times daily.    Dispense:  90 tablet    Refill:  0    Do not fill until [id number]  . HYDROcodone-acetaminophen (NORCO/VICODIN) 5-325 MG tablet    Sig: Take 1 tablet by mouth 3 (three) times daily.    Dispense:  90 tablet    Refill:  0    Do not fill until [id number]  . triamcinolone acetonide (KENALOG-40) injection 40 mg  . sodium chloride flush (NS) 0.9 %  injection 10 mL  . lidocaine (PF) (XYLOCAINE) 1 % injection 5 mL  . lactated ringers infusion 1,000 mL  . iopamidol (ISOVUE-M) 41 % intrathecal injection 20 mL     Medication List        Accurate as of 08/18/17  2:26 PM. Always use your most recent med list.          atorvastatin 40 MG tablet Commonly known as:  LIPITOR   dextromethorphan-guaiFENesin 30-600 MG 12hr tablet Commonly known as:  MUCINEX DM   docusate sodium 100 MG capsule Commonly known as:  COLACE Take 1 capsule (100 mg total) by mouth 2 (two) times daily.   DULoxetine 60 MG capsule Commonly known as:  CYMBALTA   fluticasone 50 MCG/ACT nasal spray Commonly known as:  FLONASE   furosemide 20 MG tablet Commonly known as:  LASIX   HYDROcodone-acetaminophen 5-325 MG tablet Commonly known as:  NORCO/VICODIN Take 1 tablet by mouth 3 (three) times daily.   loratadine-pseudoephedrine 5-120 MG tablet Commonly known as:  CLARITIN-D 12-hour   * methocarbamol 750 MG tablet Commonly known as:  ROBAXIN take 1 tablet by mouth three times a day   * methocarbamol 750 MG tablet Commonly known as:  ROBAXIN-750 Take 1 tablet (750 mg total) by mouth 4 (four) times daily.   metoprolol tartrate 50 MG tablet Commonly known as:  LOPRESSOR   naproxen 500 MG tablet Commonly known as:  NAPROSYN   PATADAY 0.2 % Soln Generic drug:  Olopatadine HCl   potassium chloride 10 MEQ tablet Commonly known as:  K-DUR   tamsulosin 0.4 MG Caps capsule Commonly known as:  FLOMAX   V-R VITAMIN B-12 500 MCG tablet Generic drug:   cyanocobalamin   VITAMIN D3 PO      * This list has 2 medication(s) that are the same as other medications prescribed for you. Read the directions carefully, and ask your doctor or other care provider to review them with you.          Where to Get Your Medications    You can get these medications from any pharmacy   Bring a paper prescription for each of these medications  HYDROcodone-acetaminophen 5-325 MG tablet    ----------------------------------------------------------------------------------------------------------------------  Follow-up: Return in about 1 month (around 09/18/2017) for procedure.  Procedure: Caudal epidural steroid No. 1 under fluoroscopic guidance with no sedation  After informed consent was obtained and the risks benefits reviewed patient chose to pursue a caudal epidural steroid injection today. With the patient in the prone position and no IV in place.. Using AP and lateral fluoroscopic guidance I identified the area overlying the sacral hiatus. This area was broadly prepped with DuraPrep 3 and we utilized strict aseptic technique during the procedure. 1% lidocaine was infiltrated 2 cc using a 25-gauge needle overlying the sacral cornu and then using lateral fluoroscopic guidance I advanced an 18-gauge Touhy needle approximately 2 cm through the sacral hiatus. Confirmation was with 2 cc of Isovue yielding good epidural spread and no evidence of IV or subarachnoid uptake. This was followed by an injection of 5 cc of saline mixed with 1 cc of ropivacaine 0.2% and 40 mg of triamcinolone. This was tolerated without difficulty the patient was convalesced and discharged home in stable condition for follow-up as mentioned. Lanora Manis, MD

## 2017-08-18 NOTE — Progress Notes (Signed)
Nursing Pain Medication Assessment:  Safety precautions to be maintained throughout the outpatient stay will include: orient to surroundings, keep bed in low position, maintain call bell within reach at all times, provide assistance with transfer out of bed and ambulation.  Medication Inspection Compliance: Pill count conducted under aseptic conditions, in front of the patient. Neither the pills nor the bottle was removed from the patient's sight at any time. Once count was completed pills were immediately returned to the patient in their original bottle.  Medication: Hydrocodone/APAP Pill/Patch Count: 4 of 90 pills remain Pill/Patch Appearance: Markings consistent with prescribed medication Bottle Appearance: Standard pharmacy container. Clearly labeled. Filled Date: 3611 / 13 / 2018 Last Medication intake:  Today

## 2017-09-17 ENCOUNTER — Ambulatory Visit: Payer: Medicare Other | Attending: Anesthesiology | Admitting: Anesthesiology

## 2017-10-15 ENCOUNTER — Encounter: Payer: Medicare Other | Admitting: Anesthesiology

## 2017-11-05 ENCOUNTER — Ambulatory Visit: Payer: Medicare Other | Admitting: Anesthesiology

## 2017-11-05 ENCOUNTER — Ambulatory Visit
Admission: RE | Admit: 2017-11-05 | Discharge: 2017-11-05 | Disposition: A | Discharge status: Home / Self Care | Payer: Medicare Other | Source: Ambulatory Visit | Attending: Anesthesiology | Admitting: Anesthesiology

## 2017-11-05 ENCOUNTER — Other Ambulatory Visit: Payer: Self-pay

## 2017-11-05 ENCOUNTER — Other Ambulatory Visit: Payer: Self-pay | Admitting: Anesthesiology

## 2017-11-05 ENCOUNTER — Encounter: Payer: Self-pay | Admitting: Anesthesiology

## 2017-11-05 VITALS — BP 132/78 | HR 76 | Temp 98.4°F | Resp 16 | Ht 59.0 in | Wt 200.0 lb

## 2017-11-05 DIAGNOSIS — M47812 Spondylosis without myelopathy or radiculopathy, cervical region: Secondary | ICD-10-CM

## 2017-11-05 DIAGNOSIS — M5441 Lumbago with sciatica, right side: Secondary | ICD-10-CM | POA: Insufficient documentation

## 2017-11-05 DIAGNOSIS — M4692 Unspecified inflammatory spondylopathy, cervical region: Secondary | ICD-10-CM

## 2017-11-05 DIAGNOSIS — G8929 Other chronic pain: Secondary | ICD-10-CM

## 2017-11-05 DIAGNOSIS — Z79891 Long term (current) use of opiate analgesic: Secondary | ICD-10-CM | POA: Insufficient documentation

## 2017-11-05 DIAGNOSIS — F1190 Opioid use, unspecified, uncomplicated: Secondary | ICD-10-CM

## 2017-11-05 DIAGNOSIS — R52 Pain, unspecified: Secondary | ICD-10-CM

## 2017-11-05 DIAGNOSIS — Z7951 Long term (current) use of inhaled steroids: Secondary | ICD-10-CM | POA: Insufficient documentation

## 2017-11-05 DIAGNOSIS — G894 Chronic pain syndrome: Secondary | ICD-10-CM

## 2017-11-05 DIAGNOSIS — Z79899 Other long term (current) drug therapy: Secondary | ICD-10-CM | POA: Insufficient documentation

## 2017-11-05 DIAGNOSIS — M5386 Other specified dorsopathies, lumbar region: Secondary | ICD-10-CM

## 2017-11-05 MED ORDER — LIDOCAINE HCL (PF) 1 % IJ SOLN
5.0000 mL | Freq: Once | INTRAMUSCULAR | Status: AC
  Administered 2017-11-05: 5 mL via SUBCUTANEOUS
  Filled 2017-11-05: qty 5

## 2017-11-05 MED ORDER — IOPAMIDOL (ISOVUE-M 200) INJECTION 41%
20.0000 mL | Freq: Once | INTRAMUSCULAR | Status: DC | PRN
  Administered 2017-11-05: 10 mL
  Filled 2017-11-05: qty 20

## 2017-11-05 MED ORDER — TRIAMCINOLONE ACETONIDE 40 MG/ML IJ SUSP
40.0000 mg | Freq: Once | INTRAMUSCULAR | Status: AC
  Administered 2017-11-05: 40 mg
  Filled 2017-11-05: qty 1

## 2017-11-05 MED ORDER — IOPAMIDOL (ISOVUE-M 200) INJECTION 41%
INTRAMUSCULAR | Status: AC
  Filled 2017-11-05: qty 10

## 2017-11-05 MED ORDER — HYDROCODONE-ACETAMINOPHEN 5-325 MG PO TABS: 1.0000 | ORAL_TABLET | Freq: Three times a day (TID) | ORAL | 0 refills | Status: DC

## 2017-11-05 MED ORDER — SODIUM CHLORIDE 0.9% FLUSH
10.0000 mL | Freq: Once | INTRAVENOUS | Status: AC
  Administered 2017-11-05: 1 mL

## 2017-11-05 MED ORDER — SODIUM CHLORIDE 0.9 % IJ SOLN
INTRAMUSCULAR | Status: AC
  Filled 2017-11-05: qty 10

## 2017-11-05 MED ORDER — ROPIVACAINE HCL 2 MG/ML IJ SOLN
10.0000 mL | Freq: Once | INTRAMUSCULAR | Status: AC
  Administered 2017-11-05: 1 mL via EPIDURAL
  Filled 2017-11-05: qty 10

## 2017-11-05 NOTE — Patient Instructions (Addendum)
You were given 2 prescriptions for Hydrocodone today. Pain Management Discharge Instructions  General Discharge Instructions :  If you need to reach your doctor call: Monday-Friday 8:00 am - 4:00 pm at [phone] or toll free [phone].  After clinic hours [phone] to have operator reach doctor.  Bring all of your medication bottles to all your appointments in the pain clinic.  To cancel or reschedule your appointment with Pain Management please remember to call 24 hours in advance to avoid a fee.  Refer to the educational materials which you have been given on: General Risks, I had my Procedure. Discharge Instructions, Post Sedation.  Post Procedure Instructions:  The drugs you were given will stay in your system until tomorrow, so for the next 24 hours you should not drive, make any legal decisions or drink any alcoholic beverages.  You may eat anything you prefer, but it is better to start with liquids then soups and crackers, and gradually work up to solid foods.  Please notify your doctor immediately if you have any unusual bleeding, trouble breathing or pain that is not related to your normal pain.  Depending on the type of procedure that was done, some parts of your body may feel week and/or numb.  This usually clears up by tonight or the next day.  Walk with the use of an assistive device or accompanied by an adult for the 24 hours.  You may use ice on the affected area for the first 24 hours.  Put ice in a Ziploc bag and cover with a towel and place against area 15 minutes on 15 minutes off.  You may switch to heat after 24 hours. Epidural Steroid Injection An epidural steroid injection is a shot of steroid medicine and numbing medicine that is given into the space between the spinal cord and the bones in your back (epidural space). The shot helps relieve pain caused by an irritated or swollen nerve root. The amount of pain relief you get from the injection depends on  what is causing the nerve to be swollen and irritated, and how long your pain lasts. You are more likely to benefit from this injection if your pain is strong and comes on suddenly rather than if you have had pain for a long time. Tell a health care provider about:  Any allergies you have.  All medicines you are taking, including vitamins, herbs, eye drops, creams, and over-the-counter medicines.  Any problems you or family members have had with anesthetic medicines.  Any blood disorders you have.  Any surgeries you have had.  Any medical conditions you have.  Whether you are pregnant or may be pregnant. What are the risks? Generally, this is a safe procedure. However, problems may occur, including:  Headache.  Bleeding.  Infection.  Allergic reaction to medicines.  Damage to your nerves.  What happens before the procedure? Staying hydrated Follow instructions from your health care provider about hydration, which may include:  Up to 2 hours before the procedure - you may continue to drink clear liquids, such as water, clear fruit juice, black coffee, and plain tea.  Eating and drinking restrictions Follow instructions from your health care provider about eating and drinking, which may include:  8 hours before the procedure - stop eating heavy meals or foods such as meat, fried foods, or fatty foods.  6 hours before the procedure - stop eating light meals or foods, such as toast or cereal.  6 hours before the procedure -  stop drinking milk or drinks that contain milk.  2 hours before the procedure - stop drinking clear liquids.  Medicine  You may be given medicines to lower anxiety.  Ask your health care provider about: ? Changing or stopping your regular medicines. This is especially important if you are taking diabetes medicines or blood thinners. ? Taking medicines such as aspirin and ibuprofen. These medicines can thin your blood. Do not take these medicines  before your procedure if your health care provider instructs you not to. General instructions  Plan to have someone take you home from the hospital or clinic. What happens during the procedure?  You may receive a medicine to help you relax (sedative).  You will be asked to lie on your abdomen.  The injection site will be cleaned.  A numbing medicine (local anesthetic) will be used to numb the injection site.  A needle will be inserted through your skin into the epidural space. You may feel some discomfort when this happens. An X-ray machine will be used to make sure the needle is put as close as possible to the affected nerve.  A steroid medicine and a local anesthetic will be injected into the epidural space.  The needle will be removed.  A bandage (dressing) will be put over the injection site. What happens after the procedure?  Your blood pressure, heart rate, breathing rate, and blood oxygen level will be monitored until the medicines you were given have worn off.  Your arm or leg may feel weak or numb for a few hours.  The injection site may feel sore.  Do not drive for 24 hours if you received a sedative. This information is not intended to replace advice given to you by your health care provider. Make sure you discuss any questions you have with your health care provider. Document Released: 12/02/2007 Document Revised: 02/06/2016 Document Reviewed: 12/11/2015 Elsevier Interactive Patient Education  [employer]2018 [employer]

## 2017-11-05 NOTE — Progress Notes (Signed)
Nursing Pain Medication Assessment:  Safety precautions to be maintained throughout the outpatient stay will include: orient to surroundings, keep bed in low position, maintain call bell within reach at all times, provide assistance with transfer out of bed and ambulation.  Medication Inspection Compliance: Pill count conducted under aseptic conditions, in front of the patient. Neither the pills nor the bottle was removed from the patient's sight at any time. Once count was completed pills were immediately returned to the patient in their original bottle.  Medication: Hydrocodone/APAP Pill/Patch Count: 0 of 90 pills remain Pill/Patch Appearance: Markings consistent with prescribed medication Bottle Appearance: Standard pharmacy container. Clearly labeled. Filled Date: 01/11 / 2019 Last Medication intake:  Today

## 2017-11-06 ENCOUNTER — Telehealth: Payer: Self-pay

## 2017-11-06 NOTE — Telephone Encounter (Signed)
Post procedure phone call.  Patient states she is doing well.  

## 2017-11-06 NOTE — Progress Notes (Signed)
Subjective:  Patient ID: Madison [name], female    DOB: Jan 12, 1951  Age: 67 y.o. MRN: [id number]  CC: Back Pain (right, lower)   Procedure: Caudal epidural steroid under fluoroscopic guidance without sedation  HPI Madison [name] presents for reevaluation.  She was last seen in December and had her first caudal in this series for her chronic low back pain and posterior lateral leg pain.  She did very well with her injection getting approximately 75% relief lasting 6 weeks.  She has had some gradual recurrence of the same quality and characteristic pain that she is previously experienced.  Unfortunately she is failed conservative therapy and status post previous lumbar fusion.  She is medication management as well for relief taking Norco 3 times a day.  She denies any change in her lower extremity strength or function and her bowel bladder function is been stable as well.  Otherwise she is been in her usual state of health.  Based on her narcotic assessment sheet she continues to derive good functional lifestyle improvement with her chronic opioid therapy.  Outpatient Medications Prior to Visit  Medication Sig Dispense Refill  . atorvastatin (LIPITOR) 40 MG tablet Take 40 mg by mouth daily.    . Cholecalciferol (VITAMIN D3 PO) Take by mouth daily.    . cyanocobalamin (V-R VITAMIN B-12) 500 MCG tablet Take 500 mcg by mouth daily.     Marland Kitchen dextromethorphan-guaiFENesin (MUCINEX DM) 30-600 MG 12hr tablet Take 1 tablet by mouth 2 (two) times daily.    . furosemide (LASIX) 20 MG tablet Take 20 mg by mouth 3 (three) times daily as needed for fluid.    Marland Kitchen loratadine-pseudoephedrine (CLARITIN-D 12-HOUR) 5-120 MG per tablet Take 1 tablet by mouth daily.     . methocarbamol (ROBAXIN-750) 750 MG tablet Take 1 tablet (750 mg total) by mouth 4 (four) times daily. 120 tablet 5  . metoprolol (LOPRESSOR) 50 MG tablet Take 50 mg by mouth 2 (two) times daily.     . naproxen (NAPROSYN) 500 MG tablet 500 mg 2  (two) times daily with a meal.     . Olopatadine HCl (PATADAY) 0.2 % SOLN Place 1 drop into both eyes daily as needed. For dry eyes    . potassium chloride (K-DUR) 10 MEQ tablet Take 10 mEq by mouth 2 (two) times daily.     Marland Kitchen HYDROcodone-acetaminophen (NORCO/VICODIN) 5-325 MG tablet Take 1 tablet by mouth 3 (three) times daily. 90 tablet 0  . docusate sodium (COLACE) 100 MG capsule Take 1 capsule (100 mg total) by mouth 2 (two) times daily. (Patient not taking: Reported on 08/18/2017) 60 capsule 0  . DULoxetine (CYMBALTA) 60 MG capsule Take 60 mg by mouth every evening. Patient states she also takes a 30mg  in the morning per patient    . fluticasone (FLONASE) 50 MCG/ACT nasal spray Place into the nose.    . methocarbamol (ROBAXIN) 750 MG tablet take 1 tablet by mouth three times a day (Patient not taking: Reported on 11/05/2017) 90 tablet 5  . tamsulosin (FLOMAX) 0.4 MG CAPS capsule      Facility-Administered Medications Prior to Visit  Medication Dose Route Frequency Provider Last Rate Last Dose  . iopamidol (ISOVUE-M) 41 % intrathecal injection 20 mL  20 mL Other Once PRN Yevette Edwards, MD   20 mL at 09/22/16 1553  . lactated ringers infusion 1,000 mL  1,000 mL Intravenous Continuous Yevette Edwards, MD      . lactated ringers infusion  1,000 mL  1,000 mL Intravenous Continuous Yevette Edwards, MD      . lidocaine (PF) (XYLOCAINE) 1 % injection 5 mL  5 mL Subcutaneous Once Yevette Edwards, MD      . midazolam (VERSED) injection 5 mg  5 mg Intravenous Once Yevette Edwards, MD      . ropivacaine (PF) 2 mg/mL (0.2%) (NAROPIN) injection 10 mL  10 mL Epidural Once Yevette Edwards, MD      . sodium chloride flush (NS) 0.9 % injection 10 mL  10 mL Other Once Yevette Edwards, MD      . triamcinolone acetonide (KENALOG-40) injection 40 mg  40 mg Other Once Yevette Edwards, MD        Review of Systems CNS: No confusion or sedation Cardiac: No angina or palpitations GI: No abdominal pain or  constipation Constitutional: No nausea vomiting fevers or chills  Objective:  BP 132/78   Pulse 76   Temp 98.4 F (36.9 C) (Oral)   Resp 16   Ht 4\' 11"  (1.499 m)   Wt 200 lb (90.7 kg)   SpO2 99%   BMI 40.40 kg/m    BP Readings from Last 3 Encounters:  11/05/17 132/78  08/18/17 115/67  06/18/17 130/63     Wt Readings from Last 3 Encounters:  11/05/17 200 lb (90.7 kg)  08/18/17 202 lb (91.6 kg)  06/18/17 202 lb 8 oz (91.9 kg)     Physical Exam Pt is alert and oriented PERRL EOMI HEART IS RRR no murmur or rub LCTA no wheezing or rales MUSCULOSKELETAL reveals some paraspinous muscle tenderness but no overt trigger points.  She continues to have a positive straight leg raise on the right side negative on the left.  She has some paraspinous muscle tenderness in the lumbar region but no overt trigger points.  Her muscle tone and bulk is at baseline  Labs  Lab Results  Component Value Date   HGBA1C 6.1 11/22/2013   Lab Results  Component Value Date   CREATININE 0.77 05/27/2016    -------------------------------------------------------------------------------------------------------------------- Lab Results  Component Value Date   WBC 12.2 (H) 05/27/2016   HGB 11.0 (L) 05/27/2016   HCT 35.3 (L) 05/27/2016   PLT 177 05/27/2016   GLUCOSE 115 (H) 05/27/2016   ALT 48 09/27/2014   AST 59 (H) 09/27/2014   NA 137 05/27/2016   K 4.5 05/27/2016   CL 103 05/27/2016   CREATININE 0.77 05/27/2016   BUN 19 05/27/2016   CO2 30 05/27/2016   TSH 0.402 (L) 11/23/2013   INR 0.9 10/26/2013   HGBA1C 6.1 11/22/2013    --------------------------------------------------------------------------------------------------------------------- Dg C-arm 1-60 Min-no Report  Result Date: 11/05/2017 Fluoroscopy was utilized by the requesting physician.  No radiographic interpretation.     Assessment & Plan:   Madison [name] was seen today for back pain.  Diagnoses and all orders for this  visit:  Chronic pain syndrome  Chronic, continuous use of opioids  Low back derangement syndrome  Chronic right-sided low back pain with right-sided sciatica -     Lumbar Epidural Injection; Future  Facet arthritis of cervical region [facility])  Other orders -     Discontinue: HYDROcodone-acetaminophen (NORCO/VICODIN) 5-325 MG tablet; Take 1 tablet by mouth 3 (three) times daily. -     HYDROcodone-acetaminophen (NORCO/VICODIN) 5-325 MG tablet; Take 1 tablet by mouth 3 (three) times daily. -     triamcinolone acetonide (KENALOG-40) injection 40 mg -     sodium chloride  flush (NS) 0.9 % injection 10 mL -     ropivacaine (PF) 2 mg/mL (0.2%) (NAROPIN) injection 10 mL -     lidocaine (PF) (XYLOCAINE) 1 % injection 5 mL -     iopamidol (ISOVUE-M) 41 % intrathecal injection 20 mL        ----------------------------------------------------------------------------------------------------------------------  Problem List Items Addressed This Visit      Unprioritized   Facet arthritis of cervical region [facility])   Relevant Medications   HYDROcodone-acetaminophen (NORCO/VICODIN) 5-325 MG tablet   triamcinolone acetonide (KENALOG-40) injection 40 mg (Completed)    Other Visit Diagnoses    Chronic pain syndrome    -  Primary   Chronic, continuous use of opioids       Low back derangement syndrome       Relevant Medications   HYDROcodone-acetaminophen (NORCO/VICODIN) 5-325 MG tablet   triamcinolone acetonide (KENALOG-40) injection 40 mg (Completed)   Chronic right-sided low back pain with right-sided sciatica       Relevant Medications   HYDROcodone-acetaminophen (NORCO/VICODIN) 5-325 MG tablet   triamcinolone acetonide (KENALOG-40) injection 40 mg (Completed)   Other Relevant Orders   Lumbar Epidural Injection        ----------------------------------------------------------------------------------------------------------------------  1. Chronic pain syndrome Continue efforts at  weight loss with stretching strengthening as once again reviewed today  2. Chronic, continuous use of opioids We reviewed the [facility] practitioner database information and it is appropriate.  We will give her refills for February 28 and March 30 on her Norco.  We will have her return to clinic in 2 months for reevaluation  3. Low back derangement syndrome As above  4. Chronic right-sided low back pain with right-sided sciatica Secondary to the chronic right lower extremity sciatica and low back pain with degenerative disc disease we will proceed with a caudal epidural steroid injection today.  The risks and benefits of been once again reviewed and all questions answered.  She is to return to clinic in 2 months for possible repeat injection for the third in her series. - Lumbar Epidural Injection; Future  5. Facet arthritis of cervical region [facility]) Continue orthotic pillow and stretching strengthening exercises    ----------------------------------------------------------------------------------------------------------------------  I am having Madison [name] maintain her cyanocobalamin, fluticasone, loratadine-pseudoephedrine, Olopatadine HCl, metoprolol tartrate, atorvastatin, DULoxetine, furosemide, dextromethorphan-guaiFENesin, docusate sodium, naproxen, potassium chloride, tamsulosin, Cholecalciferol (VITAMIN D3 PO), methocarbamol, methocarbamol, and HYDROcodone-acetaminophen. We administered triamcinolone acetonide, sodium chloride flush, ropivacaine (PF) 2 mg/mL (0.2%), lidocaine (PF), and iopamidol. We will continue to administer lactated ringers, iopamidol, triamcinolone acetonide, sodium chloride flush, ropivacaine (PF) 2 mg/mL (0.2%), midazolam, lidocaine (PF), and lactated ringers.   Meds ordered this encounter  Medications  . DISCONTD: HYDROcodone-acetaminophen (NORCO/VICODIN) 5-325 MG tablet    Sig: Take 1 tablet by mouth 3 (three) times daily.    Dispense:  90 tablet     Refill:  0    Do not fill until [id number]  . HYDROcodone-acetaminophen (NORCO/VICODIN) 5-325 MG tablet    Sig: Take 1 tablet by mouth 3 (three) times daily.    Dispense:  90 tablet    Refill:  0    Do not fill until [id number]  . triamcinolone acetonide (KENALOG-40) injection 40 mg  . sodium chloride flush (NS) 0.9 % injection 10 mL  . ropivacaine (PF) 2 mg/mL (0.2%) (NAROPIN) injection 10 mL  . lidocaine (PF) (XYLOCAINE) 1 % injection 5 mL  . iopamidol (ISOVUE-M) 41 % intrathecal injection 20 mL   Patient's Medications  New Prescriptions  No medications on file  Previous Medications   ATORVASTATIN (LIPITOR) 40 MG TABLET    Take 40 mg by mouth daily.   CHOLECALCIFEROL (VITAMIN D3 PO)    Take by mouth daily.   CYANOCOBALAMIN (V-R VITAMIN B-12) 500 MCG TABLET    Take 500 mcg by mouth daily.    DEXTROMETHORPHAN-GUAIFENESIN (MUCINEX DM) 30-600 MG 12HR TABLET    Take 1 tablet by mouth 2 (two) times daily.   DOCUSATE SODIUM (COLACE) 100 MG CAPSULE    Take 1 capsule (100 mg total) by mouth 2 (two) times daily.   DULOXETINE (CYMBALTA) 60 MG CAPSULE    Take 60 mg by mouth every evening. Patient states she also takes a 30mg  in the morning per patient   FLUTICASONE (FLONASE) 50 MCG/ACT NASAL SPRAY    Place into the nose.   FUROSEMIDE (LASIX) 20 MG TABLET    Take 20 mg by mouth 3 (three) times daily as needed for fluid.   LORATADINE-PSEUDOEPHEDRINE (CLARITIN-D 12-HOUR) 5-120 MG PER TABLET    Take 1 tablet by mouth daily.    METHOCARBAMOL (ROBAXIN) 750 MG TABLET    take 1 tablet by mouth three times a day   METHOCARBAMOL (ROBAXIN-750) 750 MG TABLET    Take 1 tablet (750 mg total) by mouth 4 (four) times daily.   METOPROLOL (LOPRESSOR) 50 MG TABLET    Take 50 mg by mouth 2 (two) times daily.    NAPROXEN (NAPROSYN) 500 MG TABLET    500 mg 2 (two) times daily with a meal.    OLOPATADINE HCL (PATADAY) 0.2 % SOLN    Place 1 drop into both eyes daily as needed. For dry eyes   POTASSIUM CHLORIDE (K-DUR)  10 MEQ TABLET    Take 10 mEq by mouth 2 (two) times daily.    TAMSULOSIN (FLOMAX) 0.4 MG CAPS CAPSULE      Modified Medications   Modified Medication Previous Medication   HYDROCODONE-ACETAMINOPHEN (NORCO/VICODIN) 5-325 MG TABLET HYDROcodone-acetaminophen (NORCO/VICODIN) 5-325 MG tablet      Take 1 tablet by mouth 3 (three) times daily.    Take 1 tablet by mouth 3 (three) times daily.  Discontinued Medications   No medications on file   ----------------------------------------------------------------------------------------------------------------------  Follow-up: Return in about 2 months (around 01/03/2018) for evaluation, procedure.  Procedure: Caudal epidural steroid No. 2 under fluoroscopic guidance without sedation.  After informed consent was obtained and the risks benefits reviewed patient chose to pursue a caudal epidural steroid injection today. With the patient in the prone position . Using AP and lateral fluoroscopic guidance I identified the area overlying the sacral hiatus. This area was broadly prepped with DuraPrep 3 and we utilized strict aseptic technique during the procedure. 1% lidocaine was infiltrated 2 cc using a 25-gauge needle overlying the sacral cornu and then using lateral fluoroscopic guidance I advanced an 18-gauge Touhy needle approximately 2 cm through the sacral hiatus. Confirmation was with 2 cc of Isovue yielding good epidural spread and no evidence of IV or subarachnoid uptake. This was followed by an injection of 5 cc of saline mixed with 1 cc of ropivacaine 0.2% and 40 mg of triamcinolone. This was tolerated without difficulty the patient was convalesced and discharged home in stable condition for follow-up as mentioned. Lanora ManisJA  Dezi Brauner G Yazmen Briones, MD

## 2017-12-31 ENCOUNTER — Ambulatory Visit: Payer: Medicare Other | Admitting: Anesthesiology

## 2018-01-18 ENCOUNTER — Ambulatory Visit
Admission: RE | Admit: 2018-01-18 | Discharge: 2018-01-18 | Disposition: A | Discharge status: Home / Self Care | Payer: Self-pay | Source: Ambulatory Visit | Attending: Anesthesiology | Admitting: Anesthesiology

## 2018-01-18 ENCOUNTER — Other Ambulatory Visit: Payer: Self-pay | Admitting: Anesthesiology

## 2018-01-18 ENCOUNTER — Encounter: Payer: Self-pay | Admitting: Anesthesiology

## 2018-01-18 ENCOUNTER — Other Ambulatory Visit: Payer: Self-pay

## 2018-01-18 ENCOUNTER — Ambulatory Visit: Payer: Self-pay | Admitting: Anesthesiology

## 2018-01-18 VITALS — BP 155/83 | HR 68 | Temp 98.6°F | Resp 22 | Ht 59.0 in | Wt 202.0 lb

## 2018-01-18 DIAGNOSIS — M5386 Other specified dorsopathies, lumbar region: Secondary | ICD-10-CM

## 2018-01-18 DIAGNOSIS — M542 Cervicalgia: Secondary | ICD-10-CM

## 2018-01-18 DIAGNOSIS — Z79899 Other long term (current) drug therapy: Secondary | ICD-10-CM | POA: Insufficient documentation

## 2018-01-18 DIAGNOSIS — G8929 Other chronic pain: Secondary | ICD-10-CM

## 2018-01-18 DIAGNOSIS — M1388 Other specified arthritis, other site: Secondary | ICD-10-CM | POA: Insufficient documentation

## 2018-01-18 DIAGNOSIS — R52 Pain, unspecified: Secondary | ICD-10-CM

## 2018-01-18 DIAGNOSIS — M5136 Other intervertebral disc degeneration, lumbar region: Secondary | ICD-10-CM | POA: Insufficient documentation

## 2018-01-18 DIAGNOSIS — M5441 Lumbago with sciatica, right side: Secondary | ICD-10-CM

## 2018-01-18 DIAGNOSIS — F1190 Opioid use, unspecified, uncomplicated: Secondary | ICD-10-CM

## 2018-01-18 DIAGNOSIS — M5387 Other specified dorsopathies, lumbosacral region: Secondary | ICD-10-CM | POA: Insufficient documentation

## 2018-01-18 DIAGNOSIS — G894 Chronic pain syndrome: Secondary | ICD-10-CM | POA: Insufficient documentation

## 2018-01-18 DIAGNOSIS — Z7951 Long term (current) use of inhaled steroids: Secondary | ICD-10-CM | POA: Insufficient documentation

## 2018-01-18 DIAGNOSIS — M47812 Spondylosis without myelopathy or radiculopathy, cervical region: Secondary | ICD-10-CM

## 2018-01-18 DIAGNOSIS — Z79891 Long term (current) use of opiate analgesic: Secondary | ICD-10-CM | POA: Insufficient documentation

## 2018-01-18 DIAGNOSIS — Z760 Encounter for issue of repeat prescription: Secondary | ICD-10-CM | POA: Insufficient documentation

## 2018-01-18 MED ORDER — LIDOCAINE HCL (PF) 1 % IJ SOLN
INTRAMUSCULAR | Status: AC
  Filled 2018-01-18: qty 5

## 2018-01-18 MED ORDER — SODIUM CHLORIDE 0.9 % IJ SOLN
INTRAMUSCULAR | Status: AC
  Filled 2018-01-18: qty 10

## 2018-01-18 MED ORDER — IOPAMIDOL (ISOVUE-M 200) INJECTION 41%
INTRAMUSCULAR | Status: AC
  Filled 2018-01-18: qty 10

## 2018-01-18 MED ORDER — IOPAMIDOL (ISOVUE-M 200) INJECTION 41%
20.0000 mL | Freq: Once | INTRAMUSCULAR | Status: DC | PRN
  Administered 2018-01-18: 10 mL
  Filled 2018-01-18: qty 20

## 2018-01-18 MED ORDER — ROPIVACAINE HCL 2 MG/ML IJ SOLN
INTRAMUSCULAR | Status: AC
  Filled 2018-01-18: qty 10

## 2018-01-18 MED ORDER — TRIAMCINOLONE ACETONIDE 40 MG/ML IJ SUSP
INTRAMUSCULAR | Status: AC
  Filled 2018-01-18: qty 1

## 2018-01-18 MED ORDER — ROPIVACAINE HCL 2 MG/ML IJ SOLN
10.0000 mL | Freq: Once | INTRAMUSCULAR | Status: AC
  Administered 2018-01-18: 10 mL via EPIDURAL

## 2018-01-18 MED ORDER — TRIAMCINOLONE ACETONIDE 40 MG/ML IJ SUSP
40.0000 mg | Freq: Once | INTRAMUSCULAR | Status: AC
  Administered 2018-01-18: 40 mg

## 2018-01-18 MED ORDER — HYDROCODONE-ACETAMINOPHEN 5-325 MG PO TABS: 1.0000 | ORAL_TABLET | Freq: Three times a day (TID) | ORAL | 0 refills | Status: DC

## 2018-01-18 MED ORDER — SODIUM CHLORIDE 0.9% FLUSH
10.0000 mL | Freq: Once | INTRAVENOUS | Status: AC
  Administered 2018-01-18: 10 mL

## 2018-01-18 MED ORDER — LIDOCAINE HCL (PF) 1 % IJ SOLN
5.0000 mL | Freq: Once | INTRAMUSCULAR | Status: AC
  Administered 2018-01-18: 5 mL via SUBCUTANEOUS

## 2018-01-18 NOTE — Progress Notes (Signed)
Nursing Pain Medication Assessment:  Safety precautions to be maintained throughout the outpatient stay will include: orient to surroundings, keep bed in low position, maintain call bell within reach at all times, provide assistance with transfer out of bed and ambulation.  Medication Inspection Compliance: Pill count conducted under aseptic conditions, in front of the patient. Neither the pills nor the bottle was removed from the patient's sight at any time. Once count was completed pills were immediately returned to the patient in their original bottle.  Medication: Hydrocodone/APAP Pill/Patch Count: 0 of 90 pills remain Pill/Patch Appearance: Markings consistent with prescribed medication Bottle Appearance: Standard pharmacy container. Clearly labeled. Filled Date: 03 / 29 / 2019 Last Medication intake:  01/06/2018

## 2018-01-18 NOTE — Progress Notes (Signed)
Subjective:  Patient ID: Madison [name], female    DOB: 1951/05/31  Age: 67 y.o. MRN: [id number]  CC: Procedure (Caudal epidural ); Back Pain; and Medication Refill (Hydrocodone )   Procedure: Caudal epidural steroid No. 1  HPI Madison [name] presents for caudal epidural.  She was last seen in February.  She states that she did very well with her last injection.  She had approximately 70% relief of her low back pain and posterior and right leg pain.  She is got some recurrence of right anterior thigh pain and this is of the same quality characteristic distribution as previously documented.  No significant changes are noted in her lower extremity strength or function but she does respond favorably to the epidural injections.  This pain did not get better following her most recent decompression 2 years ago.  Unfortunately the pain has remained quite recalcitrant.  She generally experiences approximately 70 to 75% relief generally lasting a few months and this enables her to function better use less medication and sleep better at night.  Conservative management with exercising and opioids alone has been insufficient to keep her pain under good control.  She reports today requesting a repeat epidural for her standard pain symptom complex.  She has been taking her medications as prescribed and based on her narcotic assessment sheet she continues to derive good functional lifestyle improvement with her Norco and reports no side effects.  Outpatient Medications Prior to Visit  Medication Sig Dispense Refill  . atorvastatin (LIPITOR) 40 MG tablet Take 40 mg by mouth daily.    . Cholecalciferol (VITAMIN D3 PO) Take by mouth daily.    . cyanocobalamin (V-R VITAMIN B-12) 500 MCG tablet Take 500 mcg by mouth daily.     Marland Kitchen dextromethorphan-guaiFENesin (MUCINEX DM) 30-600 MG 12hr tablet Take 1 tablet by mouth 2 (two) times daily.    . fluticasone (FLONASE) 50 MCG/ACT nasal spray Place into the nose.    .  furosemide (LASIX) 20 MG tablet Take 20 mg by mouth 3 (three) times daily as needed for fluid.    Marland Kitchen loratadine-pseudoephedrine (CLARITIN-D 12-HOUR) 5-120 MG per tablet Take 1 tablet by mouth daily.     . methocarbamol (ROBAXIN) 750 MG tablet take 1 tablet by mouth three times a day 90 tablet 5  . methocarbamol (ROBAXIN-750) 750 MG tablet Take 1 tablet (750 mg total) by mouth 4 (four) times daily. 120 tablet 5  . metoprolol (LOPRESSOR) 50 MG tablet Take 50 mg by mouth 2 (two) times daily.     . naproxen (NAPROSYN) 500 MG tablet 500 mg 2 (two) times daily with a meal.     . Olopatadine HCl (PATADAY) 0.2 % SOLN Place 1 drop into both eyes daily as needed. For dry eyes    . potassium chloride (K-DUR) 10 MEQ tablet Take 10 mEq by mouth 2 (two) times daily.     Marland Kitchen HYDROcodone-acetaminophen (NORCO/VICODIN) 5-325 MG tablet Take 1 tablet by mouth 3 (three) times daily. 90 tablet 0  . docusate sodium (COLACE) 100 MG capsule Take 1 capsule (100 mg total) by mouth 2 (two) times daily. (Patient not taking: Reported on 08/18/2017) 60 capsule 0  . DULoxetine (CYMBALTA) 60 MG capsule Take 60 mg by mouth every evening. Patient states she also takes a  in the morning per patient    . tamsulosin (FLOMAX) 0.4 MG CAPS capsule      Facility-Administered Medications Prior to Visit  Medication Dose Route Frequency Provider Last  Rate Last Dose  . iopamidol (ISOVUE-M) 41 % intrathecal injection 20 mL  20 mL Other Once PRN Yevette Edwards, MD   20 mL at 09/22/16 1553  . lactated ringers infusion 1,000 mL  1,000 mL Intravenous Continuous Yevette Edwards, MD      . lactated ringers infusion 1,000 mL  1,000 mL Intravenous Continuous Yevette Edwards, MD      . lidocaine (PF) (XYLOCAINE) 1 % injection 5 mL  5 mL Subcutaneous Once Yevette Edwards, MD      . midazolam (VERSED) injection 5 mg  5 mg Intravenous Once Yevette Edwards, MD      . ropivacaine (PF) 2 mg/mL (0.2%) (NAROPIN) injection 10 mL  10 mL Epidural Once Yevette Edwards, MD      . sodium chloride flush (NS) 0.9 % injection 10 mL  10 mL Other Once Yevette Edwards, MD      . triamcinolone acetonide (KENALOG-40) injection 40 mg  40 mg Other Once Yevette Edwards, MD        Review of Systems CNS: No confusion or sedation Cardiac: No angina or palpitations GI: No abdominal pain or constipation Constitutional: No nausea vomiting fevers or chills  Objective:  BP (!) 155/83   Pulse 68   Temp 98.6 F (37 C)   Resp (!) 22   Ht  (1.499 m)   Wt 202 lb (91.6 kg)   SpO2 98%   BMI 40.80 kg/m    BP Readings from Last 3 Encounters:  01/18/18 (!) 155/83  11/05/17 132/78  08/18/17 115/67     Wt Readings from Last 3 Encounters:  01/18/18 202 lb (91.6 kg)  11/05/17 200 lb (90.7 kg)  08/18/17 202 lb (91.6 kg)     Physical Exam Pt is alert and oriented PERRL EOMI HEART IS RRR no murmur or rub LCTA no wheezing or rales MUSCULOSKELETAL reveals some paraspinous muscle tenderness in the lumbar spine and a well-healed midline scar but no change in lower extremity strength or function or muscle tone or bulk.  Labs  Lab Results  Component Value Date   HGBA1C 6.1 11/22/2013   Lab Results  Component Value Date   CREATININE 0.77 05/27/2016    -------------------------------------------------------------------------------------------------------------------- Lab Results  Component Value Date   WBC 12.2 (H) 05/27/2016   HGB 11.0 (L) 05/27/2016   HCT 35.3 (L) 05/27/2016   PLT 177 05/27/2016   GLUCOSE 115 (H) 05/27/2016   ALT 48 09/27/2014   AST 59 (H) 09/27/2014   NA 137 05/27/2016   K 4.5 05/27/2016   CL 103 05/27/2016   CREATININE 0.77 05/27/2016   BUN 19 05/27/2016   CO2 30 05/27/2016   TSH 0.402 (L) 11/23/2013   INR 0.9 10/26/2013   HGBA1C 6.1 11/22/2013    --------------------------------------------------------------------------------------------------------------------- Dg C-arm 1-60 Min-no Report  Result Date:  01/18/2018 Fluoroscopy was utilized by the requesting physician.  No radiographic interpretation.     Assessment & Plan:   Miana was seen today for procedure, back pain and medication refill.  Diagnoses and all orders for this visit:  Chronic pain syndrome  Chronic, continuous use of opioids  Low back derangement syndrome  Chronic right-sided low back pain with right-sided sciatica  Facet arthritis of cervical region  DDD (degenerative disc disease), lumbar  Cervicalgia  Other orders -     Discontinue: HYDROcodone-acetaminophen (NORCO/VICODIN) 5-325 MG tablet; Take 1 tablet by mouth 3 (three) times daily. -  HYDROcodone-acetaminophen (NORCO/VICODIN) 5-325 MG tablet; Take 1 tablet by mouth 3 (three) times daily. -     triamcinolone acetonide (KENALOG-40) injection 40 mg -     sodium chloride flush (NS) 0.9 % injection 10 mL -     ropivacaine (PF) 2 mg/mL (0.2%) (NAROPIN) injection 10 mL -     lidocaine (PF) (XYLOCAINE) 1 % injection 5 mL -     iopamidol (ISOVUE-M) 41 % intrathecal injection 20 mL        ----------------------------------------------------------------------------------------------------------------------  Problem List Items Addressed This Visit      Unprioritized   DDD (degenerative disc disease), lumbar   Relevant Medications   HYDROcodone-acetaminophen (NORCO/VICODIN) 5-325 MG tablet   triamcinolone acetonide (KENALOG-40) injection 40 mg (Completed)   Facet arthritis of cervical region   Relevant Medications   HYDROcodone-acetaminophen (NORCO/VICODIN) 5-325 MG tablet   triamcinolone acetonide (KENALOG-40) injection 40 mg (Completed)    Other Visit Diagnoses    Chronic pain syndrome    -  Primary   Chronic, continuous use of opioids       Low back derangement syndrome       Relevant Medications   HYDROcodone-acetaminophen (NORCO/VICODIN) 5-325 MG tablet   triamcinolone acetonide (KENALOG-40) injection 40 mg (Completed)   Chronic right-sided  low back pain with right-sided sciatica       Relevant Medications   HYDROcodone-acetaminophen (NORCO/VICODIN) 5-325 MG tablet   triamcinolone acetonide (KENALOG-40) injection 40 mg (Completed)   Cervicalgia            ----------------------------------------------------------------------------------------------------------------------  1. Chronic pain syndrome In regards to her opioid medications.  She continues to do well with these and is using them as prescribed.  We have reviewed the [facility] practitioner database information and it is appropriate.  Refills will be given for May 13 and June 12 with return to clinic in 2 months.  2. Chronic, continuous use of opioids As above  3. Low back derangement syndrome As above  4. Chronic right-sided low back pain with right-sided sciatica We will proceed with a repeat caudal epidural steroid injection today.  The risks and benefits of been reviewed in full detail.  5. Facet arthritis of cervical region With core stretching strengthening exercises.  6. DDD (degenerative disc disease), lumbar   7. Cervicalgia     ----------------------------------------------------------------------------------------------------------------------  I am having Sofi S. Offner maintain her vitamin B-12, fluticasone, loratadine-pseudoephedrine, Olopatadine HCl, metoprolol tartrate, atorvastatin, DULoxetine, furosemide, dextromethorphan-guaiFENesin, docusate sodium, naproxen, potassium chloride, tamsulosin, Cholecalciferol (VITAMIN D3 PO), methocarbamol, methocarbamol, and HYDROcodone-acetaminophen. We administered triamcinolone acetonide, sodium chloride flush, ropivacaine (PF) 2 mg/mL (0.2%), lidocaine (PF), and iopamidol. We will continue to administer lactated ringers, iopamidol, triamcinolone acetonide, sodium chloride flush, ropivacaine (PF) 2 mg/mL (0.2%), midazolam, lidocaine (PF), and lactated ringers.   Meds ordered this encounter   Medications  . DISCONTD: HYDROcodone-acetaminophen (NORCO/VICODIN) 5-325 MG tablet    Sig: Take 1 tablet by mouth 3 (three) times daily.    Dispense:  90 tablet    Refill:  0    Do not fill until [id number]  . HYDROcodone-acetaminophen (NORCO/VICODIN) 5-325 MG tablet    Sig: Take 1 tablet by mouth 3 (three) times daily.    Dispense:  90 tablet    Refill:  0    Do not fill until [id number]  . triamcinolone acetonide (KENALOG-40) injection 40 mg  . sodium chloride flush (NS) 0.9 % injection 10 mL  . ropivacaine (PF) 2 mg/mL (0.2%) (NAROPIN) injection 10 mL  . lidocaine (PF) (XYLOCAINE) 1 %  injection 5 mL  . iopamidol (ISOVUE-M) 41 % intrathecal injection 20 mL   Patient's Medications  New Prescriptions   No medications on file  Previous Medications   ATORVASTATIN (LIPITOR) 40 MG TABLET    Take 40 mg by mouth daily.   CHOLECALCIFEROL (VITAMIN D3 PO)    Take by mouth daily.   CYANOCOBALAMIN (V-R VITAMIN B-12) 500 MCG TABLET    Take 500 mcg by mouth daily.    DEXTROMETHORPHAN-GUAIFENESIN (MUCINEX DM) 30-600 MG 12HR TABLET    Take 1 tablet by mouth 2 (two) times daily.   DOCUSATE SODIUM (COLACE) 100 MG CAPSULE    Take 1 capsule (100 mg total) by mouth 2 (two) times daily.   DULOXETINE (CYMBALTA) 60 MG CAPSULE    Take 60 mg by mouth every evening. Patient states she also takes a  in the morning per patient   FLUTICASONE (FLONASE) 50 MCG/ACT NASAL SPRAY    Place into the nose.   FUROSEMIDE (LASIX) 20 MG TABLET    Take 20 mg by mouth 3 (three) times daily as needed for fluid.   LORATADINE-PSEUDOEPHEDRINE (CLARITIN-D 12-HOUR) 5-120 MG PER TABLET    Take 1 tablet by mouth daily.    METHOCARBAMOL (ROBAXIN) 750 MG TABLET    take 1 tablet by mouth three times a day   METHOCARBAMOL (ROBAXIN-750) 750 MG TABLET    Take 1 tablet (750 mg total) by mouth 4 (four) times daily.   METOPROLOL (LOPRESSOR) 50 MG TABLET    Take 50 mg by mouth 2 (two) times daily.    NAPROXEN (NAPROSYN) 500 MG TABLET    500  mg 2 (two) times daily with a meal.    OLOPATADINE HCL (PATADAY) 0.2 % SOLN    Place 1 drop into both eyes daily as needed. For dry eyes   POTASSIUM CHLORIDE (K-DUR) 10 MEQ TABLET    Take 10 mEq by mouth 2 (two) times daily.    TAMSULOSIN (FLOMAX) 0.4 MG CAPS CAPSULE      Modified Medications   Modified Medication Previous Medication   HYDROCODONE-ACETAMINOPHEN (NORCO/VICODIN) 5-325 MG TABLET HYDROcodone-acetaminophen (NORCO/VICODIN) 5-325 MG tablet      Take 1 tablet by mouth 3 (three) times daily.    Take 1 tablet by mouth 3 (three) times daily.  Discontinued Medications   No medications on file   ----------------------------------------------------------------------------------------------------------------------  Follow-up: Return for evaluation, med refill.   Procedure: Caudal epidural steroid No. 2 under fluoroscopic guidance without sedation.  After informed consent was obtained and the risks benefits reviewed patient chose to pursue a caudal epidural steroid injection today. With the patient in the prone position and no sedation // Using AP and lateral fluoroscopic guidance I identified the area overlying the sacral hiatus. This area was broadly prepped with DuraPrep 3 and we utilized strict aseptic technique during the procedure. 1% lidocaine was infiltrated 2 cc using a 25-gauge needle overlying the sacral cornu and then using lateral fluoroscopic guidance I advanced an 18-gauge Touhy needle approximately 2 cm through the sacral hiatus. Confirmation was with 2 cc of Isovue yielding good epidural spread and no evidence of IV or subarachnoid uptake. This was followed by an injection of 5 cc of saline mixed with 1 cc of ropivacaine 0.2% and 40 mg of triamcinolone. This was tolerated without difficulty the patient was convalesced and discharged home in stable condition for follow-up as mentioned. Lanora Manis, MD

## 2018-01-18 NOTE — Patient Instructions (Signed)
Hydrocodone prescription given x2 to last until 03-19-2018.

## 2018-01-18 NOTE — Progress Notes (Signed)
Safety precautions to be maintained throughout the outpatient stay will include: orient to surroundings, keep bed in low position, maintain call bell within reach at all times, provide assistance with transfer out of bed and ambulation.  

## 2018-01-19 ENCOUNTER — Telehealth: Payer: Self-pay | Admitting: *Deleted

## 2018-01-19 NOTE — Telephone Encounter (Signed)
Attempted to call for post procedure follow-up. Voice mailbox not set up. 

## 2018-03-10 ENCOUNTER — Ambulatory Visit: Payer: Self-pay | Attending: Anesthesiology | Admitting: Anesthesiology

## 2018-03-10 ENCOUNTER — Telehealth: Payer: Self-pay | Admitting: *Deleted

## 2018-06-19 ENCOUNTER — Other Ambulatory Visit: Payer: Self-pay | Admitting: Anesthesiology

## 2020-01-31 ENCOUNTER — Other Ambulatory Visit: Payer: Self-pay

## 2020-01-31 ENCOUNTER — Emergency Department: Payer: Self-pay

## 2020-01-31 ENCOUNTER — Emergency Department
Admission: EM | Admit: 2020-01-31 | Discharge: 2020-01-31 | Disposition: A | Discharge status: Home / Self Care | Payer: Self-pay | Attending: Emergency Medicine | Admitting: Emergency Medicine

## 2020-01-31 ENCOUNTER — Encounter: Payer: Self-pay | Admitting: Emergency Medicine

## 2020-01-31 DIAGNOSIS — R0602 Shortness of breath: Secondary | ICD-10-CM | POA: Insufficient documentation

## 2020-01-31 DIAGNOSIS — R05 Cough: Secondary | ICD-10-CM | POA: Insufficient documentation

## 2020-01-31 DIAGNOSIS — N189 Chronic kidney disease, unspecified: Secondary | ICD-10-CM | POA: Insufficient documentation

## 2020-01-31 DIAGNOSIS — Z79899 Other long term (current) drug therapy: Secondary | ICD-10-CM | POA: Insufficient documentation

## 2020-01-31 DIAGNOSIS — F17210 Nicotine dependence, cigarettes, uncomplicated: Secondary | ICD-10-CM | POA: Insufficient documentation

## 2020-01-31 DIAGNOSIS — R059 Cough, unspecified: Secondary | ICD-10-CM

## 2020-01-31 LAB — CBC
HCT: 41.5 % (ref 36.0–46.0)
Hemoglobin: 13.4 g/dL (ref 12.0–15.0)
MCH: 30.5 pg (ref 26.0–34.0)
MCHC: 32.3 g/dL (ref 30.0–36.0)
MCV: 94.3 fL (ref 80.0–100.0)
Platelets: 208 10*3/uL (ref 150–400)
RBC: 4.4 MIL/uL (ref 3.87–5.11)
RDW: 12.9 % (ref 11.5–15.5)
WBC: 10.4 10*3/uL (ref 4.0–10.5)
nRBC: 0 % (ref 0.0–0.2)

## 2020-01-31 LAB — BASIC METABOLIC PANEL
Anion gap: 7 (ref 5–15)
BUN: 14 mg/dL (ref 8–23)
CO2: 25 mmol/L (ref 22–32)
Calcium: 9.2 mg/dL (ref 8.9–10.3)
Chloride: 105 mmol/L (ref 98–111)
Creatinine, Ser: 0.86 mg/dL (ref 0.44–1.00)
GFR calc Af Amer: >60 mL/min (ref >60)
GFR calc non Af Amer: >60 mL/min (ref >60)
Glucose, Bld: 104 mg/dL — ABNORMAL HIGH (ref 70–99)
Potassium: 4.1 mmol/L (ref 3.5–5.1)
Sodium: 137 mmol/L (ref 135–145)

## 2020-01-31 LAB — TROPONIN I (HIGH SENSITIVITY): Troponin I (High Sensitivity): 3 ng/L (ref <18)

## 2020-01-31 MED ORDER — PREDNISONE 10 MG (48) PO TBPK: ORAL_TABLET | ORAL | 0 refills | Status: DC

## 2020-01-31 MED ORDER — ACETYLCYSTEINE 20 % IN SOLN
4.0000 mL | Freq: Once | RESPIRATORY_TRACT | Status: AC
  Administered 2020-01-31: 4 mL via RESPIRATORY_TRACT
  Filled 2020-01-31: qty 4

## 2020-01-31 MED ORDER — ACETYLCYSTEINE 20 % IN SOLN: 4.0000 mL | RESPIRATORY_TRACT | 1 refills | Status: DC

## 2020-01-31 NOTE — ED Provider Notes (Signed)
[facility] Emergency Department Provider Note   ____________________________________________   I have reviewed the triage vital signs and the nursing notes.   HISTORY  Chief Complaint Shortness of Breath   History limited by: Not Limited   HPI Madison [name] is a 69 y.o. female who presents to the emergency department today because of concern for shortness of breath. The patient states that she has been having some issues with her breathing for the past couple of months. She has history of breathing problems in the past and states that they are made worse with allergies. She was recently seen at an urgent care and put on steroid taper and course of doxycycline. She did feel some improvement when she was on the steroid but once that finished she started feeling bad again. The patient had a coughing episode today which resulted in some hypoxia. She felt like she has mucus she still needs to get out. The patient denies any fevers.   Records reviewed. Per medical record review patient has a history of anemia, pneumonia, COPD.   Past Medical History:  Diagnosis Date  . Anemia   . Arthritis   . Blood transfusion without reported diagnosis   . Chronic kidney disease 05/22/2016   urinary retention  has catheter     . Edema extremities   . Hyperlipidemia   . Peripheral vascular disease ([place])   . Pneumonia 11/2013   Hx. pneumonia     . Seasonal allergies   . Tachycardia     Patient Active Problem List   Diagnosis Date Noted  . Spondylolisthesis of lumbar region 05/26/2016  . Facet arthritis of cervical region 03/05/2015  . DDD (degenerative disc disease), lumbar 03/05/2015    Past Surgical History:  Procedure Laterality Date  . ANKLE SURGERY Right   . APPENDECTOMY    . BACK SURGERY  05/2016  . CARPAL TUNNEL RELEASE Left   . CHOLECYSTECTOMY    . TONSILLECTOMY    . TUBAL LIGATION    . VEIN LIGATION AND STRIPPING      Prior to Admission  medications   Medication Sig Start Date End Date Taking? Authorizing Provider  atorvastatin (LIPITOR) 40 MG tablet Take 40 mg by mouth daily.    [provider]  Cholecalciferol (VITAMIN D3 PO) Take by mouth daily.    [provider]  cyanocobalamin (V-R VITAMIN B-12) 500 MCG tablet Take 500 mcg by mouth daily.     [provider]  dextromethorphan-guaiFENesin (MUCINEX DM) 30-600 MG 12hr tablet Take 1 tablet by mouth 2 (two) times daily.    [provider]  docusate sodium (COLACE) 100 MG capsule Take 1 capsule (100 mg total) by mouth 2 (two) times daily. Patient not taking: Reported on 08/18/2017 05/27/16   Newman Pies, MD  DULoxetine (CYMBALTA) 60 MG capsule Take 60 mg by mouth every evening. Patient states she also takes a 30mg  in the morning per patient    [provider]  fluticasone (FLONASE) 50 MCG/ACT nasal spray Place into the nose. 02/01/15 01/18/18  [provider]  furosemide (LASIX) 20 MG tablet Take 20 mg by mouth 3 (three) times daily as needed for fluid.    [provider]  HYDROcodone-acetaminophen (NORCO/VICODIN) 5-325 MG tablet Take 1 tablet by mouth 3 (three) times daily. 01/18/18   Molli Barrows, MD  loratadine-pseudoephedrine (CLARITIN-D 12-HOUR) 5-120 MG per tablet Take 1 tablet by mouth daily.     [provider]  methocarbamol (ROBAXIN) 750  MG tablet take 1 tablet by mouth three times a day 05/27/17   Yevette Edwards, MD  methocarbamol (ROBAXIN-750) 750 MG tablet Take 1 tablet (750 mg total) by mouth 4 (four) times daily. 06/18/17   Yevette Edwards, MD  metoprolol (LOPRESSOR) 50 MG tablet Take 50 mg by mouth 2 (two) times daily.  02/26/16   [provider]  naproxen (NAPROSYN) 500 MG tablet 500 mg 2 (two) times daily with a meal.  05/07/16   [provider]  Olopatadine HCl (PATADAY) 0.2 % SOLN Place 1 drop into both eyes daily as needed. For dry eyes 04/21/12   [provider]   potassium chloride (K-DUR) 10 MEQ tablet Take 10 mEq by mouth 2 (two) times daily.  06/17/16   [provider]  tamsulosin (FLOMAX) 0.4 MG CAPS capsule  07/27/16   [provider]    Allergies Eggs or egg-derived products, Morphine and related, Fentanyl, Spiriva handihaler [tiotropium bromide monohydrate], Sulfa antibiotics, Penicillins, and Voltaren [diclofenac]  Family History  Problem Relation Age of Onset  . Heart disease Mother   . Hypertension Mother   . Hyperlipidemia Mother   . Diabetes Brother     Social History Social History   Tobacco Use  . Smoking status: Current Every Day Smoker    Packs/day: 0.25    Years: 0.50    Pack years: 0.12    Types: Cigarettes  . Smokeless tobacco: Never Used  . Tobacco comment: 4- 5 cigs per day.   Substance Use Topics  . Alcohol use: Yes    Alcohol/week: 0.0 standard drinks    Comment: ocasionally  glass wine  . Drug use: No    Review of Systems Constitutional: No fever/chills Eyes: No visual changes. ENT: No sore throat. Cardiovascular: Denies chest pain. Respiratory: Positive for cough and shortness of breath. Gastrointestinal: No abdominal pain.  No nausea, no vomiting.  No diarrhea.   Genitourinary: Negative for dysuria. Musculoskeletal: Negative for back pain. Skin: Negative for rash. Neurological: Negative for headaches, focal weakness or numbness.  ____________________________________________   PHYSICAL EXAM:  VITAL SIGNS: ED Triage Vitals  Enc Vitals Group     BP 01/31/20 1654 (!) 162/65     Pulse Rate 01/31/20 1654 80     Resp 01/31/20 1654 (!) 21     Temp 01/31/20 1654 98.2 F (36.8 C)     Temp Source 01/31/20 1654 Oral     SpO2 01/31/20 1654 96 %     Weight 01/31/20 1655 201 lb 15.1 oz (91.6 kg)     Height 01/31/20 1655 4\' 11"  (1.499 m)     Head Circumference --      Peak Flow --      Pain Score 01/31/20 1655 0   Constitutional: Alert and oriented.  Eyes: Conjunctivae are  normal.  ENT      Head: Normocephalic and atraumatic.      Nose: No congestion/rhinnorhea.      Mouth/Throat: Mucous membranes are moist.      Neck: No stridor. Hematological/Lymphatic/Immunilogical: No cervical lymphadenopathy. Cardiovascular: Normal rate, regular rhythm.  No murmurs, rubs, or gallops.  Respiratory: Slightly increased respiratory distress.  Gastrointestinal: Soft and non tender. No rebound. No guarding.  Genitourinary: Deferred Musculoskeletal: Normal range of motion in all extremities. No lower extremity edema. Neurologic:  Normal speech and language. No gross focal neurologic deficits are appreciated.  Skin:  Skin is warm, dry and intact. No rash noted. Psychiatric: Mood and affect are normal. Speech  and behavior are normal. Patient exhibits appropriate insight and judgment.  ____________________________________________    LABS (pertinent positives/negatives)  Trop hs 3 CBC wbc 10.4, hgb 13.4, plt 208 BMP wnl except glu 104  ____________________________________________   EKG  I, Phineas Semen, attending physician, personally viewed and interpreted this EKG  EKG Time: 1654 Rate: 80 Rhythm: sinus rhythm Axis: normal Intervals: qtc 465 QRS: narrow ST changes: no st elevation Impression: probable left atrial enlargement, otherwise normal ekg   ____________________________________________    RADIOLOGY  CXR No acute abnormality  ____________________________________________   PROCEDURES  Procedures  ____________________________________________   INITIAL IMPRESSION / ASSESSMENT AND PLAN / ED COURSE  Pertinent labs & imaging results that were available during my care of the patient were reviewed by me and considered in my medical decision making (see chart for details).   Patient presented to the emergency department today because of concerns for shortness of breath.  One of the patient's main complaint was that she feels like she has mucus  that she cannot get out.  She was given Solu-Medrol and duo nebs by EMS and did get some relief from the DuoNeb.  I did write for patient get a Mucomyst nebulizer.  She states this helped immensely.  She was able to cough up more mucus.  Chest x-ray without any pneumonia.  She just finished course of antibiotics.  Will give patient course of steroids.  ___________________________________________   FINAL CLINICAL IMPRESSION(S) / ED DIAGNOSES  Final diagnoses:  Shortness of breath  Cough     Note: This dictation was prepared with Dragon dictation. Any transcriptional errors that result from this process are unintentional     Phineas Semen, MD 01/31/20 [phone]

## 2020-01-31 NOTE — ED Triage Notes (Signed)
Pt presents from home via [facility] with c/o shortness of breath. Pt took 2 breathing tx at home with some relief. Bilateral edema noted to lower extremities. 2 duonebs given by ems. wheezing noted in bilateral lungs per ems. Allergic to fentanyl. Hx of MRSA pnemonia. 125 mg solumedrol given in route as well. 135 CBG.

## 2020-01-31 NOTE — Discharge Instructions (Addendum)
Please seek medical attention for any high fevers, chest pain, shortness of breath, change in behavior, persistent vomiting, bloody stool or any other new or concerning symptoms.  

## 2020-01-31 NOTE — ED Notes (Signed)
Pt up to bedside toilet. Pt has clean linens and asked for a chuck to be placed down. Pt is back in bed resting at this time with no complaints.

## 2020-04-22 ENCOUNTER — Emergency Department: Payer: Self-pay

## 2020-04-22 ENCOUNTER — Observation Stay
Admission: EM | Admit: 2020-04-22 | Discharge: 2020-04-23 | Disposition: A | Discharge status: Home / Self Care | Payer: Self-pay | Attending: Student in an Organized Health Care Education/Training Program | Admitting: Student in an Organized Health Care Education/Training Program

## 2020-04-22 DIAGNOSIS — Z79899 Other long term (current) drug therapy: Secondary | ICD-10-CM | POA: Insufficient documentation

## 2020-04-22 DIAGNOSIS — I129 Hypertensive chronic kidney disease with stage 1 through stage 4 chronic kidney disease, or unspecified chronic kidney disease: Secondary | ICD-10-CM | POA: Insufficient documentation

## 2020-04-22 DIAGNOSIS — Z20822 Contact with and (suspected) exposure to covid-19: Secondary | ICD-10-CM | POA: Insufficient documentation

## 2020-04-22 DIAGNOSIS — I10 Essential (primary) hypertension: Secondary | ICD-10-CM

## 2020-04-22 DIAGNOSIS — F17210 Nicotine dependence, cigarettes, uncomplicated: Secondary | ICD-10-CM | POA: Insufficient documentation

## 2020-04-22 DIAGNOSIS — J441 Chronic obstructive pulmonary disease with (acute) exacerbation: Principal | ICD-10-CM | POA: Diagnosis present

## 2020-04-22 DIAGNOSIS — R600 Localized edema: Secondary | ICD-10-CM

## 2020-04-22 DIAGNOSIS — N189 Chronic kidney disease, unspecified: Secondary | ICD-10-CM | POA: Insufficient documentation

## 2020-04-22 LAB — CBC
HCT: 39.9 % (ref 36.0–46.0)
Hemoglobin: 12.9 g/dL (ref 12.0–15.0)
MCH: 30.6 pg (ref 26.0–34.0)
MCHC: 32.3 g/dL (ref 30.0–36.0)
MCV: 94.5 fL (ref 80.0–100.0)
Platelets: 209 10*3/uL (ref 150–400)
RBC: 4.22 MIL/uL (ref 3.87–5.11)
RDW: 13.6 % (ref 11.5–15.5)
WBC: 8.6 10*3/uL (ref 4.0–10.5)
nRBC: 0 % (ref 0.0–0.2)

## 2020-04-22 LAB — CBC WITH DIFFERENTIAL/PLATELET
Abs Immature Granulocytes: 0.06 10*3/uL (ref 0.00–0.07)
Basophils Absolute: 0.1 10*3/uL (ref 0.0–0.1)
Basophils Relative: 1 %
Eosinophils Absolute: 0.8 10*3/uL — ABNORMAL HIGH (ref 0.0–0.5)
Eosinophils Relative: 9 %
HCT: 39.9 % (ref 36.0–46.0)
Hemoglobin: 12.9 g/dL (ref 12.0–15.0)
Immature Granulocytes: 1 %
Lymphocytes Relative: 21 %
Lymphs Abs: 1.8 10*3/uL (ref 0.7–4.0)
MCH: 30.6 pg (ref 26.0–34.0)
MCHC: 32.3 g/dL (ref 30.0–36.0)
MCV: 94.8 fL (ref 80.0–100.0)
Monocytes Absolute: 0.6 10*3/uL (ref 0.1–1.0)
Monocytes Relative: 7 %
Neutro Abs: 5.1 10*3/uL (ref 1.7–7.7)
Neutrophils Relative %: 61 %
Platelets: 206 10*3/uL (ref 150–400)
RBC: 4.21 MIL/uL (ref 3.87–5.11)
RDW: 13.8 % (ref 11.5–15.5)
WBC: 8.4 10*3/uL (ref 4.0–10.5)
nRBC: 0 % (ref 0.0–0.2)

## 2020-04-22 LAB — BASIC METABOLIC PANEL
Anion gap: 11 (ref 5–15)
BUN: 18 mg/dL (ref 8–23)
CO2: 24 mmol/L (ref 22–32)
Calcium: 9.3 mg/dL (ref 8.9–10.3)
Chloride: 105 mmol/L (ref 98–111)
Creatinine, Ser: 0.77 mg/dL (ref 0.44–1.00)
GFR calc Af Amer: >60 mL/min (ref >60)
GFR calc non Af Amer: >60 mL/min (ref >60)
Glucose, Bld: 107 mg/dL — ABNORMAL HIGH (ref 70–99)
Potassium: 4.3 mmol/L (ref 3.5–5.1)
Sodium: 140 mmol/L (ref 135–145)

## 2020-04-22 LAB — BRAIN NATRIURETIC PEPTIDE: B Natriuretic Peptide: 58.3 pg/mL (ref 0.0–100.0)

## 2020-04-22 LAB — SARS CORONAVIRUS 2 BY RT PCR (HOSPITAL ORDER, PERFORMED IN ~~LOC~~ HOSPITAL LAB): SARS Coronavirus 2: NEGATIVE

## 2020-04-22 LAB — TROPONIN I (HIGH SENSITIVITY)
Troponin I (High Sensitivity): <2 ng/L (ref <18)
Troponin I (High Sensitivity): <2 ng/L (ref <18)

## 2020-04-22 MED ORDER — METHYLPREDNISOLONE SODIUM SUCC 40 MG IJ SOLR
40.0000 mg | Freq: Every day | INTRAMUSCULAR | Status: DC
  Administered 2020-04-23: 40 mg via INTRAVENOUS
  Filled 2020-04-22: qty 1

## 2020-04-22 MED ORDER — SODIUM CHLORIDE 0.9 % IV SOLN
1.0000 g | INTRAVENOUS | Status: DC
  Administered 2020-04-22: 1 g via INTRAVENOUS
  Filled 2020-04-22 (×2): qty 10

## 2020-04-22 MED ORDER — CYANOCOBALAMIN 500 MCG PO TABS
500.0000 ug | ORAL_TABLET | Freq: Every day | ORAL | Status: DC
  Administered 2020-04-23: 500 ug via ORAL
  Filled 2020-04-22: qty 1

## 2020-04-22 MED ORDER — FUROSEMIDE 10 MG/ML IJ SOLN
20.0000 mg | Freq: Once | INTRAMUSCULAR | Status: AC
  Administered 2020-04-22: 20 mg via INTRAVENOUS
  Filled 2020-04-22: qty 4

## 2020-04-22 MED ORDER — METHYLPREDNISOLONE SODIUM SUCC 125 MG IJ SOLR
125.0000 mg | Freq: Once | INTRAMUSCULAR | Status: AC
  Administered 2020-04-22: 125 mg via INTRAVENOUS
  Filled 2020-04-22: qty 2

## 2020-04-22 MED ORDER — FLUTICASONE PROPIONATE 50 MCG/ACT NA SUSP
1.0000 | Freq: Every day | NASAL | Status: DC
  Administered 2020-04-23: 1 via NASAL
  Filled 2020-04-22: qty 16

## 2020-04-22 MED ORDER — ALBUTEROL SULFATE (2.5 MG/3ML) 0.083% IN NEBU
2.5000 mg | INHALATION_SOLUTION | Freq: Once | RESPIRATORY_TRACT | Status: AC
  Administered 2020-04-22: 2.5 mg via RESPIRATORY_TRACT
  Filled 2020-04-22: qty 3

## 2020-04-22 MED ORDER — BISACODYL 5 MG PO TBEC: 5.0000 mg | DELAYED_RELEASE_TABLET | Freq: Every day | ORAL | Status: DC | PRN

## 2020-04-22 MED ORDER — ENOXAPARIN SODIUM 40 MG/0.4ML ~~LOC~~ SOLN
40.0000 mg | SUBCUTANEOUS | Status: DC
  Administered 2020-04-22: 40 mg via SUBCUTANEOUS
  Filled 2020-04-22: qty 0.4

## 2020-04-22 MED ORDER — BENZONATATE 100 MG PO CAPS
100.0000 mg | ORAL_CAPSULE | Freq: Three times a day (TID) | ORAL | Status: DC | PRN
  Administered 2020-04-23: 100 mg via ORAL
  Filled 2020-04-22: qty 1

## 2020-04-22 MED ORDER — SODIUM CHLORIDE 0.9 % IV SOLN
500.0000 mg | INTRAVENOUS | Status: DC
  Administered 2020-04-22: 500 mg via INTRAVENOUS
  Filled 2020-04-22: qty 500

## 2020-04-22 MED ORDER — DM-GUAIFENESIN ER 30-600 MG PO TB12
1.0000 | ORAL_TABLET | Freq: Two times a day (BID) | ORAL | Status: DC
  Administered 2020-04-22 – 2020-04-23 (×2): 1 via ORAL
  Filled 2020-04-22 (×2): qty 1

## 2020-04-22 MED ORDER — ALBUTEROL SULFATE (2.5 MG/3ML) 0.083% IN NEBU
2.5000 mg | INHALATION_SOLUTION | Freq: Four times a day (QID) | RESPIRATORY_TRACT | Status: DC
  Administered 2020-04-23 (×2): 2.5 mg via RESPIRATORY_TRACT
  Filled 2020-04-22 (×3): qty 3

## 2020-04-22 MED ORDER — METOPROLOL TARTRATE 50 MG PO TABS
50.0000 mg | ORAL_TABLET | Freq: Two times a day (BID) | ORAL | Status: DC
  Administered 2020-04-22 – 2020-04-23 (×2): 50 mg via ORAL
  Filled 2020-04-22: qty 2
  Filled 2020-04-22: qty 1

## 2020-04-22 NOTE — H&P (Addendum)
History and Physical    Madison [name] ZOX:[id number] DOB: 01-22-1951 DOA: 04/22/2020  PCP: Dione Housekeeperlmedo, Mario Ernesto, MD  Patient coming from: Home  I have personally briefly reviewed patient's old medical records in [facility]  Chief Complaint: Increasing shortness of breath  HPI: Madison CobbCindy Sue [name] is a 69 y.o. female with medical history significant for hypertension, COPD, history of acute respiratory distress requiring mechanical ventilation due to MRSA pneumonia, morbid obesity who presents with concerns of increasing shortness of breath.  Patient states that she has had occasional "attacks" since July that she thinks is due to allergies.  Yesterday she began to feel like she was having another one of those attacks and tried taking her Mucomyst and albuterol today without much relief.  Also has noticed increased coughing productive of thick sputum.  Noted her oxygen to drop down to 90% oxygen saturation at home.  Denies any chest pain.  No fever.  No sick contact.  She has also noticed increased lower extremity edema that is new for her. No nausea, vomiting or diarrhea.States she quit smoking 4 days ago.  ED Course:  She was afebrile, tachypneic and initially required up to 4 L O2 via nasal cannula but has since been able to wean off.CBC and BMP otherwise unremarkable.  BNP of 58.  Chest x-ray shows bilateral opacity versus pulmonary edema.  Covid PCR pending.  Review of Systems:  Constitutional: No Weight Change, No Fever ENT/Mouth: No sore throat, No Rhinorrhea Eyes: No Eye Pain, No Vision Changes Cardiovascular: No Chest Pain, + SOB, No PND, + Dyspnea on Exertion, No Orthopnea, No Claudication, + Edema, No Palpitations Respiratory: No Cough, No Sputum, No Wheezing, no Dyspnea  Gastrointestinal: No Nausea, No Vomiting, No Diarrhea, No Constipation, No Pain Genitourinary: no Urinary Incontinence Musculoskeletal: No Arthralgias, No Myalgias Skin: No Skin Lesions, No  Pruritus, Neuro: no Weakness, No Numbness Psych: No Anxiety/Panic, No Depression, no decrease appetite Heme/Lymph: No Bruising, No Bleeding   Past Medical History:  Diagnosis Date  . Anemia   . Arthritis   . Blood transfusion without reported diagnosis   . Chronic kidney disease 05/22/2016   urinary retention  has catheter     . Edema extremities   . Hyperlipidemia   . Peripheral vascular disease (HCC)   . Pneumonia 11/2013   Hx. pneumonia     . Seasonal allergies   . Tachycardia     Past Surgical History:  Procedure Laterality Date  . ANKLE SURGERY Right   . APPENDECTOMY    . BACK SURGERY  05/2016  . CARPAL TUNNEL RELEASE Left   . CHOLECYSTECTOMY    . TONSILLECTOMY    . TUBAL LIGATION    . VEIN LIGATION AND STRIPPING       reports that she has been smoking cigarettes. She has a 0.13 pack-year smoking history. She has never used smokeless tobacco. She reports current alcohol use. She reports that she does not use drugs. Social History  Allergies  Allergen Reactions  . Eggs Or Egg-Derived Products Anaphylaxis    Egg albumin  . Morphine And Related     unknown  . Fentanyl   . Spiriva Handihaler [Tiotropium Bromide Monohydrate] Other (See Comments)    bronchiospasms  . Sulfa Antibiotics Hives  . Penicillins Rash    Has patient had a PCN reaction causing immediate rash, facial/tongue/throat swelling, SOB or lightheadedness with hypotension: YES Has patient had a PCN reaction causing severe rash involving mucus membranes or skin necrosis: NO  Has patient had a PCN reaction that required hospitalization NO Has patient had a PCN reaction occurring within the last 10 years: NO If all of the above answers are "NO", then may proceed with Cephalosporin use.  . Voltaren [Diclofenac] Rash    Family History  Problem Relation Age of Onset  . Heart disease Mother   . Hypertension Mother   . Hyperlipidemia Mother   . Diabetes Brother      Prior to Admission medications    Medication Sig Start Date End Date Taking? Authorizing Provider  acetylcysteine (MUCOMYST) 20 % nebulizer solution Take 4 mLs by nebulization every 4 (four) hours. 01/31/20  Yes Phineas Semen, MD  albuterol (PROVENTIL) (2.5 MG/3ML) 0.083% nebulizer solution SMARTSIG:3 Milliliter(s) Via Nebulizer Every 6 Hours PRN 04/17/20  Yes [provider]  benzonatate (TESSALON) 100 MG capsule Take by mouth 3 (three) times daily as needed for cough.   Yes [provider]  bisacodyl (DULCOLAX) 5 MG EC tablet Take 5 mg by mouth daily as needed for moderate constipation.   Yes [provider]  Cholecalciferol (VITAMIN D3 PO) Take by mouth daily.   Yes [provider]  cyanocobalamin (V-R VITAMIN B-12) 500 MCG tablet Take 500 mcg by mouth daily.    Yes [provider]  dextromethorphan-guaiFENesin (MUCINEX DM) 30-600 MG 12hr tablet Take 1 tablet by mouth 2 (two) times daily.   Yes [provider]  fluticasone (FLONASE) 50 MCG/ACT nasal spray Place into both nostrils daily.   Yes [provider]  loratadine-pseudoephedrine (CLARITIN-D 12-HOUR) 5-120 MG per tablet Take 1 tablet by mouth daily.    Yes [provider]  metoprolol (LOPRESSOR) 50 MG tablet Take 50 mg by mouth 2 (two) times daily.  02/26/16  Yes [provider]  naproxen (NAPROSYN) 500 MG tablet Take 500 mg by mouth 2 (two) times daily as needed.   Yes [provider]  zinc sulfate 220 (50 Zn) MG capsule Take 220 mg by mouth daily.   Yes [provider]    Physical Exam: Vitals:   04/22/20 1421 04/22/20 1623 04/22/20 1641 04/22/20 1833  BP: (!) 134/58 128/78 119/64 127/88  Pulse: 84 91 85 83  Resp: (!) 30 (!) 26 (!) 26 (!) 24  Temp: 98.8 F (37.1 C)     TempSrc: Oral     SpO2: 99% 97% 99% 100%  Weight: 92.5 kg     Height: 4' 11.5" (1.511 m)       Constitutional: NAD, calm, comfortable, non-toxic appearing female sitting upright in bed Vitals:    04/22/20 1421 04/22/20 1623 04/22/20 1641 04/22/20 1833  BP: (!) 134/58 128/78 119/64 127/88  Pulse: 84 91 85 83  Resp: (!) 30 (!) 26 (!) 26 (!) 24  Temp: 98.8 F (37.1 C)     TempSrc: Oral     SpO2: 99% 97% 99% 100%  Weight: 92.5 kg     Height: 4' 11.5" (1.511 m)      Eyes: PERRL, lids and conjunctivae normal ENMT: Mucous membranes are moist.  Neck: normal, supple Respiratory: Poor aeration with diffuse wheezing throughout.  Normal respiratory effort at rest but with increased dyspnea with exertion and conversation. No accessory muscle use.  Cardiovascular: Regular rate and rhythm, no murmurs / rubs / gallops. +3 bilateral pitting edema up to knee with chronic venous stasis skin changes and erythema Abdomen: no tenderness, no masses palpated.Bowel sounds positive.  Musculoskeletal: no clubbing / cyanosis. No joint deformity upper and lower extremities.  Good ROM, no contractures. Normal muscle tone.  Skin: no rashes, lesions, ulcers. No induration Neurologic: CN 2-12 grossly intact. Sensation intact,  Strength 5/5 in all 4.  Psychiatric: Normal judgment and insight. Alert and oriented x 3. Normal mood.     Labs on Admission: I have personally reviewed following labs and imaging studies  CBC: Recent Labs  Lab 04/22/20 1431  WBC 8.4  8.6  NEUTROABS 5.1  HGB 12.9  12.9  HCT 39.9  39.9  MCV 94.8  94.5  PLT 206  209   Basic Metabolic Panel: Recent Labs  Lab 04/22/20 1431  NA 140  K 4.3  CL 105  CO2 24  GLUCOSE 107*  BUN 18  CREATININE 0.77  CALCIUM 9.3   GFR: Estimated Creatinine Clearance: 64.8 mL/min (by C-G formula based on SCr of 0.77 mg/dL). Liver Function Tests: No results for input(s): AST, ALT, ALKPHOS, BILITOT, PROT, ALBUMIN in the last 168 hours. No results for input(s): LIPASE, AMYLASE in the last 168 hours. No results for input(s): AMMONIA in the last 168 hours. Coagulation Profile: No results for input(s): INR, PROTIME in the last 168  hours. Cardiac Enzymes: No results for input(s): CKTOTAL, CKMB, CKMBINDEX, TROPONINI in the last 168 hours. BNP (last 3 results) No results for input(s): PROBNP in the last 8760 hours. HbA1C: No results for input(s): HGBA1C in the last 72 hours. CBG: No results for input(s): GLUCAP in the last 168 hours. Lipid Profile: No results for input(s): CHOL, HDL, LDLCALC, TRIG, CHOLHDL, LDLDIRECT in the last 72 hours. Thyroid Function Tests: No results for input(s): TSH, T4TOTAL, FREET4, T3FREE, THYROIDAB in the last 72 hours. Anemia Panel: No results for input(s): VITAMINB12, FOLATE, FERRITIN, TIBC, IRON, RETICCTPCT in the last 72 hours. Urine analysis:    Component Value Date/Time   COLORURINE Yellow 09/27/2014 1820   APPEARANCEUR Hazy 09/27/2014 1820   LABSPEC 1.025 09/27/2014 1820   PHURINE 5.0 09/27/2014 1820   GLUCOSEU Negative 09/27/2014 1820   HGBUR 1+ 09/27/2014 1820   BILIRUBINUR 1+ 09/27/2014 1820   KETONESUR Negative 09/27/2014 1820   PROTEINUR 100 mg/dL 31/54/0086 7619   NITRITE Negative 09/27/2014 1820   LEUKOCYTESUR Negative 09/27/2014 1820    Radiological Exams on Admission: DG Chest Portable 1 View  Result Date: 04/22/2020 CLINICAL DATA:  Shortness of breath EXAM: PORTABLE CHEST 1 VIEW COMPARISON:  Chest radiograph dated 01/31/2020 FINDINGS: The heart is normal in size. Vascular calcifications are seen in the aortic arch. There are mild bilateral lower lung predominant interstitial opacities. There is no pleural effusion or pneumothorax. Degenerative changes are seen in the spine and shoulders. IMPRESSION: Mild bilateral lower lung predominant interstitial opacities may represent pulmonary edema or atypical infection. Electronically Signed   By: Romona Curls M.D.   On: 04/22/2020 15:15      Assessment/Plan  Acute COPD exacerbation  Patient with dyspnea, increasing changes to sputum.  We will start IV Rocephin and azithromycin. Continue daily IV  Solu-Medrol Scheduled albuterol nebulizer every 6 hrs Incentive spirometry  Lower extremity edema Possibly due to venous insufficiency.  BNP is low and patient is asymptomatic with any cardiac issues to suggest that this is cardiac related. Will try one-time dose of IV 20 mg Lasix and monitor her edema Check Is and Os   Questionable right lower extremity cellulitis Patient has bilateral lower extremity edema with chronic venous stasis changes of the skin but right lower extremity appears to be more erythematous and increase in warmth compared to the left She  is already on IV antibiotics for COPD exacerbation.  We will monitor this area.  Hypertension continue metoprolol  unsure if patient has tried other antihypertensives in the past but beta-blocker does not appear to be a great choice for her with COPD and frequent exacerbations  DVT prophylaxis:.Lovenox Code Status: Full Family Communication: Plan discussed with patient at bedside  disposition Plan: Home with observation Consults called:  Admission status: Observation  Status is: Observation  The patient remains OBS appropriate and will d/c before 2 midnights.  Dispo: The patient is from: Home              Anticipated d/c is to: Home              Anticipated d/c date is: 1 day              Patient currently is not medically stable to d/c.         Anselm Jungling DO [facility]   If 7PM-7AM, please contact night-coverage [email or web address]   04/22/2020, 7:35 PM

## 2020-04-22 NOTE — ED Triage Notes (Signed)
Pt arrived via [facility] from home with reports of shortness of breath, on arrival, pt unable to speak in complete sentences, pt states she has been short of breath since 4 am. Pt has been using nebs and multiple hits on her inhaler with no relief.  In July pt was on prednisone taper.  Pt concerned that this is related to allergies, but has hx of COPD.  Pt states in 2013 she was on a vent x 7 days with respiratory failure and is MRSA + in nares.

## 2020-04-22 NOTE — ED Notes (Signed)
First Nurse Note: Pt to ED for increased ShOB. Has appt with allergy doc tomorrow. Pt is in NAD.

## 2020-04-22 NOTE — ED Provider Notes (Signed)
[facility] Emergency Department Provider Note    First MD Initiated Contact with Patient 04/22/20 1509     (approximate)  I have reviewed the triage vital signs and the nursing notes.   HISTORY  Chief Complaint Shortness of Breath    HPI Madison [name] is a 69 y.o. female below listed past medical history presents to the ER for several days of progressively worsening shortness of breath and wheezing.  Does not wear home oxygen.  Was placed on some steroid courses over the past several months feels like she will get some improvement but after she stops quickly becomes more short of breath.  Does have albuterol as well as Mucomyst at home.  Has been following up with pulmonology as an outpatient.  She denies any chest pain.  Denies any orthopnea.  Has significant and severe exertional dyspnea.  No measured fevers. did not get her  Covid vaccination.   Past Medical History:  Diagnosis Date  . Anemia   . Arthritis   . Blood transfusion without reported diagnosis   . Chronic kidney disease 05/22/2016   urinary retention  has catheter     . Edema extremities   . Hyperlipidemia   . Peripheral vascular disease (HCC)   . Pneumonia 11/2013   Hx. pneumonia     . Seasonal allergies   . Tachycardia    Family History  Problem Relation Age of Onset  . Heart disease Mother   . Hypertension Mother   . Hyperlipidemia Mother   . Diabetes Brother    Past Surgical History:  Procedure Laterality Date  . ANKLE SURGERY Right   . APPENDECTOMY    . BACK SURGERY  05/2016  . CARPAL TUNNEL RELEASE Left   . CHOLECYSTECTOMY    . TONSILLECTOMY    . TUBAL LIGATION    . VEIN LIGATION AND STRIPPING     Patient Active Problem List   Diagnosis Date Noted  . Spondylolisthesis of lumbar region 05/26/2016  . Facet arthritis of cervical region 03/05/2015  . DDD (degenerative disc disease), lumbar 03/05/2015      Prior to Admission medications   Medication Sig Start  Date End Date Taking? Authorizing Provider  acetylcysteine (MUCOMYST) 20 % nebulizer solution Take 4 mLs by nebulization every 4 (four) hours. 01/31/20  Yes Phineas Semen, MD  albuterol (PROVENTIL) (2.5 MG/3ML) 0.083% nebulizer solution SMARTSIG:3 Milliliter(s) Via Nebulizer Every 6 Hours PRN 04/17/20  Yes [provider]  benzonatate (TESSALON) 100 MG capsule Take by mouth 3 (three) times daily as needed for cough.   Yes [provider]  bisacodyl (DULCOLAX) 5 MG EC tablet Take 5 mg by mouth daily as needed for moderate constipation.   Yes [provider]  Cholecalciferol (VITAMIN D3 PO) Take by mouth daily.   Yes [provider]  cyanocobalamin (V-R VITAMIN B-12) 500 MCG tablet Take 500 mcg by mouth daily.    Yes [provider]  dextromethorphan-guaiFENesin (MUCINEX DM) 30-600 MG 12hr tablet Take 1 tablet by mouth 2 (two) times daily.   Yes [provider]  fluticasone (FLONASE) 50 MCG/ACT nasal spray Place into both nostrils daily.   Yes [provider]  loratadine-pseudoephedrine (CLARITIN-D 12-HOUR) 5-120 MG per tablet Take 1 tablet by mouth daily.    Yes [provider]  metoprolol (LOPRESSOR) 50 MG tablet Take 50 mg by mouth 2 (two) times daily.  02/26/16  Yes [provider]  naproxen (NAPROSYN) 500 MG tablet Take 500 mg  by mouth 2 (two) times daily as needed.   Yes [provider]  zinc sulfate 220 (50 Zn) MG capsule Take 220 mg by mouth daily.   Yes [provider]    Allergies Eggs or egg-derived products, Morphine and related, Fentanyl, Spiriva handihaler [tiotropium bromide monohydrate], Sulfa antibiotics, Penicillins, and Voltaren [diclofenac]    Social History Social History   Tobacco Use  . Smoking status: Current Every Day Smoker    Packs/day: 0.25    Years: 0.50    Pack years: 0.12    Types: Cigarettes  . Smokeless tobacco: Never Used  . Tobacco comment: 4- 5 cigs per  day.   Substance Use Topics  . Alcohol use: Yes    Alcohol/week: 0.0 standard drinks    Comment: ocasionally  glass wine  . Drug use: No    Review of Systems Patient denies headaches, rhinorrhea, blurry vision, numbness, shortness of breath, chest pain, edema, cough, abdominal pain, nausea, vomiting, diarrhea, dysuria, fevers, rashes or hallucinations unless otherwise stated above in HPI. ____________________________________________   PHYSICAL EXAM:  VITAL SIGNS: Vitals:   04/22/20 1623 04/22/20 1641  BP: 128/78 119/64  Pulse: 91 85  Resp: (!) 26 (!) 26  Temp:    SpO2: 97% 99%    Constitutional: Alert and oriented.  Eyes: Conjunctivae are normal.  Head: Atraumatic. Nose: No congestion/rhinnorhea. Mouth/Throat: Mucous membranes are moist.   Neck: No stridor. Painless ROM.  Cardiovascular: Normal rate, regular rhythm. Grossly normal heart sounds.  Good peripheral circulation. Respiratory: tachypnea with coarse diffuse expiratory wheeze Gastrointestinal: Soft and nontender. No distention. No abdominal bruits. No CVA tenderness. Genitourinary: deferred Musculoskeletal: No lower extremity tenderness, 1+ BLE edema.  No joint effusions. Neurologic:  Normal speech and language. No gross focal neurologic deficits are appreciated. No facial droop Skin:  Skin is warm, dry and intact. No rash noted. Psychiatric: Mood and affect are normal. Speech and behavior are normal.  ____________________________________________   LABS (all labs ordered are listed, but only abnormal results are displayed)  Results for orders placed or performed during the hospital encounter of 04/22/20 (from the past 24 hour(s))  Basic metabolic panel     Status: Abnormal   Collection Time: 04/22/20  2:31 PM  Result Value Ref Range   Sodium 140 135 - 145 mmol/L   Potassium 4.3 3.5 - 5.1 mmol/L   Chloride 105 98 - 111 mmol/L   CO2 24 22 - 32 mmol/L   Glucose, Bld 107 (H) 70 - 99 mg/dL   BUN 18 8 - 23  mg/dL   Creatinine, Ser 5.620.77 0.44 - 1.00 mg/dL   Calcium 9.3 8.9 - 13.010.3 mg/dL   GFR calc non Af Amer >60 >60 mL/min   GFR calc Af Amer >60 >60 mL/min   Anion gap 11 5 - 15  CBC     Status: None   Collection Time: 04/22/20  2:31 PM  Result Value Ref Range   WBC 8.6 4.0 - 10.5 K/uL   RBC 4.22 3.87 - 5.11 MIL/uL   Hemoglobin 12.9 12.0 - 15.0 g/dL   HCT 86.539.9 36 - 46 %   MCV 94.5 80.0 - 100.0 fL   MCH 30.6 26.0 - 34.0 pg   MCHC 32.3 30.0 - 36.0 g/dL   RDW 78.413.6 69.611.5 - 29.515.5 %   Platelets 209 150 - 400 K/uL   nRBC 0.0 0.0 - 0.2 %  Troponin I (High Sensitivity)     Status: None   Collection Time: 04/22/20  2:31 PM  Result Value Ref Range   Troponin I (High Sensitivity) <2 <18 ng/L  CBC with Differential     Status: Abnormal   Collection Time: 04/22/20  2:31 PM  Result Value Ref Range   WBC 8.4 4.0 - 10.5 K/uL   RBC 4.21 3.87 - 5.11 MIL/uL   Hemoglobin 12.9 12.0 - 15.0 g/dL   HCT 10.1 36 - 46 %   MCV 94.8 80.0 - 100.0 fL   MCH 30.6 26.0 - 34.0 pg   MCHC 32.3 30.0 - 36.0 g/dL   RDW 75.1 02.5 - 85.2 %   Platelets 206 150 - 400 K/uL   nRBC 0.0 0.0 - 0.2 %   Neutrophils Relative % 61 %   Neutro Abs 5.1 1.7 - 7.7 K/uL   Lymphocytes Relative 21 %   Lymphs Abs 1.8 0.7 - 4.0 K/uL   Monocytes Relative 7 %   Monocytes Absolute 0.6 0 - 1 K/uL   Eosinophils Relative 9 %   Eosinophils Absolute 0.8 (H) 0 - 0 K/uL   Basophils Relative 1 %   Basophils Absolute 0.1 0 - 0 K/uL   Immature Granulocytes 1 %   Abs Immature Granulocytes 0.06 0.00 - 0.07 K/uL  Brain natriuretic peptide     Status: None   Collection Time: 04/22/20  2:31 PM  Result Value Ref Range   B Natriuretic Peptide 58.3 0.0 - 100.0 pg/mL   ____________________________________________  EKG My review and personal interpretation at Time: 14:36   Indication: sob  Rate: 70  Rhythm: sinus Axis: normal Other: nonspecifric st abn, no stemi, no depression ____________________________________________  RADIOLOGY  I personally  reviewed all radiographic images ordered to evaluate for the above acute complaints and reviewed radiology reports and findings.  These findings were personally discussed with the patient.  Please see medical record for radiology report.  ____________________________________________   PROCEDURES  Procedure(s) performed:  Procedures    Critical Care performed: no ____________________________________________   INITIAL IMPRESSION / ASSESSMENT AND PLAN / ED COURSE  Pertinent labs & imaging results that were available during my care of the patient were reviewed by me and considered in my medical decision making (see chart for details).   DDX: covid19, Asthma, copd, CHF, pna, ptx, malignancy, Pe, anemia   Madison [name] is a 69 y.o. who presents to the ED with symptoms as described above.  Patient with diffuse wheezing and mild respiratory distress placed on supplemental oxygen doing due to desaturations into the high 80s.  Was given nebulizer as well as steroids given concern for COPD exacerbation.  Was given IV Solu-Medrol.  Does not seem to be having signs of ACS or CHF.  Patient with significant improvement after nebulizer treatments but still with persistent wheezing tachypnea in the high 20s.  Still speaking in short phrases.  Will give additional nebulizer treatment.  Based on her presentation I do believe she would benefit from observation for further nebulizer treatments and medical management.  Have discussed with the patient and available family all diagnostics and treatments performed thus far and all questions were answered to the best of my ability. The patient demonstrates understanding and agreement with plan.      The patient was evaluated in Emergency Department today for the symptoms described in the history of present illness. He/she was evaluated in the context of the global COVID-19 pandemic, which necessitated consideration that the patient might be at risk for  infection with the SARS-CoV-2 virus that causes COVID-19.  Institutional protocols and algorithms that pertain to the evaluation of patients at risk for COVID-19 are in a state of rapid change based on information released by regulatory bodies including the CDC and federal and state organizations. These policies and algorithms were followed during the patient's care in the ED.  As part of my medical decision making, I reviewed the following data within the electronic MEDICAL RECORD NUMBER Nursing notes reviewed and incorporated, Labs reviewed, notes from prior ED visits and  Controlled Substance Database   ____________________________________________   FINAL CLINICAL IMPRESSION(S) / ED DIAGNOSES  Final diagnoses:  COPD exacerbation (HCC)      NEW MEDICATIONS STARTED DURING THIS VISIT:  New Prescriptions   No medications on file     Note:  This document was prepared using Dragon voice recognition software and may include unintentional dictation errors.    Willy Eddy, MD 04/22/20 [phone]

## 2020-04-23 DIAGNOSIS — J441 Chronic obstructive pulmonary disease with (acute) exacerbation: Secondary | ICD-10-CM

## 2020-04-23 DIAGNOSIS — I10 Essential (primary) hypertension: Secondary | ICD-10-CM

## 2020-04-23 DIAGNOSIS — R600 Localized edema: Secondary | ICD-10-CM

## 2020-04-23 LAB — HIV ANTIBODY (ROUTINE TESTING W REFLEX): HIV Screen 4th Generation wRfx: NONREACTIVE

## 2020-04-23 MED ORDER — TRIAMTERENE-HCTZ 37.5-25 MG PO TABS
1.0000 | ORAL_TABLET | Freq: Every day | ORAL | Status: DC
  Filled 2020-04-23: qty 1

## 2020-04-23 MED ORDER — PREDNISONE 10 MG (21) PO TBPK
ORAL_TABLET | ORAL | 0 refills | Status: DC
Start: 2020-04-23 — End: 2020-05-14

## 2020-04-23 MED ORDER — DOXYCYCLINE HYCLATE 100 MG PO TBEC: 100.0000 mg | DELAYED_RELEASE_TABLET | Freq: Two times a day (BID) | ORAL | 0 refills | Status: AC

## 2020-04-23 NOTE — ED Notes (Signed)
Pt readjusted in bed, purewick back in place, given water and medications, patient denies other needs at this time.  Resting comfortably with monitoring cords and call bell in place.

## 2020-04-25 NOTE — Discharge Summary (Signed)
5        [place] at [facility]   PATIENT NAME: Madison [name]    MR#:  [id number]  DATE OF BIRTH:  28-Dec-1950  DATE OF ADMISSION:  04/22/2020   ADMITTING PHYSICIAN: Anselm Jungling, DO  DATE OF DISCHARGE: 04/23/2020 11:50 AM  PRIMARY CARE PHYSICIAN: Dione Housekeeper, MD   ADMISSION DIAGNOSIS:  COPD exacerbation (HCC) [J44.1] DISCHARGE DIAGNOSIS:  Principal Problem:   COPD exacerbation (HCC) Active Problems:   Lower extremity edema   Essential hypertension  SECONDARY DIAGNOSIS:   Past Medical History:  Diagnosis Date  . Anemia   . Arthritis   . Blood transfusion without reported diagnosis   . Chronic kidney disease 05/22/2016   urinary retention  has catheter     . Edema extremities   . Hyperlipidemia   . Peripheral vascular disease (HCC)   . Pneumonia 11/2013   Hx. pneumonia     . Seasonal allergies   . Tachycardia    HOSPITAL COURSE:  69 year old female with a known history of hypertension, COPD, MRSA pneumonia, morbid obesity admitted for COPD exacerbation.  Acute COPD exacerbation  Empirically treated with IV Rocephin and azithromycin. Also given IV Solu-Medrol and Scheduled albuterol nebulizer every 6 hrs Incentive spirometry She was feeling much better and requested discharge home.  She did not require oxygen at this time.  Lower extremity edema Possibly due to venous insufficiency.  BNP is low and patient is asymptomatic with any cardiac issues to suggest that this is cardiac related. one-time dose of IV 20 mg Lasix helped some.  right lower extremity cellulitis -ruled out Patient has bilateral lower extremity edema with chronic venous stasis changes of the skin   Hypertension Controlled at this time.  Adjust medication as an outpatient as needed   DISCHARGE CONDITIONS:  Stable CONSULTS OBTAINED:   DRUG ALLERGIES:   Allergies  Allergen Reactions  . Eggs Or Egg-Derived Products Anaphylaxis    Egg albumin  . Morphine And Related      unknown  . Fentanyl   . Spiriva Handihaler [Tiotropium Bromide Monohydrate] Other (See Comments)    bronchiospasms  . Sulfa Antibiotics Hives  . Penicillins Rash    Has patient had a PCN reaction causing immediate rash, facial/tongue/throat swelling, SOB or lightheadedness with hypotension: YES Has patient had a PCN reaction causing severe rash involving mucus membranes or skin necrosis: NO Has patient had a PCN reaction that required hospitalization NO Has patient had a PCN reaction occurring within the last 10 years: NO If all of the above answers are "NO", then may proceed with Cephalosporin use.  . Voltaren [Diclofenac] Rash   DISCHARGE MEDICATIONS:   Allergies as of 04/23/2020      Reactions   Eggs Or Egg-derived Products Anaphylaxis   Egg albumin   Morphine And Related    unknown   Fentanyl    Spiriva Handihaler [tiotropium Bromide Monohydrate] Other (See Comments)   bronchiospasms   Sulfa Antibiotics Hives   Penicillins Rash   Has patient had a PCN reaction causing immediate rash, facial/tongue/throat swelling, SOB or lightheadedness with hypotension: YES Has patient had a PCN reaction causing severe rash involving mucus membranes or skin necrosis: NO Has patient had a PCN reaction that required hospitalization NO Has patient had a PCN reaction occurring within the last 10 years: NO If all of the above answers are "NO", then may proceed with Cephalosporin use.   Voltaren [diclofenac] Rash      Medication List  TAKE these medications   acetylcysteine 20 % nebulizer solution Commonly known as: MUCOMYST Take 4 mLs by nebulization every 4 (four) hours.   albuterol (2.5 MG/3ML) 0.083% nebulizer solution Commonly known as: PROVENTIL SMARTSIG:3 Milliliter(s) Via Nebulizer Every 6 Hours PRN   benzonatate 100 MG capsule Commonly known as: TESSALON Take by mouth 3 (three) times daily as needed for cough.   bisacodyl 5 MG EC tablet Commonly known as: DULCOLAX Take  5 mg by mouth daily as needed for moderate constipation.   dextromethorphan-guaiFENesin 30-600 MG 12hr tablet Commonly known as: MUCINEX DM Take 1 tablet by mouth 2 (two) times daily.   doxycycline 100 MG EC tablet Commonly known as: DORYX Take 1 tablet (100 mg total) by mouth 2 (two) times daily for 5 days.   fluticasone 50 MCG/ACT nasal spray Commonly known as: FLONASE Place into both nostrils daily.   loratadine-pseudoephedrine 5-120 MG tablet Commonly known as: CLARITIN-D 12-hour Take 1 tablet by mouth daily.   metoprolol tartrate 50 MG tablet Commonly known as: LOPRESSOR Take 50 mg by mouth 2 (two) times daily.   naproxen 500 MG tablet Commonly known as: NAPROSYN Take 500 mg by mouth 2 (two) times daily as needed.   predniSONE 10 MG (21) Tbpk tablet Commonly known as: STERAPRED UNI-PAK 21 TAB Start 60 mg po daily, taper 10 mg daily until done   V-R VITAMIN B-12 500 MCG tablet Generic drug: vitamin B-12 Take 500 mcg by mouth daily.   VITAMIN D3 PO Take by mouth daily.   zinc sulfate 220 (50 Zn) MG capsule Take 220 mg by mouth daily.      DISCHARGE INSTRUCTIONS:   DIET:  Cardiac diet DISCHARGE CONDITION:  Stable ACTIVITY:  Activity as tolerated OXYGEN:  Home Oxygen: No.  Oxygen Delivery: room air DISCHARGE LOCATION:  home   If you experience worsening of your admission symptoms, develop shortness of breath, life threatening emergency, suicidal or homicidal thoughts you must seek medical attention immediately by calling 911 or calling your MD immediately  if symptoms less severe.  You Must read complete instructions/literature along with all the possible adverse reactions/side effects for all the Medicines you take and that have been prescribed to you. Take any new Medicines after you have completely understood and accpet all the possible adverse reactions/side effects.   Please note  You were cared for by a hospitalist during your hospital stay. If you  have any questions about your discharge medications or the care you received while you were in the hospital after you are discharged, you can call the unit and asked to speak with the hospitalist on call if the hospitalist that took care of you is not available. Once you are discharged, your primary care physician will handle any further medical issues. Please note that NO REFILLS for any discharge medications will be authorized once you are discharged, as it is imperative that you return to your primary care physician (or establish a relationship with a primary care physician if you do not have one) for your aftercare needs so that they can reassess your need for medications and monitor your lab values.    On the day of Discharge:  VITAL SIGNS:  Blood pressure (!) 152/72, pulse 92, temperature 98.8 F (37.1 C), temperature source Oral, resp. rate 20, height 4' 11.5" (1.511 m), weight 92.5 kg, SpO2 94 %. PHYSICAL EXAMINATION:  GENERAL:  69 y.o.-year-old patient lying in the bed with no acute distress.  EYES: Pupils equal, round, reactive to  light and accommodation. No scleral icterus. Extraocular muscles intact.  HEENT: Head atraumatic, normocephalic. Oropharynx and nasopharynx clear.  NECK:  Supple, no jugular venous distention. No thyroid enlargement, no tenderness.  LUNGS: Normal breath sounds bilaterally, no wheezing, rales,rhonchi or crepitation. No use of accessory muscles of respiration.  CARDIOVASCULAR: S1, S2 normal. No murmurs, rubs, or gallops.  ABDOMEN: Soft, non-tender, non-distended. Bowel sounds present. No organomegaly or mass.  EXTREMITIES: No pedal edema, cyanosis, or clubbing.  NEUROLOGIC: Cranial nerves II through XII are intact. Muscle strength 5/5 in all extremities. Sensation intact. Gait not checked.  PSYCHIATRIC: The patient is alert and oriented x 3.  SKIN: No obvious rash, lesion, or ulcer.  DATA REVIEW:   CBC Recent Labs  Lab 04/22/20 1431  WBC 8.4  8.6  HGB  12.9  12.9  HCT 39.9  39.9  PLT 206  209    Chemistries  Recent Labs  Lab 04/22/20 1431  NA 140  K 4.3  CL 105  CO2 24  GLUCOSE 107*  BUN 18  CREATININE 0.77  CALCIUM 9.3     Outpatient follow-up  Follow-up Information    Dione Housekeeper, MD. Schedule an appointment as soon as possible for a visit in 1 day(s).   Specialty: Family Medicine Contact information: [address] [place] [address] [phone]                Management plans discussed with the patient, family and they are in agreement.  CODE STATUS: Prior   TOTAL TIME TAKING CARE OF THIS PATIENT: 45 minutes.    Delfino Lovett M.D on 04/25/2020 at 11:46 AM  [facility]   CC: Primary care physician; Dione Housekeeper, MD   Note: This dictation was prepared with Dragon dictation along with smaller phrase technology. Any transcriptional errors that result from this process are unintentional.

## 2020-05-11 ENCOUNTER — Other Ambulatory Visit: Payer: Self-pay

## 2020-05-11 ENCOUNTER — Emergency Department: Payer: Medicare Other

## 2020-05-11 ENCOUNTER — Inpatient Hospital Stay
Admission: EM | Admit: 2020-05-11 | Discharge: 2020-05-14 | DRG: 190 | Disposition: A | Discharge status: Home / Self Care | Payer: Medicare Other | Attending: Internal Medicine | Admitting: Internal Medicine

## 2020-05-11 DIAGNOSIS — Z91048 Other nonmedicinal substance allergy status: Secondary | ICD-10-CM

## 2020-05-11 DIAGNOSIS — R0602 Shortness of breath: Secondary | ICD-10-CM | POA: Diagnosis not present

## 2020-05-11 DIAGNOSIS — R000 Tachycardia, unspecified: Secondary | ICD-10-CM

## 2020-05-11 DIAGNOSIS — Z91012 Allergy to eggs: Secondary | ICD-10-CM

## 2020-05-11 DIAGNOSIS — J984 Other disorders of lung: Secondary | ICD-10-CM | POA: Diagnosis present

## 2020-05-11 DIAGNOSIS — J441 Chronic obstructive pulmonary disease with (acute) exacerbation: Principal | ICD-10-CM | POA: Diagnosis present

## 2020-05-11 DIAGNOSIS — J329 Chronic sinusitis, unspecified: Secondary | ICD-10-CM | POA: Diagnosis present

## 2020-05-11 DIAGNOSIS — J9601 Acute respiratory failure with hypoxia: Secondary | ICD-10-CM | POA: Diagnosis present

## 2020-05-11 DIAGNOSIS — Z9851 Tubal ligation status: Secondary | ICD-10-CM

## 2020-05-11 DIAGNOSIS — F17210 Nicotine dependence, cigarettes, uncomplicated: Secondary | ICD-10-CM | POA: Diagnosis present

## 2020-05-11 DIAGNOSIS — Z8249 Family history of ischemic heart disease and other diseases of the circulatory system: Secondary | ICD-10-CM

## 2020-05-11 DIAGNOSIS — Z9049 Acquired absence of other specified parts of digestive tract: Secondary | ICD-10-CM

## 2020-05-11 DIAGNOSIS — Z79899 Other long term (current) drug therapy: Secondary | ICD-10-CM

## 2020-05-11 DIAGNOSIS — R739 Hyperglycemia, unspecified: Secondary | ICD-10-CM | POA: Diagnosis present

## 2020-05-11 DIAGNOSIS — R600 Localized edema: Secondary | ICD-10-CM | POA: Diagnosis present

## 2020-05-11 DIAGNOSIS — Z882 Allergy status to sulfonamides status: Secondary | ICD-10-CM

## 2020-05-11 DIAGNOSIS — Z20822 Contact with and (suspected) exposure to covid-19: Secondary | ICD-10-CM | POA: Diagnosis present

## 2020-05-11 DIAGNOSIS — Z885 Allergy status to narcotic agent status: Secondary | ICD-10-CM

## 2020-05-11 DIAGNOSIS — M1990 Unspecified osteoarthritis, unspecified site: Secondary | ICD-10-CM | POA: Diagnosis present

## 2020-05-11 DIAGNOSIS — I10 Essential (primary) hypertension: Secondary | ICD-10-CM | POA: Diagnosis present

## 2020-05-11 DIAGNOSIS — I739 Peripheral vascular disease, unspecified: Secondary | ICD-10-CM | POA: Diagnosis present

## 2020-05-11 DIAGNOSIS — E872 Acidosis, unspecified: Secondary | ICD-10-CM

## 2020-05-11 DIAGNOSIS — E785 Hyperlipidemia, unspecified: Secondary | ICD-10-CM | POA: Diagnosis present

## 2020-05-11 DIAGNOSIS — Z880 Allergy status to penicillin: Secondary | ICD-10-CM

## 2020-05-11 DIAGNOSIS — T380X5A Adverse effect of glucocorticoids and synthetic analogues, initial encounter: Secondary | ICD-10-CM | POA: Diagnosis present

## 2020-05-11 DIAGNOSIS — Z888 Allergy status to other drugs, medicaments and biological substances status: Secondary | ICD-10-CM

## 2020-05-11 DIAGNOSIS — B965 Pseudomonas (aeruginosa) (mallei) (pseudomallei) as the cause of diseases classified elsewhere: Secondary | ICD-10-CM | POA: Diagnosis present

## 2020-05-11 DIAGNOSIS — R778 Other specified abnormalities of plasma proteins: Secondary | ICD-10-CM

## 2020-05-11 DIAGNOSIS — R609 Edema, unspecified: Secondary | ICD-10-CM

## 2020-05-11 DIAGNOSIS — I872 Venous insufficiency (chronic) (peripheral): Secondary | ICD-10-CM | POA: Diagnosis present

## 2020-05-11 LAB — CBC
HCT: 41.5 % (ref 36.0–46.0)
Hemoglobin: 13.4 g/dL (ref 12.0–15.0)
MCH: 30.9 pg (ref 26.0–34.0)
MCHC: 32.3 g/dL (ref 30.0–36.0)
MCV: 95.6 fL (ref 80.0–100.0)
Platelets: 220 10*3/uL (ref 150–400)
RBC: 4.34 MIL/uL (ref 3.87–5.11)
RDW: 13.5 % (ref 11.5–15.5)
WBC: 9.7 10*3/uL (ref 4.0–10.5)
nRBC: 0 % (ref 0.0–0.2)

## 2020-05-11 LAB — TROPONIN I (HIGH SENSITIVITY)
Troponin I (High Sensitivity): 3 ng/L (ref <18)
Troponin I (High Sensitivity): 3 ng/L (ref <18)

## 2020-05-11 LAB — BASIC METABOLIC PANEL
Anion gap: 10 (ref 5–15)
BUN: 15 mg/dL (ref 8–23)
CO2: 30 mmol/L (ref 22–32)
Calcium: 9.1 mg/dL (ref 8.9–10.3)
Chloride: 98 mmol/L (ref 98–111)
Creatinine, Ser: 0.84 mg/dL (ref 0.44–1.00)
GFR calc Af Amer: >60 mL/min (ref >60)
GFR calc non Af Amer: >60 mL/min (ref >60)
Glucose, Bld: 174 mg/dL — ABNORMAL HIGH (ref 70–99)
Potassium: 3.8 mmol/L (ref 3.5–5.1)
Sodium: 138 mmol/L (ref 135–145)

## 2020-05-11 LAB — LACTIC ACID, PLASMA: Lactic Acid, Venous: 5.7 mmol/L (ref 0.5–1.9)

## 2020-05-11 MED ORDER — BISACODYL 5 MG PO TBEC: 5.0000 mg | DELAYED_RELEASE_TABLET | Freq: Every day | ORAL | Status: DC | PRN

## 2020-05-11 MED ORDER — METOPROLOL TARTRATE 50 MG PO TABS
50.0000 mg | ORAL_TABLET | Freq: Two times a day (BID) | ORAL | Status: DC
  Administered 2020-05-12 – 2020-05-14 (×6): 50 mg via ORAL
  Filled 2020-05-11 (×6): qty 1

## 2020-05-11 MED ORDER — SODIUM CHLORIDE 0.9 % IV SOLN
100.0000 mg | Freq: Once | INTRAVENOUS | Status: AC
  Administered 2020-05-12: 100 mg via INTRAVENOUS
  Filled 2020-05-11: qty 100

## 2020-05-11 MED ORDER — ENOXAPARIN SODIUM 40 MG/0.4ML ~~LOC~~ SOLN
40.0000 mg | SUBCUTANEOUS | Status: DC
  Administered 2020-05-12 – 2020-05-14 (×3): 40 mg via SUBCUTANEOUS
  Filled 2020-05-11 (×3): qty 0.4

## 2020-05-11 MED ORDER — IPRATROPIUM BROMIDE 0.02 % IN SOLN
0.5000 mg | Freq: Once | RESPIRATORY_TRACT | Status: AC
  Administered 2020-05-11: 0.5 mg via RESPIRATORY_TRACT
  Filled 2020-05-11: qty 2.5

## 2020-05-11 MED ORDER — ALBUTEROL SULFATE (2.5 MG/3ML) 0.083% IN NEBU
10.0000 mg | INHALATION_SOLUTION | Freq: Once | RESPIRATORY_TRACT | Status: AC
  Administered 2020-05-11: 10 mg via RESPIRATORY_TRACT
  Filled 2020-05-11: qty 12

## 2020-05-11 MED ORDER — CEFDINIR 300 MG PO CAPS
300.0000 mg | ORAL_CAPSULE | Freq: Two times a day (BID) | ORAL | Status: DC
  Administered 2020-05-12 – 2020-05-14 (×5): 300 mg via ORAL
  Filled 2020-05-11 (×9): qty 1

## 2020-05-11 MED ORDER — ALBUTEROL SULFATE HFA 108 (90 BASE) MCG/ACT IN AERS
1.0000 | INHALATION_SPRAY | Freq: Once | RESPIRATORY_TRACT | Status: AC
  Administered 2020-05-11: 2 via RESPIRATORY_TRACT
  Filled 2020-05-11: qty 6.7

## 2020-05-11 MED ORDER — GUAIFENESIN ER 600 MG PO TB12
600.0000 mg | ORAL_TABLET | Freq: Two times a day (BID) | ORAL | Status: DC
  Administered 2020-05-12 – 2020-05-14 (×6): 600 mg via ORAL
  Filled 2020-05-11 (×6): qty 1

## 2020-05-11 MED ORDER — ACETAMINOPHEN 650 MG RE SUPP: 650.0000 mg | Freq: Four times a day (QID) | RECTAL | Status: DC | PRN

## 2020-05-11 MED ORDER — IPRATROPIUM-ALBUTEROL 0.5-2.5 (3) MG/3ML IN SOLN: 3.0000 mL | Freq: Four times a day (QID) | RESPIRATORY_TRACT | Status: DC

## 2020-05-11 MED ORDER — LACTATED RINGERS IV BOLUS
1000.0000 mL | Freq: Once | INTRAVENOUS | Status: AC
  Administered 2020-05-11: 1000 mL via INTRAVENOUS

## 2020-05-11 MED ORDER — METHYLPREDNISOLONE SODIUM SUCC 125 MG IJ SOLR
125.0000 mg | Freq: Once | INTRAMUSCULAR | Status: AC
  Administered 2020-05-11: 125 mg via INTRAVENOUS
  Filled 2020-05-11: qty 2

## 2020-05-11 MED ORDER — METHYLPREDNISOLONE SODIUM SUCC 125 MG IJ SOLR
60.0000 mg | Freq: Two times a day (BID) | INTRAMUSCULAR | Status: DC
  Administered 2020-05-12 – 2020-05-13 (×4): 60 mg via INTRAVENOUS
  Filled 2020-05-11 (×4): qty 2

## 2020-05-11 MED ORDER — ACETAMINOPHEN 325 MG PO TABS
650.0000 mg | ORAL_TABLET | Freq: Four times a day (QID) | ORAL | Status: DC | PRN
  Administered 2020-05-12 – 2020-05-14 (×5): 650 mg via ORAL
  Filled 2020-05-11 (×5): qty 2

## 2020-05-11 MED ORDER — SORBITOL 70 % SOLN
30.0000 mL | Freq: Every day | Status: DC | PRN
  Filled 2020-05-11: qty 30

## 2020-05-11 MED ORDER — POLYETHYLENE GLYCOL 3350 17 G PO PACK: 17.0000 g | PACK | Freq: Every day | ORAL | Status: DC | PRN

## 2020-05-11 MED ORDER — FLUTICASONE PROPIONATE 50 MCG/ACT NA SUSP
2.0000 | Freq: Every day | NASAL | Status: DC
  Administered 2020-05-12 – 2020-05-14 (×3): 2 via NASAL
  Filled 2020-05-11 (×2): qty 16

## 2020-05-11 NOTE — ED Triage Notes (Addendum)
Pt comes into the ED via EMS from home with c/o increased SOB today, states she has been having issues for several months and has been seeing a specialist for her sx. Pt is able to speak in complete sentences with no distress noted.pt does havin audible wheezing, redness and swelling to her lower extremities

## 2020-05-11 NOTE — ED Notes (Signed)
Date and time results received: 05/11/20 2320  Test: lactic acid Critical Value: 5.7  Name of Provider Notified: Katrinka Blazing

## 2020-05-11 NOTE — ED Provider Notes (Signed)
[facility] Emergency Department Provider Note ____________________________________________   First MD Initiated Contact with Patient 05/11/20 2143     (approximate)  I have reviewed the triage vital signs and the nursing notes.  HISTORY  Chief Complaint Shortness of Breath   HPI Madison [name] is a 69 y.o. femalewho presents to the ED for evaluation of shortness of breath.  Chart review indicates patient was recently evaluated by [facility] ENT for choking spells and dysphagia.  Sinus cultures obtained during this visit returning positive for Pseudomonas and staph, so patient was prescribed 14-day course of Ceftin and doxycycline. Recently consulted with pulmonology on 8/24 history of tobacco abuse and possible COPD versus asthma.  Patient was started on Advair.   Patient presents to the ED with her daughter for evaluation of progressively worsening shortness of breath and productive cough.  Patient and daughter report a multiple month history of respiratory infections, treated with antibiotics/steroids with transient improvement, before recurrence and worsening.  They report frustration with this process and the uncertainty of patient not returning to her previous baseline.  Patient reports "couple days" of worsening shortness of breath and cough productive of thick clear sputum.  She reports this is increased due to production from her baseline.  She reports associated substernal chest pain, that is worse when she is coughing, and improved with her home nebulizers.  Reports the pain is 5/10 intensity, aching in nature and constant and nonradiating.  Patient reports not picking up the antibiotics from her ENT physician. Patient has not been vaccinated for COVID-19.  Past Medical History:  Diagnosis Date  . Anemia   . Arthritis   . Blood transfusion without reported diagnosis   . Chronic kidney disease 05/22/2016   urinary retention  has catheter     . Edema  extremities   . Hyperlipidemia   . Peripheral vascular disease (HCC)   . Pneumonia 11/2013   Hx. pneumonia     . Seasonal allergies   . Tachycardia     Patient Active Problem List   Diagnosis Date Noted  . Sinusitis 05/11/2020  . COPD exacerbation (HCC) 04/22/2020  . Lower extremity edema 04/22/2020  . Essential hypertension 04/22/2020  . Spondylolisthesis of lumbar region 05/26/2016  . Facet arthritis of cervical region 03/05/2015  . DDD (degenerative disc disease), lumbar 03/05/2015    Past Surgical History:  Procedure Laterality Date  . ANKLE SURGERY Right   . APPENDECTOMY    . BACK SURGERY  05/2016  . CARPAL TUNNEL RELEASE Left   . CHOLECYSTECTOMY    . TONSILLECTOMY    . TUBAL LIGATION    . VEIN LIGATION AND STRIPPING      Prior to Admission medications   Medication Sig Start Date End Date Taking? Authorizing Provider  albuterol (PROVENTIL) (2.5 MG/3ML) 0.083% nebulizer solution SMARTSIG:3 Milliliter(s) Via Nebulizer Every 6 Hours PRN 04/17/20  Yes [provider]  benzonatate (TESSALON) 100 MG capsule Take by mouth 3 (three) times daily as needed for cough.   Yes [provider]  cefUROXime (CEFTIN) 500 MG tablet Take 500 mg by mouth 2 (two) times daily. 05/11/20 05/25/20 Yes [provider]  Cholecalciferol (VITAMIN D3 PO) Take by mouth daily.   Yes [provider]  cyanocobalamin (V-R VITAMIN B-12) 500 MCG tablet Take 500 mcg by mouth daily.    Yes [provider]  dextromethorphan-guaiFENesin (MUCINEX DM) 30-600 MG 12hr tablet Take 2 tablets by mouth 2 (two) times daily.    Yes  [provider]  fluticasone (FLONASE) 50 MCG/ACT nasal spray Place 2 sprays into both nostrils daily.    Yes [provider]  fluticasone-salmeterol (ADVAIR HFA) 230-21 MCG/ACT inhaler Inhale 2 puffs into the lungs every 12 (twelve) hours. 04/27/20 04/27/21 Yes [provider]  furosemide (LASIX) 20 MG tablet Take 20 mg by  mouth daily.   Yes [provider]  metoprolol (LOPRESSOR) 50 MG tablet Take 50 mg by mouth 2 (two) times daily.  02/26/16  Yes [provider]  naproxen (NAPROSYN) 500 MG tablet Take 500 mg by mouth 2 (two) times daily as needed.   Yes [provider]  potassium chloride SA (KLOR-CON) 20 MEQ tablet Take 20 mEq by mouth daily.   Yes [provider]  predniSONE (STERAPRED UNI-PAK 21 TAB) 10 MG (21) TBPK tablet Start 60 mg po daily, taper 10 mg daily until done Patient not taking: Reported on 05/11/2020 04/23/20   Delfino Lovett, MD    Allergies Eggs or egg-derived products, Morphine and related, Fentanyl, Spiriva handihaler [tiotropium bromide monohydrate], Sulfa antibiotics, Penicillins, and Voltaren [diclofenac]  Family History  Problem Relation Age of Onset  . Heart disease Mother   . Hypertension Mother   . Hyperlipidemia Mother   . Diabetes Brother     Social History Social History   Tobacco Use  . Smoking status: Current Every Day Smoker    Packs/day: 0.25    Years: 0.50    Pack years: 0.12    Types: Cigarettes  . Smokeless tobacco: Never Used  . Tobacco comment: 4- 5 cigs per day.   Substance Use Topics  . Alcohol use: Yes    Alcohol/week: 0.0 standard drinks    Comment: ocasionally  glass wine  . Drug use: No    Review of Systems  Constitutional: No fever/chills Eyes: No visual changes. ENT: No sore throat. Cardiovascular: Positive for chest pain. Respiratory: Positive for shortness of breath and productive cough. Gastrointestinal: No abdominal pain.  No nausea, no vomiting.  No diarrhea.  No constipation. Genitourinary: Negative for dysuria. Musculoskeletal: Negative for back pain. Skin: Negative for rash. Neurological: Negative for headaches, focal weakness or numbness.  ____________________________________________   PHYSICAL EXAM:  VITAL SIGNS: Vitals:   05/11/20 2200 05/11/20 2230  BP: (!) 170/77 (!) 170/90  Pulse: (!)  115 (!) 119  Resp: (!) 22 (!) 25  Temp:    SpO2: 96% 94%      Constitutional: Alert and oriented.  Sitting up the at side of the bed tripoding in obvious distress.  Speaking in short phrases only. Eyes: Conjunctivae are normal. PERRL. EOMI. Head: Atraumatic. Nose: No congestion/rhinnorhea. Mouth/Throat: Mucous membranes are dry.  Oropharynx non-erythematous. Neck: No stridor. No cervical spine tenderness to palpation. Cardiovascular: Tachycardic rate, regular rhythm. Grossly normal heart sounds.  Good peripheral circulation. Respiratory: Tripod positioning.  Tachypneic to the upper 20s.  Poor air movement throughout with diffuse expiratory wheezes.  Gastrointestinal: Soft , nondistended, nontender to palpation. No abdominal bruits. No CVA tenderness. Musculoskeletal: .  No joint effusions. No signs of acute trauma. Pitting edema to bilateral lower extremities.  Neurologic:  Normal speech and language. No gross focal neurologic deficits are appreciated. No gait instability noted. Skin:  Skin is warm, dry and intact. No rash noted. Psychiatric: Mood and affect are normal. Speech and behavior are normal.  ____________________________________________   LABS (all labs ordered are listed, but only abnormal results are displayed)  Labs Reviewed  BASIC METABOLIC PANEL - Abnormal; Notable for the  following components:      Result Value   Glucose, Bld 174 (*)    All other components within normal limits  LACTIC ACID, PLASMA - Abnormal; Notable for the following components:   Lactic Acid, Venous 5.7 (*)    All other components within normal limits  SARS CORONAVIRUS 2 BY RT PCR (HOSPITAL ORDER, PERFORMED IN [facility])  CBC  CBC  CREATININE, SERUM  COMPREHENSIVE METABOLIC PANEL  CBC  HIV ANTIBODY (ROUTINE TESTING W REFLEX)  TROPONIN I (HIGH SENSITIVITY)  TROPONIN I (HIGH SENSITIVITY)   ____________________________________________  12 Lead EKG  Rhythm, rate of 86  bpm.  Normal axis and intervals.  No evidence of acute ischemia. ____________________________________________  RADIOLOGY  ED MD interpretation: CXR with hyperinflation without evidence of infiltration.  Official radiology report(s): DG Chest 2 View  Result Date: 05/11/2020 CLINICAL DATA:  Short of breath EXAM: CHEST - 2 VIEW COMPARISON:  04/22/2020 FINDINGS: Pulmonary hyperinflation. Lungs are clear without infiltrate effusion or edema. Heart size and vascularity normal. IMPRESSION: Pulmonary hyperinflation.  No acute abnormality. Electronically Signed   By: Marlan Palau M.D.   On: 05/11/2020 14:48    ____________________________________________   PROCEDURES and INTERVENTIONS  Procedure(s) performed (including Critical Care):  Procedures  Medications  doxycycline (VIBRAMYCIN) 100 mg in sodium chloride 0.9 % 250 mL IVPB (has no administration in time range)  enoxaparin (LOVENOX) injection 40 mg (has no administration in time range)  acetaminophen (TYLENOL) tablet 650 mg (has no administration in time range)    Or  acetaminophen (TYLENOL) suppository 650 mg (has no administration in time range)  polyethylene glycol (MIRALAX / GLYCOLAX) packet 17 g (has no administration in time range)  sorbitol 70 % solution 30 mL (has no administration in time range)  guaiFENesin (MUCINEX) 12 hr tablet 600 mg (has no administration in time range)  methylPREDNISolone sodium succinate (SOLU-MEDROL) 125 mg/2 mL injection 60 mg (has no administration in time range)  cefdinir (OMNICEF) capsule 300 mg (has no administration in time range)  metoprolol tartrate (LOPRESSOR) tablet 50 mg (has no administration in time range)  bisacodyl (DULCOLAX) EC tablet 5 mg (has no administration in time range)  fluticasone (FLONASE) 50 MCG/ACT nasal spray 2 spray (has no administration in time range)  albuterol (VENTOLIN HFA) 108 (90 Base) MCG/ACT inhaler 1-2 puff (2 puffs Inhalation Given 05/11/20 1852)    methylPREDNISolone sodium succinate (SOLU-MEDROL) 125 mg/2 mL injection 125 mg (125 mg Intravenous Given 05/11/20 2240)  albuterol (PROVENTIL) (2.5 MG/3ML) 0.083% nebulizer solution 10 mg (10 mg Nebulization Given 05/11/20 2239)  ipratropium (ATROVENT) nebulizer solution 0.5 mg (0.5 mg Nebulization Given 05/11/20 2237)  lactated ringers bolus 1,000 mL (1,000 mLs Intravenous New Bag/Given 05/11/20 2337)    ____________________________________________   MDM / ED COURSE  69 year old woman with smoking and COPD history presenting with shortness of breath and cough, most consistent with COPD exacerbation requiring medical observation admission.  Patient persistently tachycardic, otherwise normal vital signs on room air.  Patient was never hypoxic.  Exam demonstrates a tripoding patient in obvious distress who can only speak in short phrases due to her shortness of breath.  Pulmonary auscultation reveals poor air movement throughout and diffuse expiratory wheezes.  Patient was provided albuterol with Atrovent and Solu-Medrol with improving airflow on reevaluation.  Blood work noted with lactic acidosis, for which patient received 1 L of lactated Ringer's.  Due to increased sputum production and reports of outside sinus cultures with MRSA, patient was provided dose of doxycycline.  Due to severity of her COPD exacerbation will be the patient to hospitalist medicine for further work-up and management.  Clinical Course as of May 12 2339  Fri May 11, 2020  [facility] admitting hospitalist who agrees to see the patient for admission.   [DS]    Clinical Course User Index [DS] Delton PrairieSmith, Mikolaj Woolstenhulme, MD     ____________________________________________   FINAL CLINICAL IMPRESSION(S) / ED DIAGNOSES  Final diagnoses:  COPD exacerbation (HCC)  Lactic acidosis  Sinus tachycardia  Shortness of breath     ED Discharge Orders    None       Kensi Karr   Note:  This document was prepared using Dragon voice  recognition software and may include unintentional dictation errors.   Delton PrairieSmith, Munirah Doerner, MD 05/11/20 [phone]

## 2020-05-11 NOTE — ED Notes (Signed)
Pt sitting on side of bed at this time, daughter at bedside

## 2020-05-11 NOTE — H&P (Addendum)
Madison [name] is an 69 y.o. female.    From : Home  Chief Complaint: Shortness of breath,  HPI: The patient is a 69 yr old woman who struggles with COPD. She states that she seems to be in a cycle of exacerbation, steroid tapers and antibiotics, improvement followed by another exacerbation a couple of weeks later. She was recently evaluated by [facility] for choking and dysphagia. She was found to have sinusitis with culture positive for pseudomonas and MRSA. She was started on a 14 day course of ceftin and doxycycline. She also has a follow up with pulmonology in the future.   She has a past medical history significant for CKD, urinary retention, PVD, Seasonal allergies, tachycardia, lower extremity edema, and anemia.  In the ED she was found to have a lactic acid of 5.7, tachypnea, wheezes, and tachycardic. She is saturating 94% on 2 L.   She denies fevers, chills, or cough. She is wheezing and short of breath. She   [facility] were consulted to admit the patient for further evaluation and treatment.  COVID testing is pending. She tells me that she is vacccinated.  Past Medical History:  Diagnosis Date  . Anemia   . Arthritis   . Blood transfusion without reported diagnosis   . Chronic kidney disease 05/22/2016   urinary retention  has catheter     . Edema extremities   . Hyperlipidemia   . Peripheral vascular disease (HCC)   . Pneumonia 11/2013   Hx. pneumonia     . Seasonal allergies   . Tachycardia     Past Surgical History:  Procedure Laterality Date  . ANKLE SURGERY Right   . APPENDECTOMY    . BACK SURGERY  05/2016  . CARPAL TUNNEL RELEASE Left   . CHOLECYSTECTOMY    . TONSILLECTOMY    . TUBAL LIGATION    . VEIN LIGATION AND STRIPPING      Family History  Problem Relation Age of Onset  . Heart disease Mother   . Hypertension Mother   . Hyperlipidemia Mother   . Diabetes Brother    Social History:  reports that she has been smoking cigarettes. She  has a 0.13 pack-year smoking history. She has never used smokeless tobacco. She reports current alcohol use. She reports that she does not use drugs. (Not in a hospital admission)   Allergies:  Allergies  Allergen Reactions  . Eggs Or Egg-Derived Products Anaphylaxis    Egg albumin  . Morphine And Related     unknown  . Fentanyl   . Spiriva Handihaler [Tiotropium Bromide Monohydrate] Other (See Comments)    bronchiospasms  . Sulfa Antibiotics Hives  . Penicillins Rash    Has patient had a PCN reaction causing immediate rash, facial/tongue/throat swelling, SOB or lightheadedness with hypotension: YES Has patient had a PCN reaction causing severe rash involving mucus membranes or skin necrosis: NO Has patient had a PCN reaction that required hospitalization NO Has patient had a PCN reaction occurring within the last 10 years: NO If all of the above answers are "NO", then may proceed with Cephalosporin use.  . Voltaren [Diclofenac] Rash    Pertinent items noted in HPI and remainder of comprehensive ROS otherwise negative.   General appearance: alert, cooperative, moderate distress and moderately obese Head: Normocephalic, without obvious abnormality, atraumatic Eyes: conjunctivae/corneas clear. PERRL, EOM's intact. Fundi benign. Throat: lips, mucosa, and tongue normal; teeth and gums normal Neck: no adenopathy, no carotid bruit, no JVD, supple,  symmetrical, trachea midline and thyroid not enlarged, symmetric, no tenderness/mass/nodules Resp: Positive for tachypnea and dyspnea with conversation. Positive for scattered rales and wheezes. There are no rhonchi. No tactile fremitus. Chest wall: no tenderness Cardio: regular rate and rhythm, S1, S2 normal, no murmur, click, rub or gallop GI: soft, non-tender; bowel sounds normal; no masses,  no organomegaly Extremities: edema 4+ bilaterally and venous stasis dermatitis noted Pulses: 2+ and symmetric Skin: Skin color, texture, turgor  normal. No rashes or lesions or Noted erythema to lower extremities bilaterally due to venous stasis dermatitis. Lymph nodes: Cervical, supraclavicular, and axillary nodes normal. Neurologic: Alert and oriented X 3, normal strength and tone. Normal symmetric reflexes. Normal coordination and gait  Results for orders placed or performed during the hospital encounter of 05/11/20 (from the past 48 hour(s))  Basic metabolic panel     Status: Abnormal   Collection Time: 05/11/20  2:18 PM  Result Value Ref Range   Sodium 138 135 - 145 mmol/L   Potassium 3.8 3.5 - 5.1 mmol/L   Chloride 98 98 - 111 mmol/L   CO2 30 22 - 32 mmol/L   Glucose, Bld 174 (H) 70 - 99 mg/dL    Comment: Glucose reference range applies only to samples taken after fasting for at least 8 hours.   BUN 15 8 - 23 mg/dL   Creatinine, Ser 4.65 0.44 - 1.00 mg/dL   Calcium 9.1 8.9 - 03.5 mg/dL   GFR calc non Af Amer >60 >60 mL/min   GFR calc Af Amer >60 >60 mL/min   Anion gap 10 5 - 15    Comment: Performed at [facility], [address] [place], [place] [address]  CBC     Status: None   Collection Time: 05/11/20  2:18 PM  Result Value Ref Range   WBC 9.7 4.0 - 10.5 K/uL   RBC 4.34 3.87 - 5.11 MIL/uL   Hemoglobin 13.4 12.0 - 15.0 g/dL   HCT 12.7 36 - 46 %   MCV 95.6 80.0 - 100.0 fL   MCH 30.9 26.0 - 34.0 pg   MCHC 32.3 30.0 - 36.0 g/dL   RDW 51.7 00.1 - 74.9 %   Platelets 220 150 - 400 K/uL   nRBC 0.0 0.0 - 0.2 %    Comment: Performed at [facility], [address], [place], [place] [address]  Troponin I (High Sensitivity)     Status: None   Collection Time: 05/11/20  2:18 PM  Result Value Ref Range   Troponin I (High Sensitivity) 3 <18 ng/L    Comment: (NOTE) Elevated high sensitivity troponin I (hsTnI) values and significant  changes across serial measurements may suggest ACS but many other  chronic and acute conditions are known to elevate hsTnI results.  Refer to the "Links" section for  chest pain algorithms and additional  guidance. Performed at [facility], [address] [place], [place] [address]   Troponin I (High Sensitivity)     Status: None   Collection Time: 05/11/20  4:25 PM  Result Value Ref Range   Troponin I (High Sensitivity) 3 <18 ng/L    Comment: (NOTE) Elevated high sensitivity troponin I (hsTnI) values and significant  changes across serial measurements may suggest ACS but many other  chronic and acute conditions are known to elevate hsTnI results.  Refer to the "Links" section for chest pain algorithms and additional  guidance. Performed at [facility], [address], [place], [place] [address]  Lactic acid, plasma     Status: Abnormal   Collection Time: 05/11/20 10:38 PM  Result Value Ref Range   Lactic Acid, Venous 5.7 (HH) 0.5 - 1.9 mmol/L    Comment: CRITICAL RESULT CALLED TO, READ BACK BY AND VERIFIED WITH CHRISTOPHER BUCKNER ON 05/11/20 AT 2325 BY JAG Performed at [facility], [address] [place], [place] [address]    @[email or web address] @  Blood pressure (!) 170/90, pulse (!) 119, temperature 98.4 F (36.9 C), temperature source Oral, resp. rate (!) 25, height 4\' 11"  (1.499 m), weight 92.5 kg, SpO2 94 %.   Assessment/Plan Acute hypoxic respiratory failure: The patient is currently saturating 94% on 6L. Wean O2 as possible.  COPD exacerbation: Likely due to pseudomonas sinusitis.Continue antibiotics. Treat with IV steroids and nebulizer treatments.  Pseudomonas/Staph Sinusitis as per sinus cultures: Continue antibiotics with flagyl and doxycycline. This is likely the cause for her COPD exacerbation.  Lactic acidosis: Likely due to the increased work of breathing. She is getting an IV bolus. Monitor lactic acid. No fever, WBC increase, or hypotension. No evidence for sepsis.   Peripheral edema: The patient states that it is worse right now, because she has had to sit up for the last couple of days due to  dyspnea. It appears that the patient has chronic venous stasis dermatitis as well. Should consider diuresis once lactic acidosis is resolved.  I have seen and examined this patient myself. I have spent 74 minutes in her evaluation and care.  DVT Prophylaxis: Lovenox CODE STATUS: Full Code Family Communication: Patient's daughter is at bedside Disposition: Patient will be admitted to a med/surg bed on observation status.   Madison [name] 05/11/2020, 11:36 PM

## 2020-05-12 ENCOUNTER — Encounter: Payer: Self-pay | Admitting: Internal Medicine

## 2020-05-12 DIAGNOSIS — Z91048 Other nonmedicinal substance allergy status: Secondary | ICD-10-CM | POA: Diagnosis not present

## 2020-05-12 DIAGNOSIS — Z79899 Other long term (current) drug therapy: Secondary | ICD-10-CM | POA: Diagnosis not present

## 2020-05-12 DIAGNOSIS — M1990 Unspecified osteoarthritis, unspecified site: Secondary | ICD-10-CM | POA: Diagnosis present

## 2020-05-12 DIAGNOSIS — Z885 Allergy status to narcotic agent status: Secondary | ICD-10-CM | POA: Diagnosis not present

## 2020-05-12 DIAGNOSIS — R0602 Shortness of breath: Secondary | ICD-10-CM | POA: Diagnosis present

## 2020-05-12 DIAGNOSIS — T380X5A Adverse effect of glucocorticoids and synthetic analogues, initial encounter: Secondary | ICD-10-CM | POA: Diagnosis present

## 2020-05-12 DIAGNOSIS — R739 Hyperglycemia, unspecified: Secondary | ICD-10-CM | POA: Diagnosis present

## 2020-05-12 DIAGNOSIS — Z880 Allergy status to penicillin: Secondary | ICD-10-CM | POA: Diagnosis not present

## 2020-05-12 DIAGNOSIS — J984 Other disorders of lung: Secondary | ICD-10-CM | POA: Diagnosis present

## 2020-05-12 DIAGNOSIS — R778 Other specified abnormalities of plasma proteins: Secondary | ICD-10-CM

## 2020-05-12 DIAGNOSIS — I872 Venous insufficiency (chronic) (peripheral): Secondary | ICD-10-CM | POA: Diagnosis present

## 2020-05-12 DIAGNOSIS — Z9851 Tubal ligation status: Secondary | ICD-10-CM | POA: Diagnosis not present

## 2020-05-12 DIAGNOSIS — B965 Pseudomonas (aeruginosa) (mallei) (pseudomallei) as the cause of diseases classified elsewhere: Secondary | ICD-10-CM | POA: Diagnosis present

## 2020-05-12 DIAGNOSIS — R600 Localized edema: Secondary | ICD-10-CM | POA: Diagnosis present

## 2020-05-12 DIAGNOSIS — J329 Chronic sinusitis, unspecified: Secondary | ICD-10-CM | POA: Diagnosis present

## 2020-05-12 DIAGNOSIS — J441 Chronic obstructive pulmonary disease with (acute) exacerbation: Secondary | ICD-10-CM | POA: Diagnosis present

## 2020-05-12 DIAGNOSIS — J9601 Acute respiratory failure with hypoxia: Secondary | ICD-10-CM | POA: Diagnosis present

## 2020-05-12 DIAGNOSIS — Z882 Allergy status to sulfonamides status: Secondary | ICD-10-CM | POA: Diagnosis not present

## 2020-05-12 DIAGNOSIS — R071 Chest pain on breathing: Secondary | ICD-10-CM

## 2020-05-12 DIAGNOSIS — E872 Acidosis: Secondary | ICD-10-CM | POA: Diagnosis present

## 2020-05-12 DIAGNOSIS — Z888 Allergy status to other drugs, medicaments and biological substances status: Secondary | ICD-10-CM | POA: Diagnosis not present

## 2020-05-12 DIAGNOSIS — E785 Hyperlipidemia, unspecified: Secondary | ICD-10-CM | POA: Diagnosis present

## 2020-05-12 DIAGNOSIS — F17210 Nicotine dependence, cigarettes, uncomplicated: Secondary | ICD-10-CM | POA: Diagnosis present

## 2020-05-12 DIAGNOSIS — Z20822 Contact with and (suspected) exposure to covid-19: Secondary | ICD-10-CM | POA: Diagnosis present

## 2020-05-12 DIAGNOSIS — Z9049 Acquired absence of other specified parts of digestive tract: Secondary | ICD-10-CM | POA: Diagnosis not present

## 2020-05-12 DIAGNOSIS — I10 Essential (primary) hypertension: Secondary | ICD-10-CM | POA: Diagnosis present

## 2020-05-12 DIAGNOSIS — I739 Peripheral vascular disease, unspecified: Secondary | ICD-10-CM | POA: Diagnosis present

## 2020-05-12 LAB — CBC
HCT: 39.3 % (ref 36.0–46.0)
Hemoglobin: 12.8 g/dL (ref 12.0–15.0)
MCH: 31 pg (ref 26.0–34.0)
MCHC: 32.6 g/dL (ref 30.0–36.0)
MCV: 95.2 fL (ref 80.0–100.0)
Platelets: 222 10*3/uL (ref 150–400)
RBC: 4.13 MIL/uL (ref 3.87–5.11)
RDW: 13.4 % (ref 11.5–15.5)
WBC: 13 10*3/uL — ABNORMAL HIGH (ref 4.0–10.5)
nRBC: 0 % (ref 0.0–0.2)

## 2020-05-12 LAB — COMPREHENSIVE METABOLIC PANEL
ALT: 22 U/L (ref 0–44)
AST: 27 U/L (ref 15–41)
Albumin: 4 g/dL (ref 3.5–5.0)
Alkaline Phosphatase: 54 U/L (ref 38–126)
Anion gap: 11 (ref 5–15)
BUN: 17 mg/dL (ref 8–23)
CO2: 24 mmol/L (ref 22–32)
Calcium: 9.2 mg/dL (ref 8.9–10.3)
Chloride: 102 mmol/L (ref 98–111)
Creatinine, Ser: 1.09 mg/dL — ABNORMAL HIGH (ref 0.44–1.00)
GFR calc Af Amer: 59 mL/min — ABNORMAL LOW (ref >60)
GFR calc non Af Amer: 51 mL/min — ABNORMAL LOW (ref >60)
Glucose, Bld: 233 mg/dL — ABNORMAL HIGH (ref 70–99)
Potassium: 4 mmol/L (ref 3.5–5.1)
Sodium: 137 mmol/L (ref 135–145)
Total Bilirubin: 0.7 mg/dL (ref 0.3–1.2)
Total Protein: 7.3 g/dL (ref 6.5–8.1)

## 2020-05-12 LAB — HEMOGLOBIN A1C
Hgb A1c MFr Bld: 6.1 % — ABNORMAL HIGH (ref 4.8–5.6)
Mean Plasma Glucose: 128.37 mg/dL

## 2020-05-12 LAB — TROPONIN I (HIGH SENSITIVITY)
Troponin I (High Sensitivity): 91 ng/L — ABNORMAL HIGH (ref <18)
Troponin I (High Sensitivity): 140 ng/L (ref <18)

## 2020-05-12 LAB — LACTIC ACID, PLASMA
Lactic Acid, Venous: 4.9 mmol/L (ref 0.5–1.9)
Lactic Acid, Venous: 3.2 mmol/L (ref 0.5–1.9)

## 2020-05-12 LAB — SARS CORONAVIRUS 2 BY RT PCR (HOSPITAL ORDER, PERFORMED IN ~~LOC~~ HOSPITAL LAB): SARS Coronavirus 2: NEGATIVE

## 2020-05-12 LAB — HIV ANTIBODY (ROUTINE TESTING W REFLEX): HIV Screen 4th Generation wRfx: NONREACTIVE

## 2020-05-12 MED ORDER — FAMOTIDINE 20 MG PO TABS
20.0000 mg | ORAL_TABLET | Freq: Every day | ORAL | Status: DC
  Administered 2020-05-13 – 2020-05-14 (×2): 20 mg via ORAL
  Filled 2020-05-12 (×2): qty 1

## 2020-05-12 MED ORDER — ALBUTEROL SULFATE (2.5 MG/3ML) 0.083% IN NEBU
2.5000 mg | INHALATION_SOLUTION | Freq: Four times a day (QID) | RESPIRATORY_TRACT | Status: DC
  Administered 2020-05-12 – 2020-05-14 (×8): 2.5 mg via RESPIRATORY_TRACT
  Filled 2020-05-12 (×9): qty 3

## 2020-05-12 MED ORDER — LACTATED RINGERS IV SOLN: INTRAVENOUS | Status: DC

## 2020-05-12 MED ORDER — ALBUTEROL SULFATE (2.5 MG/3ML) 0.083% IN NEBU
2.5000 mg | INHALATION_SOLUTION | RESPIRATORY_TRACT | Status: DC
  Administered 2020-05-12 (×2): 2.5 mg via RESPIRATORY_TRACT
  Filled 2020-05-12 (×2): qty 3

## 2020-05-12 MED ORDER — BENZONATATE 100 MG PO CAPS
100.0000 mg | ORAL_CAPSULE | Freq: Three times a day (TID) | ORAL | Status: DC | PRN
  Administered 2020-05-12 – 2020-05-13 (×4): 100 mg via ORAL
  Filled 2020-05-12 (×4): qty 1

## 2020-05-12 MED ORDER — INSULIN ASPART 100 UNIT/ML ~~LOC~~ SOLN
0.0000 [IU] | Freq: Three times a day (TID) | SUBCUTANEOUS | Status: DC
  Administered 2020-05-14: 15 [IU] via SUBCUTANEOUS
  Filled 2020-05-12 (×2): qty 1

## 2020-05-12 NOTE — ED Notes (Signed)
Complained of headache following breathing tx

## 2020-05-12 NOTE — Progress Notes (Signed)
PT Cancellation Note  Patient Details Name: Madison [name] MRN: [id number] DOB: 1951/07/30   Cancelled Treatment:    Reason Eval/Treat Not Completed: Medical issues which prohibited therapy (Consult received and chart reviewed. Upon arrival to room, patient SOB at rest, reporting substernal chest pain with radiation into jaw and L shoulder.  RN informed/aware and at bedside to assess.  Will re-attempt PT evaluation at later time/date as medically appropriate.)   Ksenia Kunz H. Manson Passey, PT, DPT, NCS 05/12/20, 10:44 AM [phone]

## 2020-05-12 NOTE — Progress Notes (Addendum)
PROGRESS NOTE    Madison [name]  TLX:[id number] DOB: 02/03/1951 DOA: 05/11/2020 PCP: Dione Housekeeper, MD    Brief Narrative:  The patient is a 69 yr old woman who struggles with COPD. She states that she seems to be in a cycle of exacerbation, steroid tapers and antibiotics, improvement followed by another exacerbation a couple of weeks later. She was recently evaluated by [facility] for choking and dysphagia. She was found to have sinusitis with culture positive for pseudomonas and MRSA. She was started on a 14 day course of ceftin and doxycycline. She also has a follow up with pulmonology in the future.  She has a past medical history significant for CKD, urinary retention, PVD, Seasonal allergies, tachycardia, lower extremity edema, and anemia.  In the ED she was found to have a lactic acid of 5.7, tachypnea, wheezes, and tachycardic. She is saturating 94% on 2 L.   She denies fevers, chills, or cough. She is wheezing and short of breath. She   [facility] were consulted to admit the patient for further evaluation and treatment.  Pt reports being vacccinated. covid negative    Consultants:     Procedures:   Antimicrobials:   Doxycycline 9/3   Omnicef 9/3   Subjective: Patient this earlier was wheezing and was short of breath was given duo nebs with some improvement however she began having midsternal chest pain radiating to her left arm and jaw.  Currently chest pain has resolved.  Objective: Vitals:   05/12/20 0030 05/12/20 0128 05/12/20 0831 05/12/20 0833  BP: (!) 180/86 (!) 181/74 (!) 169/79   Pulse: (!) 117 (!) 103 99 98  Resp: (!) 22 20 18    Temp:  98.2 F (36.8 C) 97.9 F (36.6 C)   TempSrc:  Oral Oral   SpO2: 95% 97% (!) 87% 99%  Weight:      Height:        Intake/Output Summary (Last 24 hours) at 05/12/2020 1505 Last data filed at 05/12/2020 1023 Gross per 24 hour  Intake 600.56 ml  Output --  Net 600.56 ml   Filed Weights    05/11/20 1415  Weight: 92.5 kg    Examination:  General exam: pt mildly becomes sob with convresation. Respiratory system: Clear to auscultation no wheeze rales rhonchi Cardiovascular system: RRR S1-S2 no murmurs  Gastrointestinal system: Soft nontender nondistended positive bowel sounds Central nervous system: Alert and oriented.  Grossly intact alert and oriented. No focal neurological deficits. Extremities: Mild bilateral edema with chronic venous stasis Skin: Warm dry Psychiatry: Judgement and insight appear normal. Mood & affect appropriate.     Data Reviewed: I have personally reviewed following labs and imaging studies  CBC: Recent Labs  Lab 05/11/20 1418 05/12/20 0708  WBC 9.7 13.0*  HGB 13.4 12.8  HCT 41.5 39.3  MCV 95.6 95.2  PLT 220 222   Basic Metabolic Panel: Recent Labs  Lab 05/11/20 1418 05/12/20 0708  NA 138 137  K 3.8 4.0  CL 98 102  CO2 30 24  GLUCOSE 174* 233*  BUN 15 17  CREATININE 0.84 1.09*  CALCIUM 9.1 9.2   GFR: Estimated Creatinine Clearance: 47 mL/min (A) (by C-G formula based on SCr of 1.09 mg/dL (H)). Liver Function Tests: Recent Labs  Lab 05/12/20 0708  AST 27  ALT 22  ALKPHOS 54  BILITOT 0.7  PROT 7.3  ALBUMIN 4.0   No results for input(s): LIPASE, AMYLASE in the last 168 hours. No results for input(s):  AMMONIA in the last 168 hours. Coagulation Profile: No results for input(s): INR, PROTIME in the last 168 hours. Cardiac Enzymes: No results for input(s): CKTOTAL, CKMB, CKMBINDEX, TROPONINI in the last 168 hours. BNP (last 3 results) No results for input(s): PROBNP in the last 8760 hours. HbA1C: No results for input(s): HGBA1C in the last 72 hours. CBG: No results for input(s): GLUCAP in the last 168 hours. Lipid Profile: No results for input(s): CHOL, HDL, LDLCALC, TRIG, CHOLHDL, LDLDIRECT in the last 72 hours. Thyroid Function Tests: No results for input(s): TSH, T4TOTAL, FREET4, T3FREE, THYROIDAB in the last 72  hours. Anemia Panel: No results for input(s): VITAMINB12, FOLATE, FERRITIN, TIBC, IRON, RETICCTPCT in the last 72 hours. Sepsis Labs: Recent Labs  Lab 05/11/20 2238 05/12/20 1147  LATICACIDVEN 5.7* 4.9*    Recent Results (from the past 240 hour(s))  SARS Coronavirus 2 by RT PCR (hospital order, performed in [facility] hospital lab) Nasopharyngeal Nasopharyngeal Swab     Status: None   Collection Time: 05/11/20 10:38 PM   Specimen: Nasopharyngeal Swab  Result Value Ref Range Status   SARS Coronavirus 2 NEGATIVE NEGATIVE Final    Comment: (NOTE) SARS-CoV-2 target nucleic acids are NOT DETECTED.  The SARS-CoV-2 RNA is generally detectable in upper and lower respiratory specimens during the acute phase of infection. The lowest concentration of SARS-CoV-2 viral copies this assay can detect is 250 copies / mL. A negative result does not preclude SARS-CoV-2 infection and should not be used as the sole basis for treatment or other patient management decisions.  A negative result may occur with improper specimen collection / handling, submission of specimen other than nasopharyngeal swab, presence of viral mutation(s) within the areas targeted by this assay, and inadequate number of viral copies (<250 copies / mL). A negative result must be combined with clinical observations, patient history, and epidemiological information.  Fact Sheet for Patients:   BoilerBrush.com.cy  Fact Sheet for Healthcare Providers: https://pope.com/  This test is not yet approved or  cleared by the [place] FDA and has been authorized for detection and/or diagnosis of SARS-CoV-2 by FDA under an Emergency Use Authorization (EUA).  This EUA will remain in effect (meaning this test can be used) for the duration of the COVID-19 declaration under Section 564(b)(1) of the Act, 21 U.S.C. section 360bbb-3(b)(1), unless the authorization is terminated  or revoked sooner.  Performed at [facility], [address] [place], [place] [address]          Radiology Studies: DG Chest 2 View  Result Date: 05/11/2020 CLINICAL DATA:  Short of breath EXAM: CHEST - 2 VIEW COMPARISON:  04/22/2020 FINDINGS: Pulmonary hyperinflation. Lungs are clear without infiltrate effusion or edema. Heart size and vascularity normal. IMPRESSION: Pulmonary hyperinflation.  No acute abnormality. Electronically Signed   By: Marlan Palau M.D.   On: 05/11/2020 14:48        Scheduled Meds: . albuterol  2.5 mg Nebulization Q4H  . cefdinir  300 mg Oral Q12H  . enoxaparin (LOVENOX) injection  40 mg Subcutaneous Q24H  . fluticasone  2 spray Each Nare Daily  . guaiFENesin  600 mg Oral BID  . methylPREDNISolone (SOLU-MEDROL) injection  60 mg Intravenous Q12H  . metoprolol tartrate  50 mg Oral BID   Continuous Infusions: . lactated ringers 50 mL/hr at 05/12/20 1217    Assessment & Plan:   Active Problems:   COPD exacerbation (HCC)   Essential hypertension   Sinusitis   1.  Acute  hypoxic respiratory failure-present on admission.  She was on 6 L satting 94% now weaned down to room air now.   2.  COPD exacerbation-likely Pseudomonas sinusitis. We will continue IV antibiotics and steroids. Duo nebs Elevated lactic acid  3.CP- concerning. EKG ordered, will review. Tp now elevated. Also her respiratory status might be contributing to her. Will check echo Cardiology consulted.  4. Lactic acidosis-due to respiratory status.  Trending downward. No evidence of sepsis.  No fever, or hypotension.  White count elevated due to steroid use.  5. Venous statsis dermatitis/peripheral edema Keep legs elevated. May need compression stockings to use as oupt   DVT prophylaxis: Lovenox Code Status: Full Family Communication: None at bedside  Status is: Inpatient  Patient is inpatient as she is requiring IV medications for her medical care.    Dispo: The patient is from: Home              Anticipated d/c is to: Home              Anticipated d/c date is: 3 days              Patient currently is not medically stable to d/c.Needs iv medication. Now with elevated troponins.  Work-up pending.            LOS: 0 days   Time spent:45 min with >50% on coc    Lynn Ito, MD [facility] Pager [phone]  If 7PM-7AM, please contact night-coverage [email or web address] Password [email or web address] 05/12/2020, 3:05 PM

## 2020-05-12 NOTE — Progress Notes (Signed)
OT Cancellation Note  Patient Details Name: [name] MRN: [id number] DOB: 06-Feb-1951   Cancelled Treatment:    Reason Eval/Treat Not Completed: Medical issues which prohibited therapy. Per conversation c PT, RN requesting to hold therapy this AM while assessing chest pain/SOB. Will follow and initiate services as appropriate.   Kathie Dike, M.S. OTR/L  05/12/20, 10:53 AM  ascom [phone]

## 2020-05-12 NOTE — Consult Note (Signed)
Cardiology Consultation:   Patient ID: Madison [name] MRN: [id number]; DOB: 10/22/1950  Admit date: 05/11/2020 Date of Consult: 05/12/2020  Primary Care Provider: Dione Housekeeper, MD [facility] Cardiologist: new to [facility]- Dr. Azucena Cecil rounding [facility] Electrophysiologist:  None     Patient Profile:   Madison [name] is a 69 y.o. female with a hx of orchitis, former smoker who is being seen today for the evaluation of evaded troponins at the request of Dr. Marylu Lund.  History of Present Illness:   Madison [name] 69 year old female with history of bronchitis, former smoker x35+ years who presents due to worsening shortness of breath, wheezing and cough.  Patient states not feeling well beginning 6 months ago.  She was at home and trying to clean some leaves around her house.  She felt like she got a mold infection.  Had symptoms of coughing and shortness of breath.  Was seen at an urgent care facility where doxycycline and steroids with taper was ordered.  Patient states symptoms improved after steroids and antibiotics.  She again subsequently 2 months later became very short of breath, associated with wheezing and productive cough with greenish phlegm production.  Was seen in the ED at the time where steroids, antibiotics were given with improvement in symptoms.  Patient denies any chest pain when she is not having respiratory symptoms of wheezing, cough, shortness of breath.  Whenever she has bouts of productive cough, she occasionally coughs up mucus plaque, and feels like she cannot take in air fully, associated with what she thinks is a spasm below her sternum.  Currently seen on outpatient basis by ENT where she had endoscopy and cultures growing Pseudomonas.  She denies any history of heart disease.  Her mother had an MI in her 23s.  During this hospitalization for shortness of breath, cough, wheezing, chest x-ray showed hyper inflated lungs, otherwise clear.  EKG showed  normal sinus rhythm, no changes to suggest ischemia.  Troponin was 3, 3, 91.  Lactate was elevated at 5.7.  Patient started on antibiotics, inhalers, steroids.   Past Medical History:  Diagnosis Date  . Anemia   . Arthritis   . Blood transfusion without reported diagnosis   . Chronic kidney disease 05/22/2016   urinary retention  has catheter     . Edema extremities   . Hyperlipidemia   . Peripheral vascular disease (HCC)   . Pneumonia 11/2013   Hx. pneumonia     . Seasonal allergies   . Tachycardia     Past Surgical History:  Procedure Laterality Date  . ANKLE SURGERY Right   . APPENDECTOMY    . BACK SURGERY  05/2016  . CARPAL TUNNEL RELEASE Left   . CHOLECYSTECTOMY    . TONSILLECTOMY    . TUBAL LIGATION    . VEIN LIGATION AND STRIPPING       Home Medications:  Prior to Admission medications   Medication Sig Start Date End Date Taking? Authorizing Provider  albuterol (PROVENTIL) (2.5 MG/3ML) 0.083% nebulizer solution SMARTSIG:3 Milliliter(s) Via Nebulizer Every 6 Hours PRN 04/17/20  Yes [provider]  albuterol (VENTOLIN HFA) 108 (90 Base) MCG/ACT inhaler Inhale 1-2 puffs into the lungs every 4 (four) hours as needed for wheezing or shortness of breath.   Yes [provider]  benzonatate (TESSALON) 100 MG capsule Take by mouth 3 (three) times daily as needed for cough.   Yes [provider]  cefUROXime (CEFTIN) 500 MG tablet Take 500 mg by mouth  2 (two) times daily. 05/11/20 05/25/20 Yes [provider]  Cholecalciferol (VITAMIN D3 PO) Take by mouth daily.   Yes [provider]  cyanocobalamin (V-R VITAMIN B-12) 500 MCG tablet Take 500 mcg by mouth daily.    Yes [provider]  dextromethorphan-guaiFENesin (MUCINEX DM) 30-600 MG 12hr tablet Take 2 tablets by mouth 2 (two) times daily.    Yes [provider]  fluticasone (FLONASE) 50 MCG/ACT nasal spray Place 2 sprays into both nostrils daily.    Yes [provider]  fluticasone-salmeterol (ADVAIR HFA) 230-21 MCG/ACT inhaler Inhale 2 puffs into the lungs every 12 (twelve) hours. 04/27/20 04/27/21 Yes [provider]  furosemide (LASIX) 20 MG tablet Take 20 mg by mouth daily.   Yes [provider]  metoprolol (LOPRESSOR) 50 MG tablet Take 50 mg by mouth 2 (two) times daily.  02/26/16  Yes [provider]  naproxen (NAPROSYN) 500 MG tablet Take 500 mg by mouth 2 (two) times daily as needed.   Yes [provider]  potassium chloride SA (KLOR-CON) 20 MEQ tablet Take 20 mEq by mouth daily.   Yes [provider]  predniSONE (STERAPRED UNI-PAK 21 TAB) 10 MG (21) TBPK tablet Start 60 mg po daily, taper 10 mg daily until done Patient not taking: Reported on 05/11/2020 04/23/20   Delfino LovettShah, Vipul, MD    Inpatient Medications: Scheduled Meds: . albuterol  2.5 mg Nebulization Q6H  . cefdinir  300 mg Oral Q12H  . enoxaparin (LOVENOX) injection  40 mg Subcutaneous Q24H  . fluticasone  2 spray Each Nare Daily  . guaiFENesin  600 mg Oral BID  . methylPREDNISolone (SOLU-MEDROL) injection  60 mg Intravenous Q12H  . metoprolol tartrate  50 mg Oral BID   Continuous Infusions: . lactated ringers 50 mL/hr at 05/12/20 1217   PRN Meds: acetaminophen **OR** acetaminophen, benzonatate, bisacodyl, polyethylene glycol, sorbitol  Allergies:    Allergies  Allergen Reactions  . Eggs Or Egg-Derived Products Anaphylaxis    Egg albumin  . Morphine And Related     unknown  . Fentanyl   . Spiriva Handihaler [Tiotropium Bromide Monohydrate] Other (See Comments)    bronchiospasms  . Sulfa Antibiotics Hives  . Penicillins Rash    Has patient had a PCN reaction causing immediate rash, facial/tongue/throat swelling, SOB or lightheadedness with hypotension: YES Has patient had a PCN reaction causing severe rash involving mucus membranes or skin necrosis: NO Has patient had a PCN reaction that required hospitalization NO Has  patient had a PCN reaction occurring within the last 10 years: NO If all of the above answers are "NO", then may proceed with Cephalosporin use.  . Voltaren [Diclofenac] Rash    Social History:   Social History   Socioeconomic History  . Marital status: Married    Spouse name: Not on file  . Number of children: Not on file  . Years of education: Not on file  . Highest education level: Not on file  Occupational History  . Not on file  Tobacco Use  . Smoking status: Former Smoker    Packs/day: 0.50    Years: 0.50    Pack years: 0.25    Types: Cigarettes  . Smokeless tobacco: Never Used  . Tobacco comment: 4- 5 cigs per day.   Substance and Sexual Activity  . Alcohol use: Yes    Alcohol/week: 0.0 standard drinks    Comment: ocasionally  glass wine  . Drug use: No  . Sexual activity: Not  on file  Other Topics Concern  . Not on file  Social History Narrative  . Not on file   Social Determinants of Health   Financial Resource Strain:   . Difficulty of Paying Living Expenses: Not on file  Food Insecurity:   . Worried About Programme researcher, broadcasting/film/video in the Last Year: Not on file  . Ran Out of Food in the Last Year: Not on file  Transportation Needs:   . Lack of Transportation (Medical): Not on file  . Lack of Transportation (Non-Medical): Not on file  Physical Activity:   . Days of Exercise per Week: Not on file  . Minutes of Exercise per Session: Not on file  Stress:   . Feeling of Stress : Not on file  Social Connections:   . Frequency of Communication with Friends and Family: Not on file  . Frequency of Social Gatherings with Friends and Family: Not on file  . Attends Religious Services: Not on file  . Active Member of Clubs or Organizations: Not on file  . Attends Banker Meetings: Not on file  . Marital Status: Not on file  Intimate Partner Violence:   . Fear of Current or Ex-Partner: Not on file  . Emotionally Abused: Not on file  . Physically Abused:  Not on file  . Sexually Abused: Not on file    Family History:    Family History  Problem Relation Age of Onset  . Heart disease Mother   . Hypertension Mother   . Hyperlipidemia Mother   . Diabetes Brother      ROS:  Please see the history of present illness.   All other ROS reviewed and negative.     Physical Exam/Data:   Vitals:   05/12/20 0030 05/12/20 0128 05/12/20 0831 05/12/20 0833  BP: (!) 180/86 (!) 181/74 (!) 169/79   Pulse: (!) 117 (!) 103 99 98  Resp: (!) 22 20 18    Temp:  98.2 F (36.8 C) 97.9 F (36.6 C)   TempSrc:  Oral Oral   SpO2: 95% 97% (!) 87% 99%  Weight:      Height:        Intake/Output Summary (Last 24 hours) at 05/12/2020 1530 Last data filed at 05/12/2020 1023 Gross per 24 hour  Intake 600.56 ml  Output --  Net 600.56 ml   Last 3 Weights 05/11/2020 04/22/2020 01/31/2020  Weight (lbs) 204 lb 204 lb 201 lb 15.1 oz  Weight (kg) 92.534 kg 92.534 kg 91.6 kg     Body mass index is 41.2 kg/m.  General:  Well nourished, well developed, mild respiratory distress HEENT: normal Lymph: no adenopathy Neck: no JVD Endocrine:  No thryomegaly Vascular: No carotid bruits; FA pulses 2+ bilaterally without bruits  Cardiac:  normal S1, S2; RRR; no murmur  Lungs: Expiratory wheezing at bases.  Decreased breath sounds. Abd: soft, nontender, no hepatomegaly  Ext: no edema Musculoskeletal:  No deformities, BUE and BLE strength normal and equal Skin: warm and dry  Neuro:  CNs 2-12 intact, no focal abnormalities noted Psych:  Normal affect   EKG:  The EKG was personally reviewed and demonstrates: Sinus rhythm Telemetry:  Telemetry was personally reviewed and demonstrates: Denies tachycardia  Relevant CV Studies: None  Laboratory Data:  High Sensitivity Troponin:   Recent Labs  Lab 04/22/20 1431 04/22/20 1755 05/11/20 1418 05/11/20 1625 05/12/20 1147  TROPONINIHS <2 2 3 3  91*     Chemistry Recent Labs  Lab 05/11/20 1418  05/12/20 0708  NA  138 137  K 3.8 4.0  CL 98 102  CO2 30 24  GLUCOSE 174* 233*  BUN 15 17  CREATININE 0.84 1.09*  CALCIUM 9.1 9.2  GFRNONAA >60 51*  GFRAA >60 59*  ANIONGAP 10 11    Recent Labs  Lab 05/12/20 0708  PROT 7.3  ALBUMIN 4.0  AST 27  ALT 22  ALKPHOS 54  BILITOT 0.7   Hematology Recent Labs  Lab 05/11/20 1418 05/12/20 0708  WBC 9.7 13.0*  RBC 4.34 4.13  HGB 13.4 12.8  HCT 41.5 39.3  MCV 95.6 95.2  MCH 30.9 31.0  MCHC 32.3 32.6  RDW 13.5 13.4  PLT 220 222   BNPNo results for input(s): BNP, PROBNP in the last 168 hours.  DDimer No results for input(s): DDIMER in the last 168 hours.   Radiology/Studies:  DG Chest 2 View  Result Date: 05/11/2020 CLINICAL DATA:  Short of breath EXAM: CHEST - 2 VIEW COMPARISON:  04/22/2020 FINDINGS: Pulmonary hyperinflation. Lungs are clear without infiltrate effusion or edema. Heart size and vascularity normal. IMPRESSION: Pulmonary hyperinflation.  No acute abnormality. Electronically Signed   By: Marlan Palau M.D.   On: 05/11/2020 14:48           Assessment and Plan:   1. Elevated troponins -Minimally elevated troponins -EKG normal -Chest discomfort symptoms are associated with respiratory syndrome of coughing spells and wheezing. -Troponin elevation likely supply demand mismatch. -Symptoms not consistent with cardiac etiology. -Continue to trend troponins until peaked -We will get echocardiogram due to dyspnea.  If echo is normal, we will not plan for additional cardiac testing or intervention.  2.  Dyspnea, hyperinflated lung, former smoker, elevated lactate -Patient likely has COPD in light of long history of smoking -Nebulizer, steroid, antibiotics as per primary team    Signed, Debbe Odea, MD  05/12/2020 3:30 PM

## 2020-05-12 NOTE — ED Notes (Signed)
Report called by April, RN- Charge Nurse

## 2020-05-13 ENCOUNTER — Inpatient Hospital Stay (HOSPITAL_COMMUNITY)
Admit: 2020-05-13 | Discharge: 2020-05-13 | Disposition: A | Discharge status: Home / Self Care | Payer: Medicare Other | Attending: Internal Medicine | Admitting: Internal Medicine

## 2020-05-13 DIAGNOSIS — R0602 Shortness of breath: Secondary | ICD-10-CM

## 2020-05-13 DIAGNOSIS — R778 Other specified abnormalities of plasma proteins: Secondary | ICD-10-CM

## 2020-05-13 DIAGNOSIS — R079 Chest pain, unspecified: Secondary | ICD-10-CM

## 2020-05-13 LAB — ECHOCARDIOGRAM COMPLETE
AR max vel: 2.34 cm2
AV Area VTI: 2.5 cm2
AV Area mean vel: 2.47 cm2
AV Mean grad: 5 mmHg
AV Peak grad: 8.9 mmHg
Ao pk vel: 1.49 m/s
Area-P 1/2: 3.42 cm2
Height: 59 in
S' Lateral: 2.31 cm
Weight: 3264 oz

## 2020-05-13 LAB — BASIC METABOLIC PANEL
Anion gap: 11 (ref 5–15)
BUN: 20 mg/dL (ref 8–23)
CO2: 26 mmol/L (ref 22–32)
Calcium: 9.6 mg/dL (ref 8.9–10.3)
Chloride: 101 mmol/L (ref 98–111)
Creatinine, Ser: 0.79 mg/dL (ref 0.44–1.00)
GFR calc Af Amer: >60 mL/min (ref >60)
GFR calc non Af Amer: >60 mL/min (ref >60)
Glucose, Bld: 271 mg/dL — ABNORMAL HIGH (ref 70–99)
Potassium: 4.1 mmol/L (ref 3.5–5.1)
Sodium: 138 mmol/L (ref 135–145)

## 2020-05-13 LAB — CBC
HCT: 40.5 % (ref 36.0–46.0)
Hemoglobin: 13.3 g/dL (ref 12.0–15.0)
MCH: 30.7 pg (ref 26.0–34.0)
MCHC: 32.8 g/dL (ref 30.0–36.0)
MCV: 93.5 fL (ref 80.0–100.0)
Platelets: 245 10*3/uL (ref 150–400)
RBC: 4.33 MIL/uL (ref 3.87–5.11)
RDW: 13.6 % (ref 11.5–15.5)
WBC: 16.5 10*3/uL — ABNORMAL HIGH (ref 4.0–10.5)
nRBC: 0 % (ref 0.0–0.2)

## 2020-05-13 LAB — GLUCOSE, CAPILLARY
Glucose-Capillary: 232 mg/dL — ABNORMAL HIGH (ref 70–99)
Glucose-Capillary: 255 mg/dL — ABNORMAL HIGH (ref 70–99)
Glucose-Capillary: 283 mg/dL — ABNORMAL HIGH (ref 70–99)
Glucose-Capillary: 287 mg/dL — ABNORMAL HIGH (ref 70–99)

## 2020-05-13 LAB — LACTIC ACID, PLASMA
Lactic Acid, Venous: 3.3 mmol/L (ref 0.5–1.9)
Lactic Acid, Venous: 2.6 mmol/L (ref 0.5–1.9)

## 2020-05-13 LAB — TROPONIN I (HIGH SENSITIVITY)
Troponin I (High Sensitivity): 80 ng/L — ABNORMAL HIGH (ref <18)
Troponin I (High Sensitivity): 86 ng/L — ABNORMAL HIGH (ref <18)

## 2020-05-13 LAB — BRAIN NATRIURETIC PEPTIDE: B Natriuretic Peptide: 248.4 pg/mL — ABNORMAL HIGH (ref 0.0–100.0)

## 2020-05-13 MED ORDER — FUROSEMIDE 20 MG PO TABS
20.0000 mg | ORAL_TABLET | Freq: Every day | ORAL | Status: DC
  Administered 2020-05-13 – 2020-05-14 (×2): 20 mg via ORAL
  Filled 2020-05-13 (×2): qty 1

## 2020-05-13 NOTE — Progress Notes (Signed)
Progress Note  Patient Name: Madison [name] Date of Encounter: 05/13/2020  [facility] Cardiologist: New, Agbor-Etang rounding  Subjective   No acute events overnight, had another coughing spell today with white phlegm production.  Some discomfort noted in lower sternum associated with coughing spell.  No discomfort noted when she walks around all when not having a coughing spell.  Echocardiogram done earlier.  Inpatient Medications    Scheduled Meds:  albuterol  2.5 mg Nebulization Q6H   cefdinir  300 mg Oral Q12H   enoxaparin (LOVENOX) injection  40 mg Subcutaneous Q24H   famotidine  20 mg Oral Daily   fluticasone  2 spray Each Nare Daily   guaiFENesin  600 mg Oral BID   insulin aspart  0-20 Units Subcutaneous TID WC   methylPREDNISolone (SOLU-MEDROL) injection  60 mg Intravenous Q12H   metoprolol tartrate  50 mg Oral BID   Continuous Infusions:  lactated ringers 50 mL/hr at 05/13/20 0710   PRN Meds: acetaminophen **OR** acetaminophen, benzonatate, bisacodyl, polyethylene glycol, sorbitol   Vital Signs    Vitals:   05/12/20 0833 05/12/20 1557 05/12/20 2310 05/13/20 0839  BP:  (!) 169/89 (!) 170/76 (!) 136/103  Pulse: 98 95 (!) 102 84  Resp:  20 16 18   Temp:  97.8 F (36.6 C) 97.6 F (36.4 C) 98 F (36.7 C)  TempSrc:  Oral Oral Oral  SpO2: 99% 98% 98% (!) 85%  Weight:      Height:        Intake/Output Summary (Last 24 hours) at 05/13/2020 1122 Last data filed at 05/13/2020 1016 Gross per 24 hour  Intake 1642.56 ml  Output --  Net 1642.56 ml   Last 3 Weights 05/11/2020 04/22/2020 01/31/2020  Weight (lbs) 204 lb 204 lb 201 lb 15.1 oz  Weight (kg) 92.534 kg 92.534 kg 91.6 kg      Telemetry    Sinus rhythm- Personally Reviewed  ECG    No new tracing- Personally Reviewed  Physical Exam   GEN: No acute distress.   Neck: No JVD Cardiac: RRR, no murmurs, rubs, or gallops.  Respiratory:  Expiratory wheezing GI: Soft, nontender, non-distended   MS: No edema; No deformity. Neuro:  Nonfocal  Psych: Normal affect   Labs    High Sensitivity Troponin:   Recent Labs  Lab 05/11/20 1418 05/11/20 1625 05/12/20 1147 05/12/20 1432 05/13/20 1009  TROPONINIHS 3 3 91* 140* 80*      Chemistry Recent Labs  Lab 05/11/20 1418 05/12/20 0708 05/13/20 1009  NA 138 137 138  K 3.8 4.0 4.1  CL 98 102 101  CO2 30 24 26   GLUCOSE 174* 233* 271*  BUN 15 17 20   CREATININE 0.84 1.09* 0.79  CALCIUM 9.1 9.2 9.6  PROT  --  7.3  --   ALBUMIN  --  4.0  --   AST  --  27  --   ALT  --  22  --   ALKPHOS  --  54  --   BILITOT  --  0.7  --   GFRNONAA >60 51* >60  GFRAA >60 59* >60  ANIONGAP 10 11 11      Hematology Recent Labs  Lab 05/11/20 1418 05/12/20 0708 05/13/20 0454  WBC 9.7 13.0* 16.5*  RBC 4.34 4.13 4.33  HGB 13.4 12.8 13.3  HCT 41.5 39.3 40.5  MCV 95.6 95.2 93.5  MCH 30.9 31.0 30.7  MCHC 32.3 32.6 32.8  RDW 13.5 13.4 13.6  PLT 220  222 245    BNPNo results for input(s): BNP, PROBNP in the last 168 hours.   DDimer No results for input(s): DDIMER in the last 168 hours.   Radiology    DG Chest 2 View  Result Date: 05/11/2020 CLINICAL DATA:  Short of breath EXAM: CHEST - 2 VIEW COMPARISON:  04/22/2020 FINDINGS: Pulmonary hyperinflation. Lungs are clear without infiltrate effusion or edema. Heart size and vascularity normal. IMPRESSION: Pulmonary hyperinflation.  No acute abnormality. Electronically Signed   By: Marlan Palau M.D.   On: 05/11/2020 14:48   ECHOCARDIOGRAM COMPLETE  Result Date: 05/13/2020    ECHOCARDIOGRAM REPORT   Patient Name:   Madison [name] [facility] Date of Exam: 05/13/2020 Medical Rec #:  [id number]         Height:       59.0 in Accession #:    [id number]        Weight:       204.0 lb Date of Birth:  1951/04/12         BSA:          1.860 m Patient Age:    69 years          BP:           170/76 mmHg Patient Gender: F                 HR:           102 bpm. Exam Location:  [facility] Procedure: 2D Echo Indications:      NSTEMI  History:         Patient has no prior history of Echocardiogram examinations.                  COPD; Risk Factors:Hypertension, CKD and Dyslipidemia.  Sonographer:     L Thornton-Maynard Referring Phys:  [id number] GLOVF AMERY Diagnosing Phys: Debbe Odea MD IMPRESSIONS  1. Left ventricular ejection fraction, by estimation, is 60 to 65%. The left ventricle has normal function. The left ventricle has no regional wall motion abnormalities. Left ventricular diastolic parameters are consistent with Grade II diastolic dysfunction (pseudonormalization).  2. Right ventricular systolic function is normal. The right ventricular size is normal.  3. The mitral valve is normal in structure. No evidence of mitral valve regurgitation. No evidence of mitral stenosis.  4. The aortic valve is grossly normal. Aortic valve regurgitation is not visualized. No aortic stenosis is present.  5. The inferior vena cava is normal in size with greater than 50% respiratory variability, suggesting right atrial pressure of 3 mmHg. FINDINGS  Left Ventricle: Left ventricular ejection fraction, by estimation, is 60 to 65%. The left ventricle has normal function. The left ventricle has no regional wall motion abnormalities. The left ventricular internal cavity size was normal in size. There is  no left ventricular hypertrophy. Left ventricular diastolic parameters are consistent with Grade II diastolic dysfunction (pseudonormalization). Right Ventricle: The right ventricular size is normal. No increase in right ventricular wall thickness. Right ventricular systolic function is normal. Left Atrium: Left atrial size was normal in size. Right Atrium: Right atrial size was normal in size. Pericardium: There is no evidence of pericardial effusion. Mitral Valve: The mitral valve is normal in structure. Normal mobility of the mitral valve leaflets. No evidence of mitral valve regurgitation. No evidence of mitral valve stenosis. MV peak gradient,  10.0 mmHg. The mean mitral valve gradient is 6.0 mmHg. Tricuspid Valve: The tricuspid valve is normal in structure. Tricuspid valve regurgitation is  not demonstrated. No evidence of tricuspid stenosis. Aortic Valve: The aortic valve is grossly normal. Aortic valve regurgitation is not visualized. No aortic stenosis is present. Aortic valve mean gradient measures 5.0 mmHg. Aortic valve peak gradient measures 8.9 mmHg. Aortic valve area, by VTI measures 2.50 cm. Pulmonic Valve: The pulmonic valve was not well visualized. Pulmonic valve regurgitation is not visualized. No evidence of pulmonic stenosis. Aorta: The aortic root is normal in size and structure. Venous: The inferior vena cava is normal in size with greater than 50% respiratory variability, suggesting right atrial pressure of 3 mmHg. IAS/Shunts: No atrial level shunt detected by color flow Doppler.  LEFT VENTRICLE PLAX 2D LVIDd:         3.62 cm  Diastology LVIDs:         2.31 cm  LV e' lateral:   5.87 cm/s LV PW:         1.30 cm  LV E/e' lateral: 21.1 LV IVS:        1.24 cm  LV e' medial:    7.29 cm/s LVOT diam:     1.90 cm  LV E/e' medial:  17.0 LV SV:         87 LV SV Index:   47 LVOT Area:     2.84 cm  RIGHT VENTRICLE RV S prime:     11.50 cm/s TAPSE (M-mode): 2.6 cm LEFT ATRIUM             Index LA diam:        3.60 cm 1.94 cm/m LA Vol (A2C):   39.9 ml 21.45 ml/m LA Vol (A4C):   33.1 ml 17.80 ml/m LA Biplane Vol: 36.7 ml 19.73 ml/m  AORTIC VALVE AV Area (Vmax):    2.34 cm AV Area (Vmean):   2.47 cm AV Area (VTI):     2.50 cm AV Vmax:           149.00 cm/s AV Vmean:          108.000 cm/s AV VTI:            0.349 m AV Peak Grad:      8.9 mmHg AV Mean Grad:      5.0 mmHg LVOT Vmax:         123.00 cm/s LVOT Vmean:        94.100 cm/s LVOT VTI:          0.308 m LVOT/AV VTI ratio: 0.88  AORTA Ao Root diam: 2.60 cm MITRAL VALVE MV Area (PHT): 3.42 cm     SHUNTS MV Peak grad:  10.0 mmHg    Systemic VTI:  0.31 m MV Mean grad:  6.0 mmHg     Systemic Diam:  1.90 cm MV Vmax:       1.58 m/s MV Vmean:      115.0 cm/s MV Decel Time: 222 msec MV E velocity: 124.00 cm/s MV A velocity: 143.00 cm/s MV E/A ratio:  0.87 Debbe Odea MD Electronically signed by Debbe Odea MD Signature Date/Time: 05/13/2020/11:21:09 AM    Final     Cardiac Studies   Echo 05/13/2020 1. Left ventricular ejection fraction, by estimation, is 60 to 65%. The  left ventricle has normal function. The left ventricle has no regional  wall motion abnormalities. Left ventricular diastolic parameters are  consistent with Grade II diastolic  dysfunction (pseudonormalization).  2. Right ventricular systolic function is normal. The right ventricular  size is normal.  3. The mitral valve is normal in  structure. No evidence of mitral valve  regurgitation. No evidence of mitral stenosis.  4. The aortic valve is grossly normal. Aortic valve regurgitation is not  visualized. No aortic stenosis is present.  5. The inferior vena cava is normal in size with greater than 50%  respiratory variability, suggesting right atrial pressure of 3 mmHg.   Patient Profile     69 y.o. female with history of COPD, former smoker, presenting with shortness of breath, being seen due to elevated troponins.  Assessment & Plan    1.  Elevated troponins -Levels where 80-->140-->91.  -Most likely demand supply mismatch -Echo with normal systolic function, EF 60 to 65%. -No indication for additional cardiac testing at this time. -Recommend management of pulmonary pathology, shortness of breath, cough.  2.  Dyspnea, hyperinflated lung, elevated lactate -On antibiotics, nebulizers, steroids -Treatment as per primary team  Patient with no current cardiac symptoms, her symptoms are pulmonary in origin.  No additional cardiac testing indicated at this time.  Cardiology will sign off.  Please let us know if additional input is needed.  Thank you for this consult.  Total encounter time 35 minutes   Greater than 50% was spent in counseling and coordination of care with the patient       Signed, Debbe OdeaBrian Agbor-Etang, MD  05/13/2020, 11:22 AM

## 2020-05-13 NOTE — Progress Notes (Signed)
OT Cancellation Note  Patient Details Name: Madison [name] MRN: [id number] DOB: [birth year]   Cancelled Treatment:    Reason Eval/Treat Not Completed: OT screened, no needs identified, will sign off. Per conversation c PT, pt is at baseline mobility/ADLs and has assist at home. Upon arrival pt pleasant and oriented, denies concerns at this time and confirms she is at baseline for I/ADLs. No skilled acute OT needs identified. Will sign off at this time, please re-consult if new needs arise.   Kathie Dike, M.S. OTR/L  05/13/20, 1:56 PM  ascom [phone]

## 2020-05-13 NOTE — Evaluation (Signed)
Physical Therapy Evaluation Patient Details Name: Madison [name] MRN: [id number] DOB: 01/11/1951 Today's Date: 05/13/2020   History of Present Illness  presented to ER secondary to progressive SOB; admitted for management of acute respiratory failure secondary to COPD exacerbation, pseudomonas/staph sinusitis. Noted with mild elevation in troponin after episode of chest pain/tightness previous date, likely demand ischemia per cardiology (mild bump in troponin, now trending down).  Clinical Impression  Upon evaluation, patient alert and oriented; follows commands and agreeable to session.  Bilat UE/LE strength and ROM grossly symmetrical and WFL; no focal weakness appreciated.  Able to complete sit/stand, basic transfers and gait (200') without assist device, sup/mod indep.  Demonstrates reciprocal stepping pattern with good step height/length; mild/moderately antalgic due to previous spinal surgery (baseline for patient). Good cadence and gait speed; no buckling, LOB.  Maintains sats >95% on RA throughout session Appears to be at baseline level of functional ability; no acute PT needs identified at this time.  Do encourage continued mobilization in room/around unit with staff as appropriate throughout remaining stay to maintain functional strength/endurance.  Patient voiced understanding and agreement with plan.    Follow Up Recommendations No PT follow up    Equipment Recommendations       Recommendations for Other Services       Precautions / Restrictions Precautions Precautions: None Restrictions Weight Bearing Restrictions: No      Mobility  Bed Mobility               General bed mobility comments: seated in recliner beginning/end of treatment session  Transfers Overall transfer level: Modified independent Equipment used: None             General transfer comment: good LE strength/control with functional transfers  Ambulation/Gait Ambulation/Gait assistance:  Modified independent (Device/Increase time) Gait Distance (Feet): 200 Feet Assistive device: None       General Gait Details: reciprocal stepping pattern with good step height/length; mild/moderately antalgic due to previous spinal surgery (baseline for patient). Good cadence and gait speed; no buckling, LOB.  Maintains sats >95% on RA throughout session  Stairs            Wheelchair Mobility    Modified Rankin (Stroke Patients Only)       Balance Overall balance assessment: Needs assistance Sitting-balance support: No upper extremity supported;Feet supported Sitting balance-Leahy Scale: Good     Standing balance support: No upper extremity supported Standing balance-Leahy Scale: Good Standing balance comment: good awareness of limits of stability                             Pertinent Vitals/Pain Pain Assessment: No/denies pain    Home Living Family/patient expects to be discharged to:: Private residence Living Arrangements: Children (son living with patient while he is applying for disability) Available Help at Discharge: Family Type of Home: House Home Access: Stairs to enter   [employer] of Steps: 9 front, 3 back; both with bilat rails, close enough to reach both Home Layout: One level Home Equipment: Walker - 2 wheels;Cane - single point;Bedside commode      Prior Function Level of Independence: Independent         Comments: Indep with ADLs, household and community mobilization without assist device; + driving, community errands; no home O2; no fall history.     Hand Dominance        Extremity/Trunk Assessment   Upper Extremity Assessment Upper Extremity Assessment: Overall [facility]  for tasks assessed    Lower Extremity Assessment Lower Extremity Assessment: Overall WFL for tasks assessed       Communication   Communication: No difficulties  Cognition Arousal/Alertness: Awake/alert Behavior During Therapy: WFL for tasks  assessed/performed Overall Cognitive Status: Within Functional Limits for tasks assessed                                        General Comments      Exercises Other Exercises Other Exercises: Reviewed importance of activity pacing and energy conservation upon discharge; patient voiced understanding and agreement.   Assessment/Plan    PT Assessment Patent does not need any further PT services  PT Problem List         PT Treatment Interventions      PT Goals (Current goals can be found in the Care Plan section)  Acute Rehab PT Goals Patient Stated Goal: to return home PT Goal Formulation: With patient Time For Goal Achievement: 05/13/20 Potential to Achieve Goals: Good    Frequency     Barriers to discharge        Co-evaluation               AM-PAC PT "6 Clicks" Mobility  Outcome Measure Help needed turning from your back to your side while in a flat bed without using bedrails?: None Help needed moving from lying on your back to sitting on the side of a flat bed without using bedrails?: None Help needed moving to and from a bed to a chair (including a wheelchair)?: None Help needed standing up from a chair using your arms (e.g., wheelchair or bedside chair)?: None Help needed to walk in hospital room?: None Help needed climbing 3-5 steps with a railing? : None 6 Click Score: 24    End of Session   Activity Tolerance: Patient tolerated treatment well Patient left: in chair;with call bell/phone within reach Nurse Communication: Mobility status PT Visit Diagnosis: Difficulty in walking, not elsewhere classified (R26.2)    Time: 2119-4174 PT Time Calculation (min) (ACUTE ONLY): 20 min   Charges:   PT Evaluation $PT Eval Low Complexity: 1 Low          Burnie Hank H. Manson Passey, PT, DPT, NCS 05/13/20, 11:55 AM [phone]

## 2020-05-13 NOTE — Progress Notes (Signed)
PROGRESS NOTE    Madison CobbCindy Sue [name]  UVO:[id number] DOB: 1951/05/24 DOA: 05/11/2020 PCP: Dione Housekeeperlmedo, Mario Ernesto, MD    Brief Narrative:  The patient is a 69 yr old woman who struggles with COPD. She states that she seems to be in a cycle of exacerbation, steroid tapers and antibiotics, improvement followed by another exacerbation a couple of weeks later. She was [facility] for choking and dysphagia. She was found to have sinusitis with culture positive for pseudomonas and MRSA. She was started on a 14 day course of ceftin and doxycycline. She also has a follow up with pulmonology in the future.  She has a past medical history significant for CKD, urinary retention, PVD, Seasonal allergies, tachycardia, lower extremity edema, and anemia.  In the ED she was found to have a lactic acid of 5.7, tachypnea, wheezes, and tachycardic. She is saturating 94% on 2 L.   She denies fevers, chills, or cough. She is wheezing and short of breath. She   [facility] were consulted to admit the patient for further evaluation and treatment.  Pt reports being vacccinated. covid negative    Consultants:   Cardiology  Procedures:   Antimicrobials:   Doxycycline 9/3   Omnicef 9/3   Subjective: Patient reports breathing is slowly improving.  She was satting 85% on room air.no cp today.    Objective: Vitals:   05/12/20 2310 05/13/20 0839 05/13/20 1145 05/13/20 1537  BP: (!) 170/76 (!) 136/103  (!) 158/73  Pulse: (!) 102 84  83  Resp: 16 18  18   Temp: 97.6 F (36.4 C) 98 F (36.7 C)  97.7 F (36.5 C)  TempSrc: Oral Oral  Oral  SpO2: 98% (!) 85% 95% 100%  Weight:      Height:        Intake/Output Summary (Last 24 hours) at 05/13/2020 1647 Last data filed at 05/13/2020 1016 Gross per 24 hour  Intake 1292.56 ml  Output --  Net 1292.56 ml   Filed Weights   05/11/20 1415  Weight: 92.5 kg    Examination: Less sob with conversation today.  cta no wheezing,  no rales regualr s1/s2 no m/r Soft benign +bs +b/l le edema, chronic venous stasis Mood and affect appropriate for current setting   Data Reviewed: I have personally reviewed following labs and imaging studies  CBC: Recent Labs  Lab 05/11/20 1418 05/12/20 0708 05/13/20 0454  WBC 9.7 13.0* 16.5*  HGB 13.4 12.8 13.3  HCT 41.5 39.3 40.5  MCV 95.6 95.2 93.5  PLT 220 222 245   Basic Metabolic Panel: Recent Labs  Lab 05/11/20 1418 05/12/20 0708 05/13/20 1009  NA 138 137 138  K 3.8 4.0 4.1  CL 98 102 101  CO2 30 24 26   GLUCOSE 174* 233* 271*  BUN 15 17 20   CREATININE 0.84 1.09* 0.79  CALCIUM 9.1 9.2 9.6   GFR: Estimated Creatinine Clearance: 64 mL/min (by C-G formula based on SCr of 0.79 mg/dL). Liver Function Tests: Recent Labs  Lab 05/12/20 0708  AST 27  ALT 22  ALKPHOS 54  BILITOT 0.7  PROT 7.3  ALBUMIN 4.0   No results for input(s): LIPASE, AMYLASE in the last 168 hours. No results for input(s): AMMONIA in the last 168 hours. Coagulation Profile: No results for input(s): INR, PROTIME in the last 168 hours. Cardiac Enzymes: No results for input(s): CKTOTAL, CKMB, CKMBINDEX, TROPONINI in the last 168 hours. BNP (last 3 results) No results for input(s): PROBNP in  the last 8760 hours. HbA1C: Recent Labs    05/12/20 0708  HGBA1C 6.1*   CBG: Recent Labs  Lab 05/13/20 0038 05/13/20 0957 05/13/20 1205  GLUCAP 287* 283* 255*   Lipid Profile: No results for input(s): CHOL, HDL, LDLCALC, TRIG, CHOLHDL, LDLDIRECT in the last 72 hours. Thyroid Function Tests: No results for input(s): TSH, T4TOTAL, FREET4, T3FREE, THYROIDAB in the last 72 hours. Anemia Panel: No results for input(s): VITAMINB12, FOLATE, FERRITIN, TIBC, IRON, RETICCTPCT in the last 72 hours. Sepsis Labs: Recent Labs  Lab 05/12/20 1147 05/12/20 1432 05/13/20 1009 05/13/20 1304  LATICACIDVEN 4.9* 3.2* 3.3* 2.6*    Recent Results (from the past 240 hour(s))  SARS Coronavirus 2 by RT  PCR (hospital order, performed in [facility] hospital lab) Nasopharyngeal Nasopharyngeal Swab     Status: None   Collection Time: 05/11/20 10:38 PM   Specimen: Nasopharyngeal Swab  Result Value Ref Range Status   SARS Coronavirus 2 NEGATIVE NEGATIVE Final    Comment: (NOTE) SARS-CoV-2 target nucleic acids are NOT DETECTED.  The SARS-CoV-2 RNA is generally detectable in upper and lower respiratory specimens during the acute phase of infection. The lowest concentration of SARS-CoV-2 viral copies this assay can detect is 250 copies / mL. A negative result does not preclude SARS-CoV-2 infection and should not be used as the sole basis for treatment or other patient management decisions.  A negative result may occur with improper specimen collection / handling, submission of specimen other than nasopharyngeal swab, presence of viral mutation(s) within the areas targeted by this assay, and inadequate number of viral copies (<250 copies / mL). A negative result must be combined with clinical observations, patient history, and epidemiological information.  Fact Sheet for Patients:   BoilerBrush.com.cy  Fact Sheet for Healthcare Providers: https://pope.com/  This test is not yet approved or  cleared by the [place] FDA and has been authorized for detection and/or diagnosis of SARS-CoV-2 by FDA under an Emergency Use Authorization (EUA).  This EUA will remain in effect (meaning this test can be used) for the duration of the COVID-19 declaration under Section 564(b)(1) of the Act, 21 U.S.C. section 360bbb-3(b)(1), unless the authorization is terminated or revoked sooner.  Performed at [facility], [address] [place], [place] [address]          Radiology Studies: ECHOCARDIOGRAM COMPLETE  Result Date: 05/13/2020    ECHOCARDIOGRAM REPORT   Patient Name:   Madison [name] [facility] Date of Exam: 05/13/2020 Medical Rec #:   [id number]         Height:       59.0 in Accession #:    [id number]        Weight:       204.0 lb Date of Birth:  1951/05/09         BSA:          1.860 m Patient Age:    69 years          BP:           170/76 mmHg Patient Gender: F                 HR:           102 bpm. Exam Location:  [facility] Procedure: 2D Echo Indications:     NSTEMI  History:         Patient has no prior history of Echocardiogram examinations.  COPD; Risk Factors:Hypertension, CKD and Dyslipidemia.  Sonographer:     L Thornton-Maynard Referring Phys:  [id number] JSHFW Manahil Vanzile Diagnosing Phys: Debbe Odea MD IMPRESSIONS  1. Left ventricular ejection fraction, by estimation, is 60 to 65%. The left ventricle has normal function. The left ventricle has no regional wall motion abnormalities. Left ventricular diastolic parameters are consistent with Grade II diastolic dysfunction (pseudonormalization).  2. Right ventricular systolic function is normal. The right ventricular size is normal.  3. The mitral valve is normal in structure. No evidence of mitral valve regurgitation. No evidence of mitral stenosis.  4. The aortic valve is grossly normal. Aortic valve regurgitation is not visualized. No aortic stenosis is present.  5. The inferior vena cava is normal in size with greater than 50% respiratory variability, suggesting right atrial pressure of 3 mmHg. FINDINGS  Left Ventricle: Left ventricular ejection fraction, by estimation, is 60 to 65%. The left ventricle has normal function. The left ventricle has no regional wall motion abnormalities. The left ventricular internal cavity size was normal in size. There is  no left ventricular hypertrophy. Left ventricular diastolic parameters are consistent with Grade II diastolic dysfunction (pseudonormalization). Right Ventricle: The right ventricular size is normal. No increase in right ventricular wall thickness. Right ventricular systolic function is normal. Left Atrium: Left atrial size was  normal in size. Right Atrium: Right atrial size was normal in size. Pericardium: There is no evidence of pericardial effusion. Mitral Valve: The mitral valve is normal in structure. Normal mobility of the mitral valve leaflets. No evidence of mitral valve regurgitation. No evidence of mitral valve stenosis. MV peak gradient, 10.0 mmHg. The mean mitral valve gradient is 6.0 mmHg. Tricuspid Valve: The tricuspid valve is normal in structure. Tricuspid valve regurgitation is not demonstrated. No evidence of tricuspid stenosis. Aortic Valve: The aortic valve is grossly normal. Aortic valve regurgitation is not visualized. No aortic stenosis is present. Aortic valve mean gradient measures 5.0 mmHg. Aortic valve peak gradient measures 8.9 mmHg. Aortic valve area, by VTI measures 2.50 cm. Pulmonic Valve: The pulmonic valve was not well visualized. Pulmonic valve regurgitation is not visualized. No evidence of pulmonic stenosis. Aorta: The aortic root is normal in size and structure. Venous: The inferior vena cava is normal in size with greater than 50% respiratory variability, suggesting right atrial pressure of 3 mmHg. IAS/Shunts: No atrial level shunt detected by color flow Doppler.  LEFT VENTRICLE PLAX 2D LVIDd:         3.62 cm  Diastology LVIDs:         2.31 cm  LV e' lateral:   5.87 cm/s LV PW:         1.30 cm  LV E/e' lateral: 21.1 LV IVS:        1.24 cm  LV e' medial:    7.29 cm/s LVOT diam:     1.90 cm  LV E/e' medial:  17.0 LV SV:         87 LV SV Index:   47 LVOT Area:     2.84 cm  RIGHT VENTRICLE RV S prime:     11.50 cm/s TAPSE (M-mode): 2.6 cm LEFT ATRIUM             Index LA diam:        3.60 cm 1.94 cm/m LA Vol (A2C):   39.9 ml 21.45 ml/m LA Vol (A4C):   33.1 ml 17.80 ml/m LA Biplane Vol: 36.7 ml 19.73 ml/m  AORTIC VALVE AV Area (Vmax):  2.34 cm AV Area (Vmean):   2.47 cm AV Area (VTI):     2.50 cm AV Vmax:           149.00 cm/s AV Vmean:          108.000 cm/s AV VTI:            0.349 m AV Peak  Grad:      8.9 mmHg AV Mean Grad:      5.0 mmHg LVOT Vmax:         123.00 cm/s LVOT Vmean:        94.100 cm/s LVOT VTI:          0.308 m LVOT/AV VTI ratio: 0.88  AORTA Ao Root diam: 2.60 cm MITRAL VALVE MV Area (PHT): 3.42 cm     SHUNTS MV Peak grad:  10.0 mmHg    Systemic VTI:  0.31 m MV Mean grad:  6.0 mmHg     Systemic Diam: 1.90 cm MV Vmax:       1.58 m/s MV Vmean:      115.0 cm/s MV Decel Time: 222 msec MV E velocity: 124.00 cm/s MV A velocity: 143.00 cm/s MV E/A ratio:  0.87 Debbe Odea MD Electronically signed by Debbe Odea MD Signature Date/Time: 05/13/2020/11:21:09 AM    Final         Scheduled Meds: . albuterol  2.5 mg Nebulization Q6H  . cefdinir  300 mg Oral Q12H  . enoxaparin (LOVENOX) injection  40 mg Subcutaneous Q24H  . famotidine  20 mg Oral Daily  . fluticasone  2 spray Each Nare Daily  . furosemide  20 mg Oral Daily  . guaiFENesin  600 mg Oral BID  . insulin aspart  0-20 Units Subcutaneous TID WC  . methylPREDNISolone (SOLU-MEDROL) injection  60 mg Intravenous Q12H  . metoprolol tartrate  50 mg Oral BID   Continuous Infusions: . lactated ringers 50 mL/hr at 05/13/20 0710    Assessment & Plan:   Active Problems:   COPD exacerbation (HCC)   Essential hypertension   Sinusitis   Elevated troponin   Shortness of breath   1.  Acute hypoxic respiratory failure-present on admission She was on 6 L satting 94% now weaned down to room air Less hypoxic. We will have her ambulate on room air to see if she requires oxygen for home upon discharge    2.  COPD exacerbation-likely Pseudomonas sinusitis. Clinically improving, still symptomatic We will continue on IV steroids and IV antibiotics Inhalers/nebs May need short course prednisone taper upon dc.  Needs to f/u with her primary pulmonologist on d/c  3. Hyperglycemia  HA1c 6.1 bg elevated since on iv steroids Refused insulin coverage earlier b/c she states it "will bottom out' She did drink regular  pepcid and was told to not drink regular. She will need further management of this as outpatient by her primary care  4.CP/elevated troponin- likely demand ischemia due to respiratory status Currently chest pain-free and feeling better Troponin initially was elevated with peak 140 now trending down Echo with normal EF without any regional wall motion abnormality Cardiologist input was appreciated.  No indication for additional cardiac testing.    5. Lactic acidosis-due to respiratory status.  Trending downward. No evidence of sepsis.  No fever, or hypotension.  White count elevated due to steroid use. We will continue to trend Monitor for fever  6. Venous statsis dermatitis/peripheral edema Keep legs elevated. May need compression stockings to use as oupt  DVT prophylaxis: Lovenox Code Status: Full Family Communication: None at bedside  Status is: Inpatient  Patient is inpatient as she is requiring IV medications for her medical care.   Dispo: The patient is from: Home              Anticipated d/c is to: Home              Anticipated d/c date is: 1              Patient currently is not medically stable to d/c.Needs iv medication as she is still symptomatic.            LOS: 1 day   Time spent:45 min with >50% on coc    Lynn Ito, MD [facility] Pager [phone]  If 7PM-7AM, please contact night-coverage [email or web address] Password [facility] 05/13/2020, 4:47 PM

## 2020-05-13 NOTE — Progress Notes (Signed)
Nutrition Brief Note  RD received consult per COPD protocol  69 y/o female with past medical history significant for CKD, urinary retention, PVD, seasonal allergies, tachycardia, lower extremity edema and anemia who is admitted with SOB.  Wt Readings from Last 15 Encounters:  05/11/20 92.5 kg  04/22/20 92.5 kg  01/31/20 91.6 kg  01/18/18 91.6 kg  11/05/17 90.7 kg  08/18/17 91.6 kg  06/18/17 91.9 kg  04/23/17 92.1 kg  02/10/17 90.7 kg  12/24/16 90.7 kg  10/22/16 89.8 kg  09/22/16 90.7 kg  08/20/16 89.4 kg  06/23/16 89.8 kg  05/26/16 97.5 kg    Body mass index is 41.2 kg/m. Patient meets criteria for morbid obesity based on current BMI.   Current diet order is CHO modified, patient is consuming approximately 90-100% of meals at this time. Labs and medications reviewed.   No nutrition interventions warranted at this time. If nutrition issues arise, please consult RD.   Betsey Holiday MS, RD, LDN Please refer to [facility] for RD and/or RD on-call/weekend/after hours pager

## 2020-05-14 LAB — BASIC METABOLIC PANEL
Anion gap: 12 (ref 5–15)
BUN: 22 mg/dL (ref 8–23)
CO2: 26 mmol/L (ref 22–32)
Calcium: 9.4 mg/dL (ref 8.9–10.3)
Chloride: 99 mmol/L (ref 98–111)
Creatinine, Ser: 0.95 mg/dL (ref 0.44–1.00)
GFR calc Af Amer: >60 mL/min (ref >60)
GFR calc non Af Amer: >60 mL/min (ref >60)
Glucose, Bld: 302 mg/dL — ABNORMAL HIGH (ref 70–99)
Potassium: 4.1 mmol/L (ref 3.5–5.1)
Sodium: 137 mmol/L (ref 135–145)

## 2020-05-14 LAB — CBC
HCT: 38.4 % (ref 36.0–46.0)
Hemoglobin: 13 g/dL (ref 12.0–15.0)
MCH: 31.4 pg (ref 26.0–34.0)
MCHC: 33.9 g/dL (ref 30.0–36.0)
MCV: 92.8 fL (ref 80.0–100.0)
Platelets: 225 10*3/uL (ref 150–400)
RBC: 4.14 MIL/uL (ref 3.87–5.11)
RDW: 13.5 % (ref 11.5–15.5)
WBC: 13.1 10*3/uL — ABNORMAL HIGH (ref 4.0–10.5)
nRBC: 0 % (ref 0.0–0.2)

## 2020-05-14 LAB — GLUCOSE, CAPILLARY
Glucose-Capillary: 200 mg/dL — ABNORMAL HIGH (ref 70–99)
Glucose-Capillary: 307 mg/dL — ABNORMAL HIGH (ref 70–99)
Glucose-Capillary: 268 mg/dL — ABNORMAL HIGH (ref 70–99)
Glucose-Capillary: 192 mg/dL — ABNORMAL HIGH (ref 70–99)

## 2020-05-14 LAB — LACTIC ACID, PLASMA
Lactic Acid, Venous: 3.3 mmol/L (ref 0.5–1.9)
Lactic Acid, Venous: 4.2 mmol/L (ref 0.5–1.9)

## 2020-05-14 MED ORDER — BACID PO TABS
2.0000 | ORAL_TABLET | Freq: Three times a day (TID) | ORAL | Status: DC
  Filled 2020-05-14 (×2): qty 2

## 2020-05-14 MED ORDER — SODIUM CHLORIDE 0.45 % IV SOLN: INTRAVENOUS | Status: DC

## 2020-05-14 MED ORDER — PREDNISONE 20 MG PO TABS: 20.0000 mg | ORAL_TABLET | Freq: Every day | ORAL | 0 refills | Status: AC

## 2020-05-14 MED ORDER — FAMOTIDINE 20 MG PO TABS
20.0000 mg | ORAL_TABLET | Freq: Every day | ORAL | 0 refills | Status: DC
Start: 2020-05-15 — End: 2022-05-18

## 2020-05-14 MED ORDER — PREDNISONE 20 MG PO TABS: 40.0000 mg | ORAL_TABLET | Freq: Every day | ORAL | 0 refills | Status: AC

## 2020-05-14 MED ORDER — BACID PO TABS: 2.0000 | ORAL_TABLET | Freq: Three times a day (TID) | ORAL | 0 refills | Status: AC

## 2020-05-14 NOTE — Discharge Summary (Signed)
Madison [name] WER:[id number] DOB: 08/14/1951 DOA: 05/11/2020  PCP: Dione Housekeeper, MD  Admit date: 05/11/2020 Discharge date: 05/14/2020  Admitted From: Home Disposition: Home  Recommendations for Outpatient Follow-up:  1. Follow up with PCP in 1 week 2. Please obtain BMP/CBC in one week 3. Primary pulmonologist at [facility] within 1 week     Discharge Condition:Stable CODE STATUS: Full Diet recommendation: Carb control    Brief/Interim Summary: Per admission H&P -the patient is a 69 yr old woman who struggles with COPD. She states that she seems to be in a cycle of exacerbation, steroid tapers and antibiotics, improvement followed by another exacerbation a couple of weeks later. She was recently evaluated by [facility] for choking and dysphagia. She was found to have sinusitis with culture positive for pseudomonas and MRSA. She was started on a 14 day course of ceftin and doxycycline. She also has a follow up with pulmonology in the future. She has a past medical history significant for CKD, urinary retention, PVD, Seasonal allergies, tachycardia, lower extremity edema, and anemia.In the ED she was found to have a lactic acid of 5.7, tachypnea, wheezes, and tachycardic. She is saturating 94% on 2 L.  Covid testing was negative.  She was admitted to hospital service.  1.  Acute hypoxic respiratory failure-present on admission She weaned from 6 L down to room air now. Satting room air 94%.     2.  COPD exacerbation-likely Pseudomonas sinusitis. Clinically has improved Can resume home antibiotics which was given by her doctor Will give her a short course steroid taper Continue home inhalers Will need to follow-up with primary care and primary pulmonologist upon discharge   3. Hyperglycemia -while on IV steroids HA1c 6.1 She did drink regular soda and was told not to drink regular anymore Her sugars decreased and hopefully will continue to remain on lower side with p.o.  prednisone short course taper However she was counseled to follow-up with her primary care for further management    4.CP/elevated troponin- likely demand ischemia due to respiratory status Continues to be chest pain-free.  Troponins trended down Echo with normal EF without any regional wall motion abnormalities No further cardiac work-up/testing per cardiology     5. Lactic acidosis-due to respiratory status.    Keeps trending up and down No evidence of sepsis.  No fever, or hypotension.  WBC has decreased No clinical signs of infection Likely this is from her COPD    6. Venous statsis dermatitis/peripheral edema Keep legs elevated. May need compression stockings to use as oupt   Discharge Diagnoses:  Active Problems:   COPD exacerbation (HCC)   Essential hypertension   Sinusitis   Elevated troponin   Shortness of breath    Discharge Instructions  Discharge Instructions    Diet - low sodium heart healthy   Complete by: As directed    Diet Carb Modified   Complete by: As directed    Discharge instructions   Complete by: As directed    Follow-up primary care in 1 week and discuss sugar levels and medications Follow-up with primary pulmonologist in 1 week   Increase activity slowly   Complete by: As directed      Allergies as of 05/14/2020      Reactions   Eggs Or Egg-derived Products Anaphylaxis   Egg albumin   Morphine And Related    unknown   Fentanyl    Spiriva Handihaler [tiotropium Bromide Monohydrate] Other (See Comments)   bronchiospasms  Sulfa Antibiotics Hives   Penicillins Rash   Has patient had a PCN reaction causing immediate rash, facial/tongue/throat swelling, SOB or lightheadedness with hypotension: YES Has patient had a PCN reaction causing severe rash involving mucus membranes or skin necrosis: NO Has patient had a PCN reaction that required hospitalization NO Has patient had a PCN reaction occurring within the last 10 years: NO If  all of the above answers are "NO", then may proceed with Cephalosporin use.   Voltaren [diclofenac] Rash      Medication List    STOP taking these medications   naproxen 500 MG tablet Commonly known as: NAPROSYN   predniSONE 10 MG (21) Tbpk tablet Commonly known as: STERAPRED UNI-PAK 21 TAB Replaced by: predniSONE 20 MG tablet     TAKE these medications   Advair HFA 230-21 MCG/ACT inhaler Generic drug: fluticasone-salmeterol Inhale 2 puffs into the lungs every 12 (twelve) hours.   albuterol 108 (90 Base) MCG/ACT inhaler Commonly known as: VENTOLIN HFA Inhale 1-2 puffs into the lungs every 4 (four) hours as needed for wheezing or shortness of breath.   albuterol (2.5 MG/3ML) 0.083% nebulizer solution Commonly known as: PROVENTIL SMARTSIG:3 Milliliter(s) Via Nebulizer Every 6 Hours PRN   benzonatate 100 MG capsule Commonly known as: TESSALON Take by mouth 3 (three) times daily as needed for cough.   cefUROXime 500 MG tablet Commonly known as: CEFTIN Take 500 mg by mouth 2 (two) times daily.   dextromethorphan-guaiFENesin 30-600 MG 12hr tablet Commonly known as: MUCINEX DM Take 2 tablets by mouth 2 (two) times daily.   famotidine 20 MG tablet Commonly known as: PEPCID Take 1 tablet (20 mg total) by mouth daily. Start taking on: May 15, 2020   fluticasone 50 MCG/ACT nasal spray Commonly known as: FLONASE Place 2 sprays into both nostrils daily.   furosemide 20 MG tablet Commonly known as: LASIX Take 20 mg by mouth daily.   lactobacillus acidophilus Tabs tablet Take 2 tablets by mouth 3 (three) times daily for 7 days.   metoprolol tartrate 50 MG tablet Commonly known as: LOPRESSOR Take 50 mg by mouth 2 (two) times daily.   potassium chloride SA 20 MEQ tablet Commonly known as: KLOR-CON Take 20 mEq by mouth daily.   predniSONE 20 MG tablet Commonly known as: DELTASONE Take 2 tablets (40 mg total) by mouth daily for 2 days. Start taking on: May 15, 2020 Replaces: predniSONE 10 MG (21) Tbpk tablet   predniSONE 20 MG tablet Commonly known as: DELTASONE Take 1 tablet (20 mg total) by mouth daily for 2 days. Start taking on: May 17, 2020   V-R VITAMIN B-12 500 MCG tablet Generic drug: vitamin B-12 Take 500 mcg by mouth daily.   VITAMIN D3 PO Take by mouth daily.       Follow-up Information    Zada Finders Joycie Peek, MD Follow up in 1 week(s).   Specialty: Family Medicine Contact information: [address] [place] [address] [phone]              Allergies  Allergen Reactions  . Eggs Or Egg-Derived Products Anaphylaxis    Egg albumin  . Morphine And Related     unknown  . Fentanyl   . Spiriva Handihaler [Tiotropium Bromide Monohydrate] Other (See Comments)    bronchiospasms  . Sulfa Antibiotics Hives  . Penicillins Rash    Has patient had a PCN reaction causing immediate rash, facial/tongue/throat swelling, SOB or lightheadedness with hypotension: YES Has patient had a  PCN reaction causing severe rash involving mucus membranes or skin necrosis: NO Has patient had a PCN reaction that required hospitalization NO Has patient had a PCN reaction occurring within the last 10 years: NO If all of the above answers are "NO", then may proceed with Cephalosporin use.  . Voltaren [Diclofenac] Rash    Consultations:  Cardiology   Procedures/Studies: DG Chest 2 View  Result Date: 05/11/2020 CLINICAL DATA:  Short of breath EXAM: CHEST - 2 VIEW COMPARISON:  04/22/2020 FINDINGS: Pulmonary hyperinflation. Lungs are clear without infiltrate effusion or edema. Heart size and vascularity normal. IMPRESSION: Pulmonary hyperinflation.  No acute abnormality. Electronically Signed   By: Marlan Palauharles  Clark M.D.   On: 05/11/2020 14:48   DG Chest Portable 1 View  Result Date: 04/22/2020 CLINICAL DATA:  Shortness of breath EXAM: PORTABLE CHEST 1 VIEW COMPARISON:  Chest radiograph dated 01/31/2020 FINDINGS: The heart is  normal in size. Vascular calcifications are seen in the aortic arch. There are mild bilateral lower lung predominant interstitial opacities. There is no pleural effusion or pneumothorax. Degenerative changes are seen in the spine and shoulders. IMPRESSION: Mild bilateral lower lung predominant interstitial opacities may represent pulmonary edema or atypical infection. Electronically Signed   By: Romona Curlsyler  Litton M.D.   On: 04/22/2020 15:15   ECHOCARDIOGRAM COMPLETE  Result Date: 05/13/2020    ECHOCARDIOGRAM REPORT   Patient Name:   [facility]til[facility] Date of Exam: 05/13/2020 Medical Rec #:  [id number]         Height:       59.0 in Accession #:    [id number]        Weight:       204.0 lb Date of Birth:  Nov 14, 1950         BSA:          1.860 m Patient Age:    69 years          BP:           170/76 mmHg Patient Gender: F                 HR:           102 bpm. Exam Location:  [facility] Procedure: 2D Echo Indications:     NSTEMI  History:         Patient has no prior history of Echocardiogram examinations.                  COPD; Risk Factors:Hypertension, CKD and Dyslipidemia.  Sonographer:     L Thornton-Maynard Referring Phys:  [id number] Alean Kromer Diagnosing Phys: Debbe OdeaBrian Agbor-Etang MD IMPRESSIONS  1. Left ventricular ejection fraction, by estimation, is 60 to 65%. The left ventricle has normal function. The left ventricle has no regional wall motion abnormalities. Left ventricular diastolic parameters are consistent with Grade II diastolic dysfunction (pseudonormalization).  2. Right ventricular systolic function is normal. The right ventricular size is normal.  3. The mitral valve is normal in structure. No evidence of mitral valve regurgitation. No evidence of mitral stenosis.  4. The aortic valve is grossly normal. Aortic valve regurgitation is not visualized. No aortic stenosis is present.  5. The inferior vena cava is normal in size with greater than 50% respiratory variability, suggesting right atrial pressure of 3 mmHg.  FINDINGS  Left Ventricle: Left ventricular ejection fraction, by estimation, is 60 to 65%. The left ventricle has normal function. The left ventricle has no regional wall motion abnormalities. The left ventricular internal cavity  size was normal in size. There is  no left ventricular hypertrophy. Left ventricular diastolic parameters are consistent with Grade II diastolic dysfunction (pseudonormalization). Right Ventricle: The right ventricular size is normal. No increase in right ventricular wall thickness. Right ventricular systolic function is normal. Left Atrium: Left atrial size was normal in size. Right Atrium: Right atrial size was normal in size. Pericardium: There is no evidence of pericardial effusion. Mitral Valve: The mitral valve is normal in structure. Normal mobility of the mitral valve leaflets. No evidence of mitral valve regurgitation. No evidence of mitral valve stenosis. MV peak gradient, 10.0 mmHg. The mean mitral valve gradient is 6.0 mmHg. Tricuspid Valve: The tricuspid valve is normal in structure. Tricuspid valve regurgitation is not demonstrated. No evidence of tricuspid stenosis. Aortic Valve: The aortic valve is grossly normal. Aortic valve regurgitation is not visualized. No aortic stenosis is present. Aortic valve mean gradient measures 5.0 mmHg. Aortic valve peak gradient measures 8.9 mmHg. Aortic valve area, by VTI measures 2.50 cm. Pulmonic Valve: The pulmonic valve was not well visualized. Pulmonic valve regurgitation is not visualized. No evidence of pulmonic stenosis. Aorta: The aortic root is normal in size and structure. Venous: The inferior vena cava is normal in size with greater than 50% respiratory variability, suggesting right atrial pressure of 3 mmHg. IAS/Shunts: No atrial level shunt detected by color flow Doppler.  LEFT VENTRICLE PLAX 2D LVIDd:         3.62 cm  Diastology LVIDs:         2.31 cm  LV e' lateral:   5.87 cm/s LV PW:         1.30 cm  LV E/e' lateral: 21.1  LV IVS:        1.24 cm  LV e' medial:    7.29 cm/s LVOT diam:     1.90 cm  LV E/e' medial:  17.0 LV SV:         87 LV SV Index:   47 LVOT Area:     2.84 cm  RIGHT VENTRICLE RV S prime:     11.50 cm/s TAPSE (M-mode): 2.6 cm LEFT ATRIUM             Index LA diam:        3.60 cm 1.94 cm/m LA Vol (A2C):   39.9 ml 21.45 ml/m LA Vol (A4C):   33.1 ml 17.80 ml/m LA Biplane Vol: 36.7 ml 19.73 ml/m  AORTIC VALVE AV Area (Vmax):    2.34 cm AV Area (Vmean):   2.47 cm AV Area (VTI):     2.50 cm AV Vmax:           149.00 cm/s AV Vmean:          108.000 cm/s AV VTI:            0.349 m AV Peak Grad:      8.9 mmHg AV Mean Grad:      5.0 mmHg LVOT Vmax:         123.00 cm/s LVOT Vmean:        94.100 cm/s LVOT VTI:          0.308 m LVOT/AV VTI ratio: 0.88  AORTA Ao Root diam: 2.60 cm MITRAL VALVE MV Area (PHT): 3.42 cm     SHUNTS MV Peak grad:  10.0 mmHg    Systemic VTI:  0.31 m MV Mean grad:  6.0 mmHg     Systemic Diam: 1.90 cm MV Vmax:  1.58 m/s MV Vmean:      115.0 cm/s MV Decel Time: 222 msec MV E velocity: 124.00 cm/s MV A velocity: 143.00 cm/s MV E/A ratio:  0.87 Debbe Odea MD Electronically signed by Debbe Odea MD Signature Date/Time: 05/13/2020/11:21:09 AM    Final        Subjective: Feels much better today.  Ambulating in room air satting 97% and higher.  States no shortness of breath.  Coughed up bunch of sputum yesterday which made her feel better.  No chest pain.  Discharge Exam: Vitals:   05/13/20 2133 05/14/20 0823  BP: (!) 155/73 (!) 154/66  Pulse: 85 96  Resp:  20  Temp: 97.7 F (36.5 C) 97.7 F (36.5 C)  SpO2: 98% 98%   Vitals:   05/13/20 1145 05/13/20 1537 05/13/20 2133 05/14/20 0823  BP:  (!) 158/73 (!) 155/73 (!) 154/66  Pulse:  83 85 96  Resp:  18  20  Temp:  97.7 F (36.5 C) 97.7 F (36.5 C) 97.7 F (36.5 C)  TempSrc:  Oral Oral Oral  SpO2: 95% 100% 98% 98%  Weight:      Height:        General: Pt is alert, awake, not in acute distress Cardiovascular:  RRR, S1/S2 +, no rubs, no gallops Respiratory: CTA bilaterally, no wheezing, no rhonchi Abdominal: Soft, NT, ND, bowel sounds + Extremities: Chronic skin changes and venous stasis, positive edema of lower extremity bilaterally    The results of significant diagnostics from this hospitalization (including imaging, microbiology, ancillary and laboratory) are listed below for reference.     Microbiology: Recent Results (from the past 240 hour(s))  SARS Coronavirus 2 by RT PCR (hospital order, performed in [facility] hospital lab) Nasopharyngeal Nasopharyngeal Swab     Status: None   Collection Time: 05/11/20 10:38 PM   Specimen: Nasopharyngeal Swab  Result Value Ref Range Status   SARS Coronavirus 2 NEGATIVE NEGATIVE Final    Comment: (NOTE) SARS-CoV-2 target nucleic acids are NOT DETECTED.  The SARS-CoV-2 RNA is generally detectable in upper and lower respiratory specimens during the acute phase of infection. The lowest concentration of SARS-CoV-2 viral copies this assay can detect is 250 copies / mL. A negative result does not preclude SARS-CoV-2 infection and should not be used as the sole basis for treatment or other patient management decisions.  A negative result may occur with improper specimen collection / handling, submission of specimen other than nasopharyngeal swab, presence of viral mutation(s) within the areas targeted by this assay, and inadequate number of viral copies (<250 copies / mL). A negative result must be combined with clinical observations, patient history, and epidemiological information.  Fact Sheet for Patients:   BoilerBrush.com.cy  Fact Sheet for Healthcare Providers: https://pope.com/  This test is not yet approved or  cleared by the [place] FDA and has been authorized for detection and/or diagnosis of SARS-CoV-2 by FDA under an Emergency Use Authorization (EUA).  This EUA will remain in effect  (meaning this test can be used) for the duration of the COVID-19 declaration under Section 564(b)(1) of the Act, 21 U.S.C. section 360bbb-3(b)(1), unless the authorization is terminated or revoked sooner.  Performed at [facility], [address] [place], [place] [address]      Labs: BNP (last 3 results) Recent Labs    04/22/20 1431 05/13/20 1304  BNP 58.3 248.4*   Basic Metabolic Panel: Recent Labs  Lab 05/11/20 1418 05/12/20 0708 05/13/20 1009 05/14/20 0820  NA 138 137 138 137  K 3.8 4.0 4.1 4.1  CL 98 102 101 99  CO2 GLUCOSE 174* 233* 271* 302*  BUN CREATININE 0.84 1.09* 0.79 0.95  CALCIUM 9.1 9.2 9.6 9.4   Liver Function Tests: Recent Labs  Lab 05/12/20 0708  AST 27  ALT 22  ALKPHOS 54  BILITOT 0.7  PROT 7.3  ALBUMIN 4.0   No results for input(s): LIPASE, AMYLASE in the last 168 hours. No results for input(s): AMMONIA in the last 168 hours. CBC: Recent Labs  Lab 05/11/20 1418 05/12/20 0708 05/13/20 0454 05/14/20 0450  WBC 9.7 13.0* 16.5* 13.1*  HGB 13.4 12.8 13.3 13.0  HCT 41.5 39.3 40.5 38.4  MCV 95.6 95.2 93.5 92.8  PLT 220 222 245 225   Cardiac Enzymes: No results for input(s): CKTOTAL, CKMB, CKMBINDEX, TROPONINI in the last 168 hours. BNP: Invalid input(s): POCBNP CBG: Recent Labs  Lab 05/13/20 1706 05/14/20 0749 05/14/20 1137 05/14/20 1334 05/14/20 1432  GLUCAP 232* 200* 307* 268* 192*   D-Dimer No results for input(s): DDIMER in the last 72 hours. Hgb A1c Recent Labs    05/12/20 0708  HGBA1C 6.1*   Lipid Profile No results for input(s): CHOL, HDL, LDLCALC, TRIG, CHOLHDL, LDLDIRECT in the last 72 hours. Thyroid function studies No results for input(s): TSH, T4TOTAL, T3FREE, THYROIDAB in the last 72 hours.  Invalid input(s): FREET3 Anemia work up No results for input(s): VITAMINB12, FOLATE, FERRITIN, TIBC, IRON, RETICCTPCT in the last 72 hours. Urinalysis    Component Value  Date/Time   COLORURINE Yellow 09/27/2014 1820   APPEARANCEUR Hazy 09/27/2014 1820   LABSPEC 1.025 09/27/2014 1820   PHURINE 5.0 09/27/2014 1820   GLUCOSEU Negative 09/27/2014 1820   HGBUR 1+ 09/27/2014 1820   BILIRUBINUR 1+ 09/27/2014 1820   KETONESUR Negative 09/27/2014 1820   PROTEINUR 100 mg/dL 28/41/3244 0102   NITRITE Negative 09/27/2014 1820   LEUKOCYTESUR Negative 09/27/2014 1820   Sepsis Labs Invalid input(s): PROCALCITONIN,  WBC,  LACTICIDVEN Microbiology Recent Results (from the past 240 hour(s))  SARS Coronavirus 2 by RT PCR (hospital order, performed in [facility] hospital lab) Nasopharyngeal Nasopharyngeal Swab     Status: None   Collection Time: 05/11/20 10:38 PM   Specimen: Nasopharyngeal Swab  Result Value Ref Range Status   SARS Coronavirus 2 NEGATIVE NEGATIVE Final    Comment: (NOTE) SARS-CoV-2 target nucleic acids are NOT DETECTED.  The SARS-CoV-2 RNA is generally detectable in upper and lower respiratory specimens during the acute phase of infection. The lowest concentration of SARS-CoV-2 viral copies this assay can detect is 250 copies / mL. A negative result does not preclude SARS-CoV-2 infection and should not be used as the sole basis for treatment or other patient management decisions.  A negative result may occur with improper specimen collection / handling, submission of specimen other than nasopharyngeal swab, presence of viral mutation(s) within the areas targeted by this assay, and inadequate number of viral copies (<250 copies / mL). A negative result must be combined with clinical observations, patient history, and epidemiological information.  Fact Sheet for Patients:   BoilerBrush.com.cy  Fact Sheet for Healthcare Providers: https://pope.com/  This test is not yet approved or  cleared by the [place] FDA and has been authorized for detection and/or diagnosis of SARS-CoV-2 by FDA under  an Emergency Use Authorization (EUA).  This EUA will remain in effect (meaning this test can be used) for the  duration of the COVID-19 declaration under Section 564(b)(1) of the Act, 21 U.S.C. section 360bbb-3(b)(1), unless the authorization is terminated or revoked sooner.  Performed at [facility], [address] [place], [place] [address]      Time coordinating discharge: Over 30 minutes  SIGNED:   Lynn Ito, MD  [facility] 05/14/2020, 3:18 PM Pager   If 7PM-7AM, please contact night-coverage [email or web address] Password [email or web address]

## 2020-05-14 NOTE — Progress Notes (Signed)
Pt ambulated with me around the nurses station at a brisk walk on room air and her Sp02 was 97% or higher.

## 2021-11-21 ENCOUNTER — Ambulatory Visit
Admission: RE | Admit: 2021-11-21 | Discharge: 2021-11-21 | Disposition: A | Discharge status: Home / Self Care | Payer: Self-pay | Attending: Family Medicine | Admitting: Family Medicine

## 2021-11-21 ENCOUNTER — Ambulatory Visit
Admission: RE | Admit: 2021-11-21 | Discharge: 2021-11-21 | Disposition: A | Discharge status: Home / Self Care | Payer: Self-pay | Source: Ambulatory Visit | Attending: Family Medicine | Admitting: Family Medicine

## 2021-11-21 ENCOUNTER — Other Ambulatory Visit: Payer: Self-pay | Admitting: Family Medicine

## 2021-11-21 DIAGNOSIS — R051 Acute cough: Secondary | ICD-10-CM | POA: Insufficient documentation

## 2021-11-26 DIAGNOSIS — I480 Paroxysmal atrial fibrillation: Secondary | ICD-10-CM | POA: Diagnosis present

## 2022-05-18 ENCOUNTER — Observation Stay
Admission: EM | Admit: 2022-05-18 | Discharge: 2022-05-19 | Disposition: A | Discharge status: Home / Self Care | Payer: Self-pay | Attending: Internal Medicine | Admitting: Internal Medicine

## 2022-05-18 ENCOUNTER — Encounter: Payer: Self-pay | Admitting: Internal Medicine

## 2022-05-18 ENCOUNTER — Observation Stay (HOSPITAL_BASED_OUTPATIENT_CLINIC_OR_DEPARTMENT_OTHER)
Admit: 2022-05-18 | Discharge: 2022-05-18 | Disposition: A | Discharge status: Home / Self Care | Payer: Self-pay | Attending: Internal Medicine | Admitting: Internal Medicine

## 2022-05-18 ENCOUNTER — Other Ambulatory Visit: Payer: Self-pay

## 2022-05-18 ENCOUNTER — Emergency Department: Payer: Self-pay

## 2022-05-18 DIAGNOSIS — I129 Hypertensive chronic kidney disease with stage 1 through stage 4 chronic kidney disease, or unspecified chronic kidney disease: Secondary | ICD-10-CM | POA: Insufficient documentation

## 2022-05-18 DIAGNOSIS — Z9981 Dependence on supplemental oxygen: Secondary | ICD-10-CM

## 2022-05-18 DIAGNOSIS — E119 Type 2 diabetes mellitus without complications: Secondary | ICD-10-CM

## 2022-05-18 DIAGNOSIS — I10 Essential (primary) hypertension: Secondary | ICD-10-CM

## 2022-05-18 DIAGNOSIS — Z87891 Personal history of nicotine dependence: Secondary | ICD-10-CM | POA: Insufficient documentation

## 2022-05-18 DIAGNOSIS — J9611 Chronic respiratory failure with hypoxia: Secondary | ICD-10-CM

## 2022-05-18 DIAGNOSIS — N189 Chronic kidney disease, unspecified: Secondary | ICD-10-CM | POA: Insufficient documentation

## 2022-05-18 DIAGNOSIS — R600 Localized edema: Secondary | ICD-10-CM

## 2022-05-18 DIAGNOSIS — Z79899 Other long term (current) drug therapy: Secondary | ICD-10-CM | POA: Insufficient documentation

## 2022-05-18 DIAGNOSIS — Z20822 Contact with and (suspected) exposure to covid-19: Secondary | ICD-10-CM | POA: Insufficient documentation

## 2022-05-18 DIAGNOSIS — J9601 Acute respiratory failure with hypoxia: Secondary | ICD-10-CM

## 2022-05-18 DIAGNOSIS — J9621 Acute and chronic respiratory failure with hypoxia: Secondary | ICD-10-CM

## 2022-05-18 DIAGNOSIS — E1169 Type 2 diabetes mellitus with other specified complication: Secondary | ICD-10-CM

## 2022-05-18 DIAGNOSIS — J441 Chronic obstructive pulmonary disease with (acute) exacerbation: Principal | ICD-10-CM | POA: Diagnosis present

## 2022-05-18 DIAGNOSIS — R2240 Localized swelling, mass and lump, unspecified lower limb: Secondary | ICD-10-CM | POA: Insufficient documentation

## 2022-05-18 DIAGNOSIS — I480 Paroxysmal atrial fibrillation: Secondary | ICD-10-CM

## 2022-05-18 DIAGNOSIS — E1122 Type 2 diabetes mellitus with diabetic chronic kidney disease: Secondary | ICD-10-CM | POA: Insufficient documentation

## 2022-05-18 DIAGNOSIS — Z7951 Long term (current) use of inhaled steroids: Secondary | ICD-10-CM | POA: Insufficient documentation

## 2022-05-18 DIAGNOSIS — Z7901 Long term (current) use of anticoagulants: Secondary | ICD-10-CM | POA: Insufficient documentation

## 2022-05-18 DIAGNOSIS — R0600 Dyspnea, unspecified: Secondary | ICD-10-CM

## 2022-05-18 LAB — CBC WITH DIFFERENTIAL/PLATELET
Abs Immature Granulocytes: 0.03 10*3/uL (ref 0.00–0.07)
Basophils Absolute: 0.1 10*3/uL (ref 0.0–0.1)
Basophils Relative: 1 %
Eosinophils Absolute: 0.7 10*3/uL — ABNORMAL HIGH (ref 0.0–0.5)
Eosinophils Relative: 9 %
HCT: 47.6 % — ABNORMAL HIGH (ref 36.0–46.0)
Hemoglobin: 15.1 g/dL — ABNORMAL HIGH (ref 12.0–15.0)
Immature Granulocytes: 0 %
Lymphocytes Relative: 25 %
Lymphs Abs: 1.9 10*3/uL (ref 0.7–4.0)
MCH: 29.5 pg (ref 26.0–34.0)
MCHC: 31.7 g/dL (ref 30.0–36.0)
MCV: 93 fL (ref 80.0–100.0)
Monocytes Absolute: 0.5 10*3/uL (ref 0.1–1.0)
Monocytes Relative: 6 %
Neutro Abs: 4.7 10*3/uL (ref 1.7–7.7)
Neutrophils Relative %: 59 %
Platelets: 190 10*3/uL (ref 150–400)
RBC: 5.12 MIL/uL — ABNORMAL HIGH (ref 3.87–5.11)
RDW: 12.6 % (ref 11.5–15.5)
WBC: 7.9 10*3/uL (ref 4.0–10.5)
nRBC: 0 % (ref 0.0–0.2)

## 2022-05-18 LAB — COMPREHENSIVE METABOLIC PANEL
ALT: 60 U/L — ABNORMAL HIGH (ref 0–44)
AST: 74 U/L — ABNORMAL HIGH (ref 15–41)
Albumin: 4.2 g/dL (ref 3.5–5.0)
Alkaline Phosphatase: 110 U/L (ref 38–126)
Anion gap: 9 (ref 5–15)
BUN: 15 mg/dL (ref 8–23)
CO2: 28 mmol/L (ref 22–32)
Calcium: 9.1 mg/dL (ref 8.9–10.3)
Chloride: 102 mmol/L (ref 98–111)
Creatinine, Ser: 0.8 mg/dL (ref 0.44–1.00)
GFR, Estimated: >60 mL/min (ref >60)
Glucose, Bld: 197 mg/dL — ABNORMAL HIGH (ref 70–99)
Potassium: 3.8 mmol/L (ref 3.5–5.1)
Sodium: 139 mmol/L (ref 135–145)
Total Bilirubin: 0.8 mg/dL (ref 0.3–1.2)
Total Protein: 7.4 g/dL (ref 6.5–8.1)

## 2022-05-18 LAB — BRAIN NATRIURETIC PEPTIDE: B Natriuretic Peptide: 59.7 pg/mL (ref 0.0–100.0)

## 2022-05-18 LAB — BLOOD GAS, ARTERIAL
Acid-Base Excess: 3.2 mmol/L — ABNORMAL HIGH (ref 0.0–2.0)
Bicarbonate: 28.5 mmol/L — ABNORMAL HIGH (ref 20.0–28.0)
Expiratory PAP: 5 cmH2O
FIO2: 40 %
Inspiratory PAP: 10 cmH2O
O2 Saturation: 100 %
Patient temperature: 37
pCO2 arterial: 45 mmHg (ref 32–48)
pH, Arterial: 7.41 (ref 7.35–7.45)
pO2, Arterial: 156 mmHg — ABNORMAL HIGH (ref 83–108)

## 2022-05-18 LAB — TROPONIN I (HIGH SENSITIVITY)
Troponin I (High Sensitivity): 5 ng/L (ref <18)
Troponin I (High Sensitivity): 21 ng/L — ABNORMAL HIGH (ref <18)
Troponin I (High Sensitivity): 21 ng/L — ABNORMAL HIGH (ref <18)

## 2022-05-18 LAB — GLUCOSE, CAPILLARY
Glucose-Capillary: 280 mg/dL — ABNORMAL HIGH (ref 70–99)
Glucose-Capillary: 97 mg/dL (ref 70–99)

## 2022-05-18 LAB — SARS CORONAVIRUS 2 BY RT PCR: SARS Coronavirus 2 by RT PCR: NEGATIVE

## 2022-05-18 LAB — PROCALCITONIN: Procalcitonin: <0.1 ng/mL

## 2022-05-18 LAB — EXPECTORATED SPUTUM ASSESSMENT W GRAM STAIN, RFLX TO RESP C

## 2022-05-18 LAB — HEMOGLOBIN A1C
Hgb A1c MFr Bld: 8.2 % — ABNORMAL HIGH (ref 4.8–5.6)
Mean Plasma Glucose: 188.64 mg/dL

## 2022-05-18 MED ORDER — ALBUTEROL SULFATE (2.5 MG/3ML) 0.083% IN NEBU: 5.0000 mg | INHALATION_SOLUTION | Freq: Once | RESPIRATORY_TRACT | Status: DC

## 2022-05-18 MED ORDER — ALBUTEROL SULFATE (2.5 MG/3ML) 0.083% IN NEBU
2.5000 mg | INHALATION_SOLUTION | RESPIRATORY_TRACT | Status: DC | PRN
  Administered 2022-05-18: 2.5 mg via RESPIRATORY_TRACT
  Filled 2022-05-18: qty 3

## 2022-05-18 MED ORDER — IPRATROPIUM BROMIDE 0.02 % IN SOLN
0.5000 mg | Freq: Once | RESPIRATORY_TRACT | Status: AC
  Administered 2022-05-18: 0.5 mg via RESPIRATORY_TRACT
  Filled 2022-05-18: qty 2.5

## 2022-05-18 MED ORDER — AZITHROMYCIN 250 MG PO TABS
250.0000 mg | ORAL_TABLET | Freq: Every day | ORAL | Status: DC
  Administered 2022-05-19: 250 mg via ORAL
  Filled 2022-05-18: qty 1

## 2022-05-18 MED ORDER — DM-GUAIFENESIN ER 30-600 MG PO TB12
1.0000 | ORAL_TABLET | Freq: Two times a day (BID) | ORAL | Status: DC
  Administered 2022-05-18 – 2022-05-19 (×2): 1 via ORAL
  Filled 2022-05-18 (×3): qty 1

## 2022-05-18 MED ORDER — ONDANSETRON HCL 4 MG PO TABS: 4.0000 mg | ORAL_TABLET | Freq: Four times a day (QID) | ORAL | Status: DC | PRN

## 2022-05-18 MED ORDER — METHYLPREDNISOLONE SODIUM SUCC 40 MG IJ SOLR: 40.0000 mg | Freq: Two times a day (BID) | INTRAMUSCULAR | Status: DC

## 2022-05-18 MED ORDER — ALBUTEROL SULFATE (2.5 MG/3ML) 0.083% IN NEBU
INHALATION_SOLUTION | RESPIRATORY_TRACT | Status: AC
  Administered 2022-05-18: 2.5 mg via RESPIRATORY_TRACT
  Filled 2022-05-18: qty 3

## 2022-05-18 MED ORDER — INSULIN ASPART 100 UNIT/ML IJ SOLN: 0.0000 [IU] | Freq: Every day | INTRAMUSCULAR | Status: DC

## 2022-05-18 MED ORDER — ACETAMINOPHEN 325 MG PO TABS
650.0000 mg | ORAL_TABLET | Freq: Four times a day (QID) | ORAL | Status: DC | PRN
  Administered 2022-05-18 – 2022-05-19 (×2): 650 mg via ORAL
  Filled 2022-05-18 (×2): qty 2

## 2022-05-18 MED ORDER — FAMOTIDINE 20 MG PO TABS
20.0000 mg | ORAL_TABLET | Freq: Every day | ORAL | Status: DC
  Administered 2022-05-18 – 2022-05-19 (×2): 20 mg via ORAL
  Filled 2022-05-18 (×2): qty 1

## 2022-05-18 MED ORDER — PREDNISONE 20 MG PO TABS
40.0000 mg | ORAL_TABLET | Freq: Every day | ORAL | Status: DC
  Administered 2022-05-19: 40 mg via ORAL
  Filled 2022-05-18: qty 2

## 2022-05-18 MED ORDER — ONDANSETRON HCL 4 MG/2ML IJ SOLN: 4.0000 mg | Freq: Four times a day (QID) | INTRAMUSCULAR | Status: DC | PRN

## 2022-05-18 MED ORDER — ALBUTEROL SULFATE (2.5 MG/3ML) 0.083% IN NEBU: 2.5000 mg | INHALATION_SOLUTION | Freq: Four times a day (QID) | RESPIRATORY_TRACT | Status: DC

## 2022-05-18 MED ORDER — AZITHROMYCIN 500 MG PO TABS
500.0000 mg | ORAL_TABLET | Freq: Every day | ORAL | Status: AC
  Administered 2022-05-18: 500 mg via ORAL
  Filled 2022-05-18: qty 1

## 2022-05-18 MED ORDER — MAGNESIUM SULFATE 2 GM/50ML IV SOLN
2.0000 g | Freq: Once | INTRAVENOUS | Status: AC
  Administered 2022-05-18: 2 g via INTRAVENOUS
  Filled 2022-05-18: qty 50

## 2022-05-18 MED ORDER — FUROSEMIDE 20 MG PO TABS
20.0000 mg | ORAL_TABLET | Freq: Every day | ORAL | Status: DC
  Administered 2022-05-18 – 2022-05-19 (×2): 20 mg via ORAL
  Filled 2022-05-18 (×2): qty 1

## 2022-05-18 MED ORDER — METOPROLOL TARTRATE 50 MG PO TABS
50.0000 mg | ORAL_TABLET | Freq: Two times a day (BID) | ORAL | Status: DC
  Administered 2022-05-18 – 2022-05-19 (×3): 50 mg via ORAL
  Filled 2022-05-18 (×3): qty 1

## 2022-05-18 MED ORDER — LORAZEPAM 2 MG/ML IJ SOLN: 0.5000 mg | Freq: Once | INTRAMUSCULAR | Status: DC

## 2022-05-18 MED ORDER — RIVAROXABAN 20 MG PO TABS
20.0000 mg | ORAL_TABLET | Freq: Every day | ORAL | Status: DC
  Administered 2022-05-18: 20 mg via ORAL
  Filled 2022-05-18 (×2): qty 1

## 2022-05-18 MED ORDER — METHYLPREDNISOLONE SODIUM SUCC 125 MG IJ SOLR: 125.0000 mg | Freq: Once | INTRAMUSCULAR | Status: DC

## 2022-05-18 MED ORDER — ACETAMINOPHEN 650 MG RE SUPP: 650.0000 mg | Freq: Four times a day (QID) | RECTAL | Status: DC | PRN

## 2022-05-18 MED ORDER — INSULIN ASPART 100 UNIT/ML IJ SOLN
0.0000 [IU] | Freq: Three times a day (TID) | INTRAMUSCULAR | Status: DC
  Administered 2022-05-18: 8 [IU] via SUBCUTANEOUS
  Administered 2022-05-19: 3 [IU] via SUBCUTANEOUS
  Filled 2022-05-18 (×2): qty 1

## 2022-05-18 MED ORDER — IPRATROPIUM BROMIDE 0.02 % IN SOLN: 0.5000 mg | Freq: Once | RESPIRATORY_TRACT | Status: DC

## 2022-05-18 MED ORDER — PERFLUTREN LIPID MICROSPHERE
1.0000 mL | INTRAVENOUS | Status: AC | PRN
  Administered 2022-05-18: 4 mL via INTRAVENOUS

## 2022-05-18 MED ORDER — ALBUTEROL SULFATE (2.5 MG/3ML) 0.083% IN NEBU
5.0000 mg | INHALATION_SOLUTION | Freq: Once | RESPIRATORY_TRACT | Status: AC
  Administered 2022-05-18: 5 mg via RESPIRATORY_TRACT
  Filled 2022-05-18: qty 6

## 2022-05-18 MED ORDER — IPRATROPIUM-ALBUTEROL 0.5-2.5 (3) MG/3ML IN SOLN
3.0000 mL | Freq: Four times a day (QID) | RESPIRATORY_TRACT | Status: DC
  Administered 2022-05-18 – 2022-05-19 (×5): 3 mL via RESPIRATORY_TRACT
  Filled 2022-05-18 (×6): qty 3

## 2022-05-18 NOTE — Assessment & Plan Note (Signed)
Chronic. BNP normal.

## 2022-05-18 NOTE — Subjective & Objective (Signed)
CC: SOB, wheezing HPI: 71 yo WF with hx of COPD, chronic hypoxic respiratory failure on home oxygen, hypertension, morbid obesity with BMI 48, history of paroxysmal atrial fibrillation on Xarelto presents to the ER today with shortness of breath.  This has been worsening over several weeks.  Finally came to the ER tonight at the urging of her daughter due to continued respiratory distress at home.  Daughter states the patient has been short of breath for several months now.  She refused to go to see her doctors.  She had seen a pulmonologist at the end of 2021 but has since not followed up with any of her appointments.  She has not had any fever or chills.  Daughter states that she coughs up mucous plugs in a daily basis.  She states that when she is having trouble difficulty is usually due to mucous plugging.  She has been using her nebulizer treatments without much success at home.  Patient's last treatment with steroids was several months ago.  Patient called EMS today due to worsening breathing.  Per EDP report, patient had O2 saturations of 86% on 2 L.  She was tried on CPAP per EMS but the patient refused.  Here in the ER, ED provider gave the patient a choice of either trying BiPAP or being intubated due to her respiratory distress.  Patient finally agreed to BiPAP.  Chest x-ray negative for pneumonia.  BNP normal at 59  Procalcitonin less than 0.10  White count 7.9, hemoglobin 15.1, platelets of 190  Sodium 139, potassium, glucose of 197, creatinine 0.8  COVID-negative.  [facility] contacted for admission.

## 2022-05-18 NOTE — ED Provider Notes (Signed)
[facility] Provider Note    Event Date/Time   First MD Initiated Contact with Patient 05/18/22 0024     (approximate)   History   Shortness of Breath   HPI  Madison [name] is a 71 y.o. female with history of COPD, hyperlipidemia, hypertension, A-fib on Xarelto who presents to the emergency department EMS for shortness of breath.  Ports she has had cough, congestion and shortness of breath for a month but it worsened today.  No chest pain.  No fever.  Wears 2 L of oxygen at home.  Was 86% on 2 L on EMS arrival.  They gave DuoNeb and 125 mg of IV Solu-Medrol.  Tried to place her on CPAP but she was not able to tolerate it.   History provided by patient and EMS.    Past Medical History:  Diagnosis Date   Anemia    Arthritis    Blood transfusion without reported diagnosis    Chronic kidney disease 05/22/2016   urinary retention  has catheter      Edema extremities    Hyperlipidemia    Peripheral vascular disease (HCC)    Pneumonia 11/2013   Hx. pneumonia      Seasonal allergies    Tachycardia     Past Surgical History:  Procedure Laterality Date   ANKLE SURGERY Right    APPENDECTOMY     BACK SURGERY  05/2016   CARPAL TUNNEL RELEASE Left    CHOLECYSTECTOMY     TONSILLECTOMY     TUBAL LIGATION     VEIN LIGATION AND STRIPPING      MEDICATIONS:  Prior to Admission medications   Medication Sig Start Date End Date Taking? Authorizing Provider  albuterol (PROVENTIL) (2.5 MG/3ML) 0.083% nebulizer solution SMARTSIG:3 Milliliter(s) Via Nebulizer Every 6 Hours PRN 04/17/20   [provider]  albuterol (VENTOLIN HFA) 108 (90 Base) MCG/ACT inhaler Inhale 1-2 puffs into the lungs every 4 (four) hours as needed for wheezing or shortness of breath.    [provider]  benzonatate (TESSALON) 100 MG capsule Take by mouth 3 (three) times daily as needed for cough.    [provider]  Cholecalciferol (VITAMIN D3 PO) Take by mouth  daily.    [provider]  cyanocobalamin (V-R VITAMIN B-12) 500 MCG tablet Take 500 mcg by mouth daily.     [provider]  dextromethorphan-guaiFENesin (MUCINEX DM) 30-600 MG 12hr tablet Take 2 tablets by mouth 2 (two) times daily.     [provider]  famotidine (PEPCID) 20 MG tablet Take 1 tablet (20 mg total) by mouth daily. 05/15/20   Lynn Ito, MD  fluticasone (FLONASE) 50 MCG/ACT nasal spray Place 2 sprays into both nostrils daily.     [provider]  fluticasone-salmeterol (ADVAIR HFA) 230-21 MCG/ACT inhaler Inhale 2 puffs into the lungs every 12 (twelve) hours. 04/27/20 04/27/21  [provider]  furosemide (LASIX) 20 MG tablet Take 20 mg by mouth daily.    [provider]  metoprolol (LOPRESSOR) 50 MG tablet Take 50 mg by mouth 2 (two) times daily.  02/26/16   [provider]  potassium chloride SA (KLOR-CON) 20 MEQ tablet Take 20 mEq by mouth daily.    [provider]    Physical Exam   Triage Vital Signs: ED Triage Vitals  Enc Vitals Group     BP 05/18/22 0023 (!) 166/96     Pulse Rate 05/18/22 0023 97  Resp --      Temp --      Temp src --      SpO2 05/18/22 0023 100 %     Weight 05/18/22 0024 250 lb (113.4 kg)     Height 05/18/22 0024 5' (1.524 m)     Head Circumference --      Peak Flow --      Pain Score --      Pain Loc --      Pain Edu? --      Excl. in GC? --     Most recent vital signs: Vitals:   05/18/22 0215 05/18/22 0230  BP:  (!) 138/53  Pulse: 85 86  Resp: 19 17  Temp:    SpO2:      CONSTITUTIONAL: Alert and oriented and responds appropriately to questions.  Obese, elderly, in mild respiratory distress HEAD: Normocephalic, atraumatic EYES: Conjunctivae clear, pupils appear equal, sclera nonicteric ENT: normal nose; moist mucous membranes NECK: Supple, normal ROM CARD: RRR; S1 and S2 appreciated; no murmurs, no clicks, no rubs, no gallops RESP: Patient is tachypneic.   She has inspiratory and expiratory wheezes heard diffusely.  Diminished aeration at her bases bilaterally.  No hypoxia on nonrebreather receiving breathing treatment at this time.  No rhonchi or rales. 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  No calf tenderness or calf swelling. SKIN: Normal color for age and race; warm; no rash on exposed skin NEURO: Moves all extremities equally, normal speech PSYCH: The patient's mood and manner are appropriate.   ED Results / Procedures / Treatments   LABS: (all labs ordered are listed, but only abnormal results are displayed) Labs Reviewed  BLOOD GAS, ARTERIAL - Abnormal; Notable for the following components:      Result Value   pO2, Arterial 156 (*)    Bicarbonate 28.5 (*)    Acid-Base Excess 3.2 (*)    All other components within normal limits  CBC WITH DIFFERENTIAL/PLATELET - Abnormal; Notable for the following components:   RBC 5.12 (*)    Hemoglobin 15.1 (*)    HCT 47.6 (*)    Eosinophils Absolute 0.7 (*)    All other components within normal limits  COMPREHENSIVE METABOLIC PANEL - Abnormal; Notable for the following components:   Glucose, Bld 197 (*)    AST 74 (*)    ALT 60 (*)    All other components within normal limits  SARS CORONAVIRUS 2 BY RT PCR  PROCALCITONIN  BRAIN NATRIURETIC PEPTIDE  TROPONIN I (HIGH SENSITIVITY)  TROPONIN I (HIGH SENSITIVITY)     EKG:    Date: 09/10/202300:26  Rate: 96  Rhythm: normal sinus rhythm  QRS Axis: normal  Intervals: normal  ST/T Wave abnormalities: normal  Conduction Disutrbances: none  Narrative Interpretation: unremarkable    RADIOLOGY: My personal review and interpretation of imaging: Chest x-ray  clear.  I have personally reviewed all radiology reports.   DG Chest Portable 1 View  Result Date: 05/18/2022 CLINICAL DATA:  Shortness of breath. EXAM: PORTABLE CHEST 1 VIEW COMPARISON:  PA Lat 11/21/2021. FINDINGS: Heart size and central vasculature are normal. There is calcific plaque of the aortic arch, slight aortic tortuosity with stable mediastinum. A calcified granuloma is again noted in the left lower lung  field. There are mild chronic interstitial changes of the lungs without evidence of focal pneumonia or pleural collections. Osteopenia and thoracic spondylosis. IMPRESSION: No active disease. Stable chest with chronic change and aortic atherosclerosis. Electronically Signed   By: Almira Bar M.D.   On: 05/18/2022 00:53     PROCEDURES:  Critical Care performed: Yes, see critical care procedure note(s)   CRITICAL CARE Performed by: Baxter Hire Elina Streng   Total critical care time: 45 minutes  Critical care time was exclusive of separately billable procedures and treating other patients.  Critical care was necessary to treat or prevent imminent or life-threatening deterioration.  Critical care was time spent personally by me on the following activities: development of treatment plan with patient and/or surrogate as well as nursing, discussions with consultants, evaluation of patient's response to treatment, examination of patient, obtaining history from patient or surrogate, ordering and performing treatments and interventions, ordering and review of laboratory studies, ordering and review of radiographic studies, pulse oximetry and re-evaluation of patient's condition.   Marland Kitchen1-3 Lead EKG Interpretation  Performed by: Blanchard Willhite, Layla Maw, DO Authorized by: Faatimah Spielberg, Layla Maw, DO     Interpretation: normal     ECG rate:  97   ECG rate assessment: normal     Rhythm: sinus rhythm     Ectopy: none     Conduction: normal       IMPRESSION / MDM / ASSESSMENT AND PLAN / ED COURSE  I reviewed  the triage vital signs and the nursing notes.    Patient here with shortness of breath, wheezing.  Hypoxic on her normal 2 L nasal cannula at home.  The patient is on the cardiac monitor to evaluate for evidence of arrhythmia and/or significant heart rate changes.   DIFFERENTIAL DIAGNOSIS (includes but not limited to):   COPD exacerbation, CHF exacerbation, pneumonia, pneumothorax, COVID, other viral URI, CHF, ACS   Patient's presentation is most consistent with acute presentation with potential threat to life or bodily function.   PLAN: Patient here with shortness of breath, COPD exacerbation.  Will obtain CBC, BMP, troponin, BNP, chest x-ray, ABG, COVID swab.  We will continue breathing treatments and give magnesium.  Patient will need admission.  We will place her on BiPAP.   MEDICATIONS GIVEN IN ED: Medications  albuterol (PROVENTIL) (2.5 MG/3ML) 0.083% nebulizer solution 5 mg (has no administration in time range)  ipratropium (ATROVENT) nebulizer solution 0.5 mg (has no administration in time range)  LORazepam (ATIVAN) injection 0.5 mg (has no administration in time range)  albuterol (PROVENTIL) (2.5 MG/3ML) 0.083% nebulizer solution 5 mg (has no administration in time range)  ipratropium (ATROVENT) nebulizer solution 0.5 mg (has no administration in time range)  albuterol (PROVENTIL) (2.5 MG/3ML) 0.083% nebulizer solution 5 mg (5 mg Nebulization Given 05/18/22 0042)  ipratropium (ATROVENT) nebulizer solution 0.5 mg (0.5 mg Nebulization Given 05/18/22 0042)  magnesium sulfate IVPB 2 g 50 mL (0 g Intravenous Stopped 05/18/22 0236)     ED COURSE: Patient clinically improving on BiPAP and after breathing treatments but still has slight increased work of breathing and wheezing on exam.  We will continue breathing treatments and admit for COPD exacerbation.  Labs show no leukocytosis.  Normal electrolytes and renal function.  Procalcitonin negative.  COVID-negative.  ABG shows elevated  PaO2 and no respiratory acidosis, hypercapnia.  Continuing BiPAP just for work of breathing at this time.  Troponin negative.  BNP normal.  Chest x-ray reviewed and interpreted by myself and the radiologist and shows  no acute abnormality.   CONSULTS:  Consulted and discussed patient's case with hospitalist, Dr. Imogene Burn.  I have recommended admission and consulting physician agrees and will place admission orders.  Patient (and family if present) agree with this plan.   I reviewed all nursing notes, vitals, pertinent previous records.  All labs, EKGs, imaging ordered have been independently reviewed and interpreted by myself.    OUTSIDE RECORDS REVIEWED: Reviewed patient's last office visit with cardiology on 12/09/2021.       FINAL CLINICAL IMPRESSION(S) / ED DIAGNOSES   Final diagnoses:  COPD exacerbation (HCC)  Acute respiratory failure with hypoxia (HCC)     Rx / DC Orders   ED Discharge Orders     None        Note:  This document was prepared using Dragon voice recognition software and may include unintentional dictation errors.   Nikia Levels, Layla Maw, DO 05/18/22 [id number]

## 2022-05-18 NOTE — Hospital Course (Addendum)
Taken from H&P.  71 yo WF with hx of COPD, chronic hypoxic respiratory failure on home oxygen, hypertension, morbid obesity with BMI 48, history of paroxysmal atrial fibrillation on Xarelto presents to the ER today with shortness of breath.  This has been worsening over several weeks.  Finally came to the ER tonight at the urging of her daughter due to continued respiratory distress at home.   Daughter states the patient has been short of breath for several months now.  She refused to go to see her doctors.  She had seen a pulmonologist at the end of 2021 but has since not followed up with any of her appointments.   She has not had any fever or chills.  Daughter states that she coughs up mucous plugs in a daily basis.  She states that when she is having trouble difficulty is usually due to mucous plugging.  She has been using her nebulizer treatments without much success at home.  ED course.  Patient was hypoxic in mid 80s, initially requiring BiPAP which she accepted eventually.  Labs mostly unremarkable.  Procalcitonin negative. Chest x-ray negative for any acute abnormality. Patient was clinically in respiratory distress with decreased air movements and wheezing.  9/10: Patient was seen and examined today.  She was weaned off from BiPAP and saturating well on 5 L.  Using 2 to 3 L of oxygen at baseline. 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  One of the sputum culture prior to this bronchoscopy with 1 colony of Aspergillus.  Patient never received any treatment.  Only was given Advair which she stopped taking after 1 year.  Pulmonary function testing at that time with overlap of asthma and COPD.  Patient stopped smoking a year ago. Continue to have thick sputum production.  9/11: Patient seems improved and at baseline.  No more  significant wheezing.  Able to eat and drink and walk around with walker.  Continue to have some mucus production. She was advised to continue using spirometer and Mucinex to help loosen those secretions and bring them up. Patient has oxygen with concentrator at home and need to continue using 2 to 3 L.  She also has oximeter and advised to keep her saturation above 90%. She was also given 5 days of Zithromax and prednisone. Patient has A1c elevated above 8 and need to see a primary care provider very closely for better control of her diabetes. She will get benefit from seeing a pulmonologist for COPD. She will continue her home inhalers.  I will given a prescription of DuoNeb to be used as needed. Sputum cultures are pending but preliminary looks like normal flora.  She will continue on current medications and need to have a close follow-up with her providers.

## 2022-05-18 NOTE — ED Triage Notes (Signed)
Pt to ED via EMS.  Pt reports recent Hx of fungal lung infection.  Pt reports increase of SOB worsening throughout the day.  Pt fully oriented and verbal at this time.

## 2022-05-18 NOTE — H&P (Addendum)
History and Physical    Marqueisha Kierce MVE:[id number] DOB: 09/12/1950 DOA: 05/18/2022  DOS: the patient was seen and examined on 05/18/2022  PCP: Dione Housekeeper, MD   Patient coming from: Home  I have personally briefly reviewed patient's old medical records in [facility] [place] [facility]  CC: SOB, wheezing HPI: 71 yo WF with hx of COPD, chronic hypoxic respiratory failure on home oxygen, hypertension, morbid obesity with BMI 48, history of paroxysmal atrial fibrillation on Xarelto presents to the ER today with shortness of breath.  This has been worsening over several weeks.  Finally came to the ER tonight at the urging of her daughter due to continued respiratory distress at home.  Daughter states the patient has been short of breath for several months now.  She refused to go to see her doctors.  She had seen a pulmonologist at the end of 2021 but has since not followed up with any of her appointments.  She has not had any fever or chills.  Daughter states that she coughs up mucous plugs in a daily basis.  She states that when she is having trouble difficulty is usually due to mucous plugging.  She has been using her nebulizer treatments without much success at home.  Patient's last treatment with steroids was several months ago.  Patient called EMS today due to worsening breathing.  Per EDP report, patient had O2 saturations of 86% on 2 L.  She was tried on CPAP per EMS but the patient refused.  Here in the ER, ED provider gave the patient a choice of either trying BiPAP or being intubated due to her respiratory distress.  Patient finally agreed to BiPAP.  Chest x-ray negative for pneumonia.  BNP normal at 59  Procalcitonin less than 0.10  White count 7.9, hemoglobin 15.1, platelets of 190  Sodium 139, potassium, glucose of 197, creatinine 0.8  COVID-negative.  [facility] hospitalist contacted for admission.     ED Course: Chest x-ray negative.  Patient still in respiratory  distress.  Given the choice of either BiPAP or intubation.  Patient shows BiPAP.  Labs reassuring.  Review of Systems:  Review of Systems  Constitutional: Negative.  Negative for chills and fever.  HENT: Negative.    Eyes: Negative.   Respiratory:  Positive for cough, sputum production, shortness of breath and wheezing.   Cardiovascular:  Positive for leg swelling. Negative for chest pain and palpitations.  Gastrointestinal: Negative.   Genitourinary: Negative.   Musculoskeletal: Negative.   Skin: Negative.   Neurological: Negative.   Endo/Heme/Allergies: Negative.   Psychiatric/Behavioral: Negative.    All other systems reviewed and are negative.   Past Medical History:  Diagnosis Date   Anemia    Arthritis    Blood transfusion without reported diagnosis    Chronic kidney disease 05/22/2016   urinary retention  has catheter      Edema extremities    Hyperlipidemia    Peripheral vascular disease (HCC)    Pneumonia 11/2013   Hx. pneumonia      Seasonal allergies    Tachycardia     Past Surgical History:  Procedure Laterality Date   ANKLE SURGERY Right    APPENDECTOMY     BACK SURGERY  05/2016   CARPAL TUNNEL RELEASE Left    CHOLECYSTECTOMY     TONSILLECTOMY     TUBAL LIGATION     VEIN LIGATION AND STRIPPING       reports that she has quit smoking. Her smoking use  included cigarettes. She has a 0.25 pack-year smoking history. She has never used smokeless tobacco. She reports current alcohol use. She reports that she does not use drugs.  Allergies  Allergen Reactions   Eggs Or Egg-Derived Products Anaphylaxis    Egg albumin   Fentanyl Anaphylaxis   Morphine And Related     unknown   Spiriva Handihaler [Tiotropium Bromide Monohydrate] Other (See Comments)    bronchiospasms   Sulfa Antibiotics Hives   Penicillins Rash    Has patient had a PCN reaction causing immediate rash, facial/tongue/throat swelling, SOB or lightheadedness with hypotension: YES Has patient  had a PCN reaction causing severe rash involving mucus membranes or skin necrosis: NO Has patient had a PCN reaction that required hospitalization NO Has patient had a PCN reaction occurring within the last 10 years: NO If all of the above answers are "NO", then may proceed with Cephalosporin use.   Voltaren [Diclofenac] Rash    Family History  Problem Relation Age of Onset   Heart disease Mother    Hypertension Mother    Hyperlipidemia Mother    Diabetes Brother     Prior to Admission medications   Medication Sig Start Date End Date Taking? Authorizing Provider  Cholecalciferol 25 MCG (1000 UT) capsule Take 1,000 Units by mouth daily.   Yes [provider]  cyanocobalamin (VITAMIN B12) 500 MCG tablet Take 500 mcg by mouth daily.   Yes [provider]  dextromethorphan-guaiFENesin (MUCINEX DM) 30-600 MG 12hr tablet Take 1 tablet by mouth 2 (two) times daily.   Yes [provider]  famotidine (PEPCID) 20 MG tablet Take 20 mg by mouth daily. 05/15/20  Yes [provider]  fluticasone (FLONASE) 50 MCG/ACT nasal spray Place 2 sprays into both nostrils daily.    Yes [provider]  fluticasone furoate-vilanterol (BREO ELLIPTA) 200-25 MCG/ACT AEPB Inhale 1 puff into the lungs daily. 11/27/21  Yes [provider]  furosemide (LASIX) 20 MG tablet Take 20 mg by mouth daily.   Yes [provider]  ipratropium (ATROVENT) 0.02 % nebulizer solution Take by nebulization 4 (four) times daily. 05/15/22  Yes [provider]  levalbuterol Penne Lash) 0.63 MG/3ML nebulizer solution Take by nebulization every 6 (six) hours as needed. 02/21/22  Yes [provider]  loratadine-pseudoephedrine (CLARITIN-D 24-HOUR) 10-240 MG 24 hr tablet Take 1 tablet by mouth daily.   Yes [provider]  metFORMIN (GLUCOPHAGE-XR) 750 MG 24 hr tablet Take 750 mg by mouth daily. 05/14/22  Yes [provider]  metoprolol (LOPRESSOR) 50 MG  tablet Take 50 mg by mouth 2 (two) times daily.  02/26/16  Yes [provider]  potassium chloride (KLOR-CON) 10 MEQ tablet Take by mouth. 10/24/21  Yes [provider]  rivaroxaban (XARELTO) 20 MG TABS tablet Take 20 mg by mouth daily with supper. 12/09/21  Yes [provider]  roflumilast (DALIRESP) 500 MCG TABS tablet Take 500 mcg by mouth daily. 12/05/21  Yes [provider]    Physical Exam: Vitals:   05/18/22 0150 05/18/22 0200 05/18/22 0215 05/18/22 0230  BP:  (!) 141/63  (!) 138/53  Pulse:  85 85 86  Resp:  17 19 17   Temp:      TempSrc:      SpO2: 99%     Weight:      Height:        Physical Exam Vitals and nursing note reviewed.  Constitutional:      General: She is not in  acute distress.    Appearance: She is obese. She is not toxic-appearing or diaphoretic.     Comments: Wearing BiPAP.  Appears comfortable.  HENT:     Head: Normocephalic and atraumatic.     Nose: Nose normal.  Cardiovascular:     Rate and Rhythm: Normal rate and regular rhythm.  Pulmonary:     Breath sounds: Decreased air movement present. Wheezing present.  Abdominal:     General: Bowel sounds are normal. There is no distension.     Tenderness: There is no abdominal tenderness. There is no guarding or rebound.  Musculoskeletal:     Right lower leg: 1+ Edema present.     Left lower leg: 1+ Edema present.     Comments: Non pitting edema  Skin:    General: Skin is warm and dry.     Capillary Refill: Capillary refill takes less than 2 seconds.  Neurological:     General: No focal deficit present.     Mental Status: She is alert and oriented to person, place, and time.      Labs on Admission: I have personally reviewed following labs and imaging studies  CBC: Recent Labs  Lab 05/18/22 0029  WBC 7.9  NEUTROABS 4.7  HGB 15.1*  HCT 47.6*  MCV 93.0  PLT 190   Basic Metabolic Panel: Recent Labs  Lab 05/18/22 0029  NA 139  K 3.8  CL 102  CO2 28   GLUCOSE 197*  BUN 15  CREATININE 0.80  CALCIUM 9.1   GFR: Estimated Creatinine Clearance: 71.9 mL/min (by C-G formula based on SCr of 0.8 mg/dL). Liver Function Tests: Recent Labs  Lab 05/18/22 0029  AST 74*  ALT 60*  ALKPHOS 110  BILITOT 0.8  PROT 7.4  ALBUMIN 4.2   No results for input(s): "LIPASE", "AMYLASE" in the last 168 hours. No results for input(s): "AMMONIA" in the last 168 hours. Coagulation Profile: No results for input(s): "INR", "PROTIME" in the last 168 hours. Cardiac Enzymes: Recent Labs  Lab 05/18/22 0029  TROPONINIHS 5   BNP (last 3 results) No results for input(s): "PROBNP" in the last 8760 hours. HbA1C: No results for input(s): "HGBA1C" in the last 72 hours. CBG: No results for input(s): "GLUCAP" in the last 168 hours. Lipid Profile: No results for input(s): "CHOL", "HDL", "LDLCALC", "TRIG", "CHOLHDL", "LDLDIRECT" in the last 72 hours. Thyroid Function Tests: No results for input(s): "TSH", "T4TOTAL", "FREET4", "T3FREE", "THYROIDAB" in the last 72 hours. Anemia Panel: No results for input(s): "VITAMINB12", "FOLATE", "FERRITIN", "TIBC", "IRON", "RETICCTPCT" in the last 72 hours. Urine analysis:    Component Value Date/Time   COLORURINE Yellow 09/27/2014 1820   APPEARANCEUR Hazy 09/27/2014 1820   LABSPEC 1.025 09/27/2014 1820   PHURINE 5.0 09/27/2014 1820   GLUCOSEU Negative 09/27/2014 1820   HGBUR 1+ 09/27/2014 1820   BILIRUBINUR 1+ 09/27/2014 1820   KETONESUR Negative 09/27/2014 1820   PROTEINUR 100 mg/dL 45/80/9983 3825   NITRITE Negative 09/27/2014 1820   LEUKOCYTESUR Negative 09/27/2014 1820    Radiological Exams on Admission: I have personally reviewed images DG Chest Portable 1 View  Result Date: 05/18/2022 CLINICAL DATA:  Shortness of breath. EXAM: PORTABLE CHEST 1 VIEW COMPARISON:  PA Lat 11/21/2021. FINDINGS: Heart size and central vasculature are normal. There is calcific plaque of the aortic arch, slight aortic tortuosity with  stable mediastinum. A calcified granuloma is again noted in the left lower lung field. There are mild chronic interstitial changes of the lungs without evidence of  focal pneumonia or pleural collections. Osteopenia and thoracic spondylosis. IMPRESSION: No active disease. Stable chest with chronic change and aortic atherosclerosis. Electronically Signed   By: Almira Bar M.D.   On: 05/18/2022 00:53    EKG: My personal interpretation of EKG shows: no EKG    Assessment/Plan Principal Problem:   COPD with acute exacerbation (HCC) Active Problems:   Acute on chronic respiratory failure with hypoxia (HCC)   Lower extremity edema   Essential hypertension   Obesity, Class III, BMI 40-49.9 (morbid obesity) (HCC)   Paroxysmal atrial fibrillation (HCC)   Chronic respiratory failure with hypoxia, on home oxygen therapy (HCC) - 2 L/min continuously   DM (diabetes mellitus), type 2 (HCC)    Assessment and Plan: * COPD with acute exacerbation (HCC) Observation SDU. Continue with prednisone 40 mg daily. Pt received 125 mg IV solumedrol by EMS. Continue with duonebs q6h. Pt needs referral to pulmonology. dtr states pt coughs up mucus plugs each day. She would benefit from chest physiotherapy. No leukocytosis. No need for abx therapy at this time.  COPD with acute exacerbation (HCC) is a Acute on Chronic illness/condition that poses a threat to life or bodily function.   Acute on chronic respiratory failure with hypoxia (HCC) Continue with Bipap therapy. Will need admission to SDU for bipap.  Check echo to evaluate for pulmonary hypertension  Paroxysmal atrial fibrillation (HCC) On xarelto for CVA prophylaxis. continue lopressor 50 mg bid.  Obesity, Class III, BMI 40-49.9 (morbid obesity) (HCC) BMI 48.8  Essential hypertension Stable. Continue lasix and lopressor.  Lower extremity edema Chronic. BNP normal.  DM (diabetes mellitus), type 2 (HCC) Patient diagnosed with diabetes in her  posthospitalization discharge clinic.  Patient has failed to follow-up with her PCP for treatment.  Check A1c.  Place patient on sliding scale.  Chronic respiratory failure with hypoxia, on home oxygen therapy (HCC) - 2 L/min continuously Acutely worsened. Normally on 2 L/min via [place] at home. Now requiring bipap for respiratory distress.   DVT prophylaxis: Xarelto Code Status: Full Code Family Communication: discussed with pt and dtr catheryn at bedside Disposition Plan: return home  Consults called: none  Admission status: Observation, Step Down Unit   Carollee Herter, DO [facility] 05/18/2022, 3:26 AM

## 2022-05-18 NOTE — Progress Notes (Signed)
FIO2 to .30 

## 2022-05-18 NOTE — Assessment & Plan Note (Addendum)
Estimated body mass index is 48.82 kg/m as calculated from the following:   Height as of this encounter: 5' (1.524 m).   Weight as of this encounter: 113.4 kg.   -This will complicate overall prognosis

## 2022-05-18 NOTE — Assessment & Plan Note (Addendum)
-   Continue home Lopressor and Xarelto.

## 2022-05-18 NOTE — ED Notes (Signed)
Pt took off bi-pap and placed on 5L/min O2.

## 2022-05-18 NOTE — Assessment & Plan Note (Addendum)
Patient with baseline 2 to 3 L of oxygen use, initially requiring BiPAP.  Currently at 5 L of oxygen. Echocardiogram was also ordered.

## 2022-05-18 NOTE — Assessment & Plan Note (Signed)
Acutely worsened. Normally on 2 L/min via  at home. Now requiring bipap for respiratory distress.

## 2022-05-18 NOTE — Assessment & Plan Note (Addendum)
Admitted with concern of COPD exacerbation. Daughter was concerned about Aspergillus but prior bowel cultures were negative.  Continue to have thick sputum production.  Currently weaned off from BiPAP on 5 L with baseline 2 to 3 L of oxygen use. -Continue with Solu-Medrol. -Sputum culture -Add azithromycin for COPD exacerbation -Continue with bronchodilators and supportive care -Wean down to baseline oxygen use as tolerated

## 2022-05-18 NOTE — Assessment & Plan Note (Addendum)
Patient diagnosed with diabetes in her posthospitalization discharge clinic.  Patient has failed to follow-up with her PCP for treatment.  A1c at 8.2 -Continue with SSI -Can start her on metformin on discharge and a close follow-up by PCP

## 2022-05-18 NOTE — Progress Notes (Signed)
Progress Note   Patient: Madison [name] IRS:[id number] DOB: July 15, 1951 DOA: 05/18/2022     0 DOS: the patient was seen and examined on 05/18/2022   Brief hospital course: Taken from H&P.  71 yo WF with hx of COPD, chronic hypoxic respiratory failure on home oxygen, hypertension, morbid obesity with BMI 48, history of paroxysmal atrial fibrillation on Xarelto presents to the ER today with shortness of breath.  This has been worsening over several weeks.  Finally came to the ER tonight at the urging of her daughter due to continued respiratory distress at home.   Daughter states the patient has been short of breath for several months now.  She refused to go to see her doctors.  She had seen a pulmonologist at the end of 2021 but has since not followed up with any of her appointments.   She has not had any fever or chills.  Daughter states that she coughs up mucous plugs in a daily basis.  She states that when she is having trouble difficulty is usually due to mucous plugging.  She has been using her nebulizer treatments without much success at home.  ED course.  Patient was hypoxic in mid 80s, initially requiring BiPAP which she accepted eventually.  Labs mostly unremarkable.  Procalcitonin negative. Chest x-ray negative for any acute abnormality. Patient was clinically in respiratory distress with decreased air movements and wheezing.  9/10: Patient was seen and examined today.  She was weaned off from BiPAP and saturating well on 5 L.  Using 2 to 3 L of oxygen at baseline. 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  One of the sputum culture prior to this bronchoscopy with 1 colony of Aspergillus.  Patient never received any treatment.  Only was given Advair which she stopped taking after 1 year.  Pulmonary function testing at  that time with overlap of asthma and COPD.  Patient stopped smoking a year ago. Continue to have thick sputum production.   Assessment and Plan: * COPD with acute exacerbation [facility]) Admitted with concern of COPD exacerbation. Daughter was concerned about Aspergillus but prior bowel cultures were negative.  Continue to have thick sputum production.  Currently weaned off from BiPAP on 5 L with baseline 2 to 3 L of oxygen use. -Continue with Solu-Medrol. -Sputum culture -Add azithromycin for COPD exacerbation -Continue with bronchodilators and supportive care -Wean down to baseline oxygen use as tolerated   Acute on chronic respiratory failure with hypoxia (HCC) Patient with baseline 2 to 3 L of oxygen use, initially requiring BiPAP.  Currently at 5 L of oxygen. Echocardiogram was also ordered.  Paroxysmal atrial fibrillation (HCC) - Continue home Lopressor and Xarelto.  Essential hypertension Stable. - Continue lasix and lopressor.  DM (diabetes mellitus), type 2 (HCC) Patient diagnosed with diabetes in her posthospitalization discharge clinic.  Patient has failed to follow-up with her PCP for treatment.  A1c at 8.2 -Continue with SSI -Can start her on metformin on discharge and a close follow-up by PCP  Lower extremity edema Chronic. BNP normal.  Obesity, Class III, BMI 40-49.9 (morbid obesity) (HCC) Estimated body mass index is 48.82 kg/m as calculated from the following:   Height as of this encounter: 5' (1.524 m).   Weight as of this encounter: 113.4 kg.   -This will complicate overall prognosis  Chronic respiratory  failure with hypoxia, on home oxygen therapy (HCC) - 2 L/min continuously-resolved as of 05/18/2022 Acutely worsened. Normally on 2 L/min via [place] at home. Now requiring bipap for respiratory distress.   Subjective: Patient was seen and examined today.  Feeling much improved.  He was of the BiPAP and able to speak full sentences.  Physical Exam: Vitals:    05/18/22 0900 05/18/22 0945 05/18/22 1200 05/18/22 1300  BP: (!) 158/70  (!) 148/85 (!) 128/51  Pulse: 88 97 99 97  Resp: 19 (!) 35 (!) 21 20  Temp:      TempSrc:      SpO2:   100% 100%  Weight:      Height:       General.  Obese lady, in no acute distress. Pulmonary.  Scattered wheeze bilaterally, normal respiratory effort. CV.  Regular rate and rhythm, no JVD, rub or murmur. Abdomen.  Soft, nontender, nondistended, BS positive. CNS.  Alert and oriented .  No focal neurologic deficit. Extremities.  No edema, no cyanosis, pulses intact and symmetrical. Psychiatry.  Judgment and insight appears normal.  Data Reviewed: Prior data reviewed  Family Communication: Discussed with daughter at bedside  Disposition: Status is: Observation The patient remains OBS appropriate and will d/c before 2 midnights.  Planned Discharge Destination: Home  DVT prophylaxis.  Xarelto Time spent:  minutes  This record has been created using Conservation officer, historic buildings. Errors have been sought and corrected,but may not always be located. Such creation errors do not reflect on the standard of care.  Author: Arnetha Courser, MD 05/18/2022 2:05 PM  For on call review [email or web address].

## 2022-05-18 NOTE — Assessment & Plan Note (Addendum)
Stable. - Continue lasix and lopressor.

## 2022-05-18 NOTE — ED Notes (Signed)
Pt currently resting comfortably at this time. Bi-pap in place at 30% fiO2. Pt speaking in full sentences and endorses she feels a lot better now.

## 2022-05-18 NOTE — ED Notes (Signed)
Pt placed back on bi-pap due increased WOB. PRN albuterol TX given to help with RR.

## 2022-05-19 LAB — ECHOCARDIOGRAM COMPLETE
AR max vel: 2.54 cm2
AV Area VTI: 2.2 cm2
AV Area mean vel: 2.54 cm2
AV Mean grad: 9 mmHg
AV Peak grad: 13.2 mmHg
Ao pk vel: 1.82 m/s
Calc EF: 89.7 %
Height: 60 in
MV VTI: 3.33 cm2
Single Plane A2C EF: 90.2 %
Single Plane A4C EF: 88.3 %
Weight: 4000 oz

## 2022-05-19 LAB — GLUCOSE, CAPILLARY: Glucose-Capillary: 161 mg/dL — ABNORMAL HIGH (ref 70–99)

## 2022-05-19 MED ORDER — IPRATROPIUM-ALBUTEROL 0.5-2.5 (3) MG/3ML IN SOLN: 3.0000 mL | Freq: Four times a day (QID) | RESPIRATORY_TRACT | 0 refills | Status: AC | PRN

## 2022-05-19 MED ORDER — AZITHROMYCIN 250 MG PO TABS: ORAL_TABLET | ORAL | 0 refills | Status: DC

## 2022-05-19 MED ORDER — DM-GUAIFENESIN ER 30-600 MG PO TB12: 1.0000 | ORAL_TABLET | Freq: Two times a day (BID) | ORAL | 1 refills | Status: AC

## 2022-05-19 MED ORDER — PREDNISONE 20 MG PO TABS: 40.0000 mg | ORAL_TABLET | Freq: Every day | ORAL | 0 refills | Status: AC

## 2022-05-19 NOTE — Care Management Obs Status (Signed)
MEDICARE OBSERVATION STATUS NOTIFICATION   Patient Details  Name: Madgeline Rayo MRN: [id number] Date of Birth: Nov 20, 1950   Medicare Observation Status Notification Given:  Yes    Margarito Liner, LCSW 05/19/2022, 11:18 AM

## 2022-05-19 NOTE — Progress Notes (Signed)
PT Cancellation Note  Patient Details Name: Abbegayle Denault MRN: [id number] DOB: [birth year]   Cancelled Treatment:    Reason Eval/Treat Not Completed: Other (comment). Screened by PT, no needs identified at this time. Documented as walking indep in room, confirmed with RN staff. Please re-order if needs change.   Louna Rothgeb 05/19/2022, 9:57 AM Elizabeth Palau, PT, DPT, GCS [phone]

## 2022-05-19 NOTE — Progress Notes (Signed)
OT Cancellation Note  Patient Details Name: Shavontae Gibeault MRN: [id number] DOB: 09/05/1951   Cancelled Treatment:    Reason Eval/Treat Not Completed: OT screened, no needs identified, will sign off. Consult received, chart reviewed. No OT needs identified at this time. Please re-order if needs change.  Arman Filter., MPH, MS, OTR/L ascom [phone] 05/19/22, 10:25 AM

## 2022-05-19 NOTE — TOC Transition Note (Signed)
Transition of Care [facility]) - CM/SW Discharge Note   Patient Details  Name: Madison [name] MRN: [id number] Date of Birth: Jan 13, 1951  Transition of Care [facility]) CM/SW Contact:  Margarito Liner, LCSW Phone Number: 05/19/2022, 11:46 AM   Clinical Narrative:  Patient has orders to discharge home today. PT/OT determined she does not have any needs. Patient is on 2 L oxygen at home through [facility] but she is currently requiring 4 L. Called and [facility] and they confirmed they just need an updated DME order, not a sats test. CSW obtained and faxed. One of her daughters will pick her up today and she will remind them to bring her home oxygen for the ride. No further concerns. CSW signing off.  Final next level of care: Home/Self Care Barriers to Discharge: No Barriers Identified   Patient Goals and CMS Choice        Discharge Placement                Patient to be transferred to facility by: One of her daughters   Patient and family notified of of transfer: 05/19/22  Discharge Plan and Services                                     Social Determinants of Health (SDOH) Interventions     Readmission Risk Interventions     No data to display

## 2022-05-19 NOTE — Discharge Summary (Signed)
Physician Discharge Summary   Patient: Madison [name] MRN: [id number] DOB: November 27, 1950  Admit date:     05/18/2022  Discharge date: 05/19/22  Discharge Physician: Arnetha CourserSumayya Federick Levene   PCP: Dione Housekeeperlmedo, Mario Ernesto, MD   Recommendations at discharge:  Follow-up with primary care provider within a week Follow-up with pulmonology Please ensure completion of the course of Zithromax and prednisone. Follow-up final sputum culture results.  Discharge Diagnoses: Principal Problem:   COPD with acute exacerbation (HCC) Active Problems:   Acute on chronic respiratory failure with hypoxia (HCC)   Paroxysmal atrial fibrillation (HCC)   Essential hypertension   DM (diabetes mellitus), type 2 (HCC)   Lower extremity edema   Obesity, Class III, BMI 40-49.9 (morbid obesity) (HCC)   COPD with exacerbation (HCC)  Resolved Problems:   Chronic respiratory failure with hypoxia, on home oxygen therapy (HCC) - 2 L/min continuously  Hospital Course: Taken from H&P.  71 yo WF with hx of COPD, chronic hypoxic respiratory failure on home oxygen, hypertension, morbid obesity with BMI 48, history of paroxysmal atrial fibrillation on Xarelto presents to the ER today with shortness of breath.  This has been worsening over several weeks.  Finally came to the ER tonight at the urging of her daughter due to continued respiratory distress at home.   Daughter states the patient has been short of breath for several months now.  She refused to go to see her doctors.  She had seen a pulmonologist at the end of 2021 but has since not followed up with any of her appointments.   She has not had any fever or chills.  Daughter states that she coughs up mucous plugs in a daily basis.  She states that when she is having trouble difficulty is usually due to mucous plugging.  She has been using her nebulizer treatments without much success at home.  ED course.  Patient was hypoxic in mid 80s, initially requiring BiPAP which she  accepted eventually.  Labs mostly unremarkable.  Procalcitonin negative. Chest x-ray negative for any acute abnormality. Patient was clinically in respiratory distress with decreased air movements and wheezing.  9/10: Patient was seen and examined today.  She was weaned off from BiPAP and saturating well on 5 L.  Using 2 to 3 L of oxygen at baseline. 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  One of the sputum culture prior to this bronchoscopy with 1 colony of Aspergillus.  Patient never received any treatment.  Only was given Advair which she stopped taking after 1 year.  Pulmonary function testing at that time with overlap of asthma and COPD.  Patient stopped smoking a year ago. Continue to have thick sputum production.  9/11: Patient seems improved and at baseline.  No more significant wheezing.  Able to eat and drink and walk around with walker.  Continue to have some mucus production. She was advised to continue using spirometer and Mucinex to help loosen those secretions and bring them up. Patient has oxygen with concentrator at home and need to continue using 2 to 3 L.  She also has oximeter and advised to keep her saturation above 90%. She was also given 5 days of Zithromax and prednisone. Patient has A1c elevated above 8 and need to see a primary care provider very closely for better control of her diabetes. She will get  benefit from seeing a pulmonologist for COPD. She will continue her home inhalers.  I will given a prescription of DuoNeb to be used as needed. Sputum cultures are pending but preliminary looks like normal flora.  She will continue on current medications and need to have a close follow-up with her providers.  Assessment and Plan: * COPD with acute exacerbation [facility]) Admitted with concern of COPD  exacerbation. Daughter was concerned about Aspergillus but prior bowel cultures were negative.  Continue to have thick sputum production.  Currently weaned off from BiPAP on 5 L with baseline 2 to 3 L of oxygen use. -Continue with Solu-Medrol. -Sputum culture -Add azithromycin for COPD exacerbation -Continue with bronchodilators and supportive care -Wean down to baseline oxygen use as tolerated   Acute on chronic respiratory failure with hypoxia (HCC) Patient with baseline 2 to 3 L of oxygen use, initially requiring BiPAP.  Currently at 5 L of oxygen. Echocardiogram was also ordered.  Paroxysmal atrial fibrillation (HCC) - Continue home Lopressor and Xarelto.  Essential hypertension Stable. - Continue lasix and lopressor.  DM (diabetes mellitus), type 2 (HCC) Patient diagnosed with diabetes in her posthospitalization discharge clinic.  Patient has failed to follow-up with her PCP for treatment.  A1c at 8.2 -Continue with SSI -Can start her on metformin on discharge and a close follow-up by PCP  Lower extremity edema Chronic. BNP normal.  Obesity, Class III, BMI 40-49.9 (morbid obesity) (HCC) Estimated body mass index is 48.82 kg/m as calculated from the following:   Height as of this encounter: 5' (1.524 m).   Weight as of this encounter: 113.4 kg.   -This will complicate overall prognosis  Chronic respiratory failure with hypoxia, on home oxygen therapy (HCC) - 2 L/min continuously-resolved as of 05/18/2022 Acutely worsened. Normally on 2 L/min via [place] at home. Now requiring bipap for respiratory distress.   Consultants: None Procedures performed: None Disposition: Home Diet recommendation:  Discharge Diet Orders (From admission, onward)     Start     Ordered   05/19/22 0000  Diet - low sodium heart healthy        05/19/22 1020           Cardiac and Carb modified diet DISCHARGE MEDICATION: Allergies as of 05/19/2022       Reactions   Eggs Or Egg-derived  Products Anaphylaxis   Egg albumin   Fentanyl Anaphylaxis   Morphine And Related    unknown   Spiriva Handihaler [tiotropium Bromide Monohydrate] Other (See Comments)   bronchiospasms   Sulfa Antibiotics Hives   Penicillins Rash   Has patient had a PCN reaction causing immediate rash, facial/tongue/throat swelling, SOB or lightheadedness with hypotension: YES Has patient had a PCN reaction causing severe rash involving mucus membranes or skin necrosis: NO Has patient had a PCN reaction that required hospitalization NO Has patient had a PCN reaction occurring within the last 10 years: NO If all of the above answers are "NO", then may proceed with Cephalosporin use.   Voltaren [diclofenac] Rash        Medication List     STOP taking these medications    ipratropium 0.02 % nebulizer solution Commonly known as: ATROVENT       TAKE these medications    azithromycin 250 MG tablet Commonly known as: ZITHROMAX Take 1 tablet daily for 4 days Start taking on: May 20, 2022   Cholecalciferol 25 MCG (1000 UT) capsule Take 1,000 Units by mouth daily.   cyanocobalamin 500 MCG  tablet Commonly known as: VITAMIN B12 Take 500 mcg by mouth daily.   dextromethorphan-guaiFENesin 30-600 MG 12hr tablet Commonly known as: MUCINEX DM Take 1 tablet by mouth 2 (two) times daily.   famotidine 20 MG tablet Commonly known as: PEPCID Take 20 mg by mouth daily.   fluticasone 50 MCG/ACT nasal spray Commonly known as: FLONASE Place 2 sprays into both nostrils daily.   fluticasone furoate-vilanterol 200-25 MCG/ACT Aepb Commonly known as: BREO ELLIPTA Inhale 1 puff into the lungs daily.   furosemide 20 MG tablet Commonly known as: LASIX Take 20 mg by mouth daily.   ipratropium-albuterol 0.5-2.5 (3) MG/3ML Soln Commonly known as: DUONEB Take 3 mLs by nebulization every 6 (six) hours as needed (Shortness of breath and wheezing).   levalbuterol 0.63 MG/3ML nebulizer  solution Commonly known as: XOPENEX Take by nebulization every 6 (six) hours as needed.   loratadine-pseudoephedrine 10-240 MG 24 hr tablet Commonly known as: CLARITIN-D 24-hour Take 1 tablet by mouth daily.   metFORMIN 750 MG 24 hr tablet Commonly known as: GLUCOPHAGE-XR Take 750 mg by mouth daily.   metoprolol tartrate 50 MG tablet Commonly known as: LOPRESSOR Take 50 mg by mouth 2 (two) times daily.   potassium chloride 10 MEQ tablet Commonly known as: KLOR-CON Take by mouth.   predniSONE 20 MG tablet Commonly known as: DELTASONE Take 2 tablets (40 mg total) by mouth daily with breakfast for 5 days. Start taking on: May 20, 2022   rivaroxaban 20 MG Tabs tablet Commonly known as: XARELTO Take 20 mg by mouth daily with supper.   roflumilast 500 MCG Tabs tablet Commonly known as: DALIRESP Take 500 mcg by mouth daily.        Follow-up Information     Zada Finders Joycie Peek, MD. Schedule an appointment as soon as possible for a visit in 1 week(s).   Specialty: Family Medicine Contact information: [address] [place] [address] [phone]         Vida Rigger, MD. Schedule an appointment as soon as possible for a visit in 1 week(s).   Specialty: Pulmonary Disease Contact information: [address] [place] [address] [phone]                Discharge Exam: Ceasar Mons Weights   05/18/22 0024 05/18/22 1707 05/19/22 0453  Weight: 113.4 kg 112.3 kg 104.1 kg   General.  Morbidly obese lady, in no acute distress. Pulmonary.  Lungs clear bilaterally, normal respiratory effort. CV.  Regular rate and rhythm, no JVD, rub or murmur. Abdomen.  Soft, nontender, nondistended, BS positive. CNS.  Alert and oriented .  No focal neurologic deficit. Extremities.  No edema, no cyanosis, pulses intact and symmetrical. Psychiatry.  Judgment and insight appears normal.   Condition at discharge: stable  The results of significant  diagnostics from this hospitalization (including imaging, microbiology, ancillary and laboratory) are listed below for reference.   Imaging Studies: DG Chest Portable 1 View  Result Date: 05/18/2022 CLINICAL DATA:  Shortness of breath. EXAM: PORTABLE CHEST 1 VIEW COMPARISON:  PA Lat 11/21/2021. FINDINGS: Heart size and central vasculature are normal. There is calcific plaque of the aortic arch, slight aortic tortuosity with stable mediastinum. A calcified granuloma is again noted in the left lower lung field. There are mild chronic interstitial changes of the lungs without evidence of focal pneumonia or pleural collections. Osteopenia and thoracic spondylosis. IMPRESSION: No active disease. Stable chest with chronic change and aortic atherosclerosis. Electronically Signed   By: Mellody Dance  Chesser M.D.   On: 05/18/2022 00:53    Microbiology: Results for orders placed or performed during the hospital encounter of 05/18/22  SARS Coronavirus 2 by RT PCR (hospital order, performed in [facility] hospital lab) *cepheid single result test* Anterior Nasal Swab     Status: None   Collection Time: 05/18/22 12:29 AM   Specimen: Anterior Nasal Swab  Result Value Ref Range Status   SARS Coronavirus 2 by RT PCR NEGATIVE NEGATIVE Final    Comment: (NOTE) SARS-CoV-2 target nucleic acids are NOT DETECTED.  The SARS-CoV-2 RNA is generally detectable in upper and lower respiratory specimens during the acute phase of infection. The lowest concentration of SARS-CoV-2 viral copies this assay can detect is 250 copies / mL. A negative result does not preclude SARS-CoV-2 infection and should not be used as the sole basis for treatment or other patient management decisions.  A negative result may occur with improper specimen collection / handling, submission of specimen other than nasopharyngeal swab, presence of viral mutation(s) within the areas targeted by this assay, and inadequate number of viral copies (<250  copies / mL). A negative result must be combined with clinical observations, patient history, and epidemiological information.  Fact Sheet for Patients:   RoadLapTop.co.za  Fact Sheet for Healthcare Providers: http://kim-miller.com/  This test is not yet approved or  cleared by the [place] FDA and has been authorized for detection and/or diagnosis of SARS-CoV-2 by FDA under an Emergency Use Authorization (EUA).  This EUA will remain in effect (meaning this test can be used) for the duration of the COVID-19 declaration under Section 564(b)(1) of the Act, 21 U.S.C. section 360bbb-3(b)(1), unless the authorization is terminated or revoked sooner.  Performed at [facility], [address] [place], [place] [address]   Expectorated Sputum Assessment w Gram Stain, Rflx to Resp Cult     Status: None   Collection Time: 05/18/22  3:58 PM   Specimen: Sputum  Result Value Ref Range Status   Specimen Description SPUTUM  Final   Special Requests NONE  Final   Sputum evaluation   Final    THIS SPECIMEN IS ACCEPTABLE FOR SPUTUM CULTURE Performed at [facility], [address], [place], [place] [address]    Report Status 05/18/2022 FINAL  Final  Culture, Respiratory w Gram Stain     Status: None (Preliminary result)   Collection Time: 05/18/22  3:58 PM   Specimen: SPU  Result Value Ref Range Status   Specimen Description   Final    SPUTUM Performed at [facility], [address], [place], [place] [address]    Special Requests   Final    NONE Reflexed from [id number] Performed at [facility], [address] [place], [place] [address]    Gram Stain   Final    FEW SQUAMOUS EPITHELIAL CELLS PRESENT RARE WBC PRESENT, PREDOMINANTLY PMN FEW GRAM POSITIVE COCCI IN PAIRS RARE GRAM POSITIVE COCCI IN CLUSTERS Performed at [facility], [address], [place], [place] [address]    Culture PENDING   Incomplete   Report Status PENDING  Incomplete    Labs: CBC: Recent Labs  Lab 05/18/22 0029  WBC 7.9  NEUTROABS 4.7  HGB 15.1*  HCT 47.6*  MCV 93.0  PLT 190   Basic Metabolic Panel: Recent Labs  Lab 05/18/22 0029  NA 139  K 3.8  CL 102  CO2 28  GLUCOSE 197*  BUN 15  CREATININE 0.80  CALCIUM  9.1   Liver Function Tests: Recent Labs  Lab 05/18/22 0029  AST 74*  ALT 60*  ALKPHOS 110  BILITOT 0.8  PROT 7.4  ALBUMIN 4.2   CBG: Recent Labs  Lab 05/18/22 1717 05/18/22 2114 05/19/22 0824  GLUCAP 280* 97 161*    Discharge time spent: greater than 30 minutes.  This record has been created using Conservation officer, historic buildings. Errors have been sought and corrected,but may not always be located. Such creation errors do not reflect on the standard of care.   Signed: Arnetha Courser, MD [facility] 05/19/2022

## 2022-05-20 LAB — HIV ANTIBODY (ROUTINE TESTING W REFLEX): HIV Screen 4th Generation wRfx: NONREACTIVE

## 2022-05-21 LAB — CULTURE, RESPIRATORY W GRAM STAIN

## 2022-12-30 ENCOUNTER — Encounter: Payer: Self-pay | Admitting: Internal Medicine

## 2022-12-30 ENCOUNTER — Other Ambulatory Visit: Payer: Self-pay

## 2022-12-30 ENCOUNTER — Other Ambulatory Visit (HOSPITAL_COMMUNITY): Payer: Self-pay

## 2022-12-30 ENCOUNTER — Emergency Department: Payer: Medicare Other

## 2022-12-30 ENCOUNTER — Inpatient Hospital Stay
Admission: EM | Admit: 2022-12-30 | Discharge: 2023-01-01 | DRG: 189 | Disposition: A | Discharge status: Home / Self Care | Payer: Medicare Other | Attending: Student in an Organized Health Care Education/Training Program | Admitting: Student in an Organized Health Care Education/Training Program

## 2022-12-30 DIAGNOSIS — J9621 Acute and chronic respiratory failure with hypoxia: Secondary | ICD-10-CM | POA: Diagnosis present

## 2022-12-30 DIAGNOSIS — Z83438 Family history of other disorder of lipoprotein metabolism and other lipidemia: Secondary | ICD-10-CM | POA: Diagnosis not present

## 2022-12-30 DIAGNOSIS — T380X5A Adverse effect of glucocorticoids and synthetic analogues, initial encounter: Secondary | ICD-10-CM | POA: Diagnosis present

## 2022-12-30 DIAGNOSIS — Z880 Allergy status to penicillin: Secondary | ICD-10-CM | POA: Diagnosis not present

## 2022-12-30 DIAGNOSIS — Z882 Allergy status to sulfonamides status: Secondary | ICD-10-CM

## 2022-12-30 DIAGNOSIS — Z7984 Long term (current) use of oral hypoglycemic drugs: Secondary | ICD-10-CM

## 2022-12-30 DIAGNOSIS — E1122 Type 2 diabetes mellitus with diabetic chronic kidney disease: Secondary | ICD-10-CM | POA: Diagnosis present

## 2022-12-30 DIAGNOSIS — B370 Candidal stomatitis: Secondary | ICD-10-CM | POA: Diagnosis present

## 2022-12-30 DIAGNOSIS — Z7901 Long term (current) use of anticoagulants: Secondary | ICD-10-CM

## 2022-12-30 DIAGNOSIS — I480 Paroxysmal atrial fibrillation: Secondary | ICD-10-CM | POA: Diagnosis present

## 2022-12-30 DIAGNOSIS — Z79899 Other long term (current) drug therapy: Secondary | ICD-10-CM | POA: Diagnosis not present

## 2022-12-30 DIAGNOSIS — Z8249 Family history of ischemic heart disease and other diseases of the circulatory system: Secondary | ICD-10-CM | POA: Diagnosis not present

## 2022-12-30 DIAGNOSIS — E66813 Obesity, class 3: Secondary | ICD-10-CM | POA: Diagnosis present

## 2022-12-30 DIAGNOSIS — E1151 Type 2 diabetes mellitus with diabetic peripheral angiopathy without gangrene: Secondary | ICD-10-CM | POA: Diagnosis present

## 2022-12-30 DIAGNOSIS — Z886 Allergy status to analgesic agent status: Secondary | ICD-10-CM

## 2022-12-30 DIAGNOSIS — I10 Essential (primary) hypertension: Secondary | ICD-10-CM | POA: Diagnosis present

## 2022-12-30 DIAGNOSIS — E785 Hyperlipidemia, unspecified: Secondary | ICD-10-CM | POA: Diagnosis present

## 2022-12-30 DIAGNOSIS — Z87891 Personal history of nicotine dependence: Secondary | ICD-10-CM

## 2022-12-30 DIAGNOSIS — I5032 Chronic diastolic (congestive) heart failure: Secondary | ICD-10-CM | POA: Diagnosis present

## 2022-12-30 DIAGNOSIS — Z91012 Allergy to eggs: Secondary | ICD-10-CM

## 2022-12-30 DIAGNOSIS — I110 Hypertensive heart disease with heart failure: Secondary | ICD-10-CM | POA: Diagnosis present

## 2022-12-30 DIAGNOSIS — K219 Gastro-esophageal reflux disease without esophagitis: Secondary | ICD-10-CM | POA: Diagnosis present

## 2022-12-30 DIAGNOSIS — Z1152 Encounter for screening for COVID-19: Secondary | ICD-10-CM | POA: Diagnosis not present

## 2022-12-30 DIAGNOSIS — J441 Chronic obstructive pulmonary disease with (acute) exacerbation: Secondary | ICD-10-CM | POA: Diagnosis present

## 2022-12-30 DIAGNOSIS — R0689 Other abnormalities of breathing: Secondary | ICD-10-CM

## 2022-12-30 DIAGNOSIS — Z6841 Body Mass Index (BMI) 40.0 and over, adult: Secondary | ICD-10-CM | POA: Diagnosis not present

## 2022-12-30 DIAGNOSIS — E119 Type 2 diabetes mellitus without complications: Secondary | ICD-10-CM

## 2022-12-30 DIAGNOSIS — I5043 Acute on chronic combined systolic (congestive) and diastolic (congestive) heart failure: Secondary | ICD-10-CM | POA: Diagnosis present

## 2022-12-30 DIAGNOSIS — Z885 Allergy status to narcotic agent status: Secondary | ICD-10-CM | POA: Diagnosis not present

## 2022-12-30 DIAGNOSIS — I5023 Acute on chronic systolic (congestive) heart failure: Secondary | ICD-10-CM

## 2022-12-30 DIAGNOSIS — J9601 Acute respiratory failure with hypoxia: Principal | ICD-10-CM

## 2022-12-30 DIAGNOSIS — Z833 Family history of diabetes mellitus: Secondary | ICD-10-CM | POA: Diagnosis not present

## 2022-12-30 DIAGNOSIS — I5033 Acute on chronic diastolic (congestive) heart failure: Secondary | ICD-10-CM | POA: Diagnosis present

## 2022-12-30 DIAGNOSIS — R0602 Shortness of breath: Secondary | ICD-10-CM | POA: Diagnosis present

## 2022-12-30 DIAGNOSIS — R0603 Acute respiratory distress: Secondary | ICD-10-CM

## 2022-12-30 LAB — LACTIC ACID, PLASMA
Lactic Acid, Venous: 0.8 mmol/L (ref 0.5–1.9)
Lactic Acid, Venous: 1 mmol/L (ref 0.5–1.9)

## 2022-12-30 LAB — COMPREHENSIVE METABOLIC PANEL
ALT: 43 U/L (ref 0–44)
AST: 35 U/L (ref 15–41)
Albumin: 4.2 g/dL (ref 3.5–5.0)
Alkaline Phosphatase: 88 U/L (ref 38–126)
Anion gap: 8 (ref 5–15)
BUN: 15 mg/dL (ref 8–23)
CO2: 30 mmol/L (ref 22–32)
Calcium: 8.8 mg/dL — ABNORMAL LOW (ref 8.9–10.3)
Chloride: 97 mmol/L — ABNORMAL LOW (ref 98–111)
Creatinine, Ser: 0.75 mg/dL (ref 0.44–1.00)
GFR, Estimated: >60 mL/min (ref >60)
Glucose, Bld: 220 mg/dL — ABNORMAL HIGH (ref 70–99)
Potassium: 4.4 mmol/L (ref 3.5–5.1)
Sodium: 135 mmol/L (ref 135–145)
Total Bilirubin: 0.9 mg/dL (ref 0.3–1.2)
Total Protein: 7.6 g/dL (ref 6.5–8.1)

## 2022-12-30 LAB — BLOOD GAS, ARTERIAL
Acid-Base Excess: 3.9 mmol/L — ABNORMAL HIGH (ref 0.0–2.0)
Bicarbonate: 33.1 mmol/L — ABNORMAL HIGH (ref 20.0–28.0)
Delivery systems: POSITIVE
Expiratory PAP: 8 cmH2O
FIO2: 40 %
Inspiratory PAP: 16 cmH2O
O2 Saturation: 99.6 %
Patient temperature: 37
pCO2 arterial: 72 mmHg (ref 32–48)
pH, Arterial: 7.27 — ABNORMAL LOW (ref 7.35–7.45)
pO2, Arterial: 131 mmHg — ABNORMAL HIGH (ref 83–108)

## 2022-12-30 LAB — MAGNESIUM: Magnesium: 2.6 mg/dL — ABNORMAL HIGH (ref 1.7–2.4)

## 2022-12-30 LAB — CBC WITH DIFFERENTIAL/PLATELET
Abs Immature Granulocytes: 0.05 10*3/uL (ref 0.00–0.07)
Basophils Absolute: 0.1 10*3/uL (ref 0.0–0.1)
Basophils Relative: 1 %
Eosinophils Absolute: 1 10*3/uL — ABNORMAL HIGH (ref 0.0–0.5)
Eosinophils Relative: 11 %
HCT: 47.4 % — ABNORMAL HIGH (ref 36.0–46.0)
Hemoglobin: 14.9 g/dL (ref 12.0–15.0)
Immature Granulocytes: 1 %
Lymphocytes Relative: 23 %
Lymphs Abs: 2.1 10*3/uL (ref 0.7–4.0)
MCH: 29.9 pg (ref 26.0–34.0)
MCHC: 31.4 g/dL (ref 30.0–36.0)
MCV: 95.2 fL (ref 80.0–100.0)
Monocytes Absolute: 0.5 10*3/uL (ref 0.1–1.0)
Monocytes Relative: 6 %
Neutro Abs: 5.3 10*3/uL (ref 1.7–7.7)
Neutrophils Relative %: 58 %
Platelets: 228 10*3/uL (ref 150–400)
RBC: 4.98 MIL/uL (ref 3.87–5.11)
RDW: 12.6 % (ref 11.5–15.5)
WBC: 9.1 10*3/uL (ref 4.0–10.5)
nRBC: 0 % (ref 0.0–0.2)

## 2022-12-30 LAB — CBG MONITORING, ED
Glucose-Capillary: 194 mg/dL — ABNORMAL HIGH (ref 70–99)
Glucose-Capillary: 247 mg/dL — ABNORMAL HIGH (ref 70–99)
Glucose-Capillary: 204 mg/dL — ABNORMAL HIGH (ref 70–99)

## 2022-12-30 LAB — TROPONIN I (HIGH SENSITIVITY): Troponin I (High Sensitivity): 5 ng/L (ref <18)

## 2022-12-30 LAB — GLUCOSE, CAPILLARY: Glucose-Capillary: 250 mg/dL — ABNORMAL HIGH (ref 70–99)

## 2022-12-30 LAB — SARS CORONAVIRUS 2 BY RT PCR: SARS Coronavirus 2 by RT PCR: NEGATIVE

## 2022-12-30 LAB — BRAIN NATRIURETIC PEPTIDE: B Natriuretic Peptide: 97.9 pg/mL (ref 0.0–100.0)

## 2022-12-30 MED ORDER — VITAMIN B-12 1000 MCG PO TABS
500.0000 ug | ORAL_TABLET | Freq: Every day | ORAL | Status: DC
  Administered 2022-12-30 – 2023-01-01 (×3): 500 ug via ORAL
  Filled 2022-12-30 (×3): qty 1

## 2022-12-30 MED ORDER — MAGNESIUM SULFATE 2 GM/50ML IV SOLN
2.0000 g | Freq: Once | INTRAVENOUS | Status: AC
  Administered 2022-12-30: 2 g via INTRAVENOUS
  Filled 2022-12-30: qty 50

## 2022-12-30 MED ORDER — METHYLPREDNISOLONE SODIUM SUCC 40 MG IJ SOLR
40.0000 mg | Freq: Two times a day (BID) | INTRAMUSCULAR | Status: DC
  Administered 2022-12-30 – 2023-01-01 (×4): 40 mg via INTRAVENOUS
  Filled 2022-12-30 (×4): qty 1

## 2022-12-30 MED ORDER — LORATADINE-PSEUDOEPHEDRINE ER 10-240 MG PO TB24: 1.0000 | ORAL_TABLET | Freq: Every day | ORAL | Status: DC

## 2022-12-30 MED ORDER — MONTELUKAST SODIUM 10 MG PO TABS
10.0000 mg | ORAL_TABLET | Freq: Every day | ORAL | Status: DC
  Administered 2022-12-30 – 2022-12-31 (×2): 10 mg via ORAL
  Filled 2022-12-30 (×2): qty 1

## 2022-12-30 MED ORDER — ACETAMINOPHEN 325 MG PO TABS
650.0000 mg | ORAL_TABLET | Freq: Four times a day (QID) | ORAL | Status: DC | PRN
  Administered 2022-12-31 – 2023-01-01 (×4): 650 mg via ORAL
  Filled 2022-12-30 (×5): qty 2

## 2022-12-30 MED ORDER — AZITHROMYCIN 500 MG PO TABS
500.0000 mg | ORAL_TABLET | Freq: Every day | ORAL | Status: AC
  Administered 2022-12-30: 500 mg via ORAL
  Filled 2022-12-30: qty 1

## 2022-12-30 MED ORDER — RIVAROXABAN 20 MG PO TABS
20.0000 mg | ORAL_TABLET | Freq: Every day | ORAL | Status: DC
  Administered 2022-12-30 – 2022-12-31 (×2): 20 mg via ORAL
  Filled 2022-12-30 (×2): qty 1

## 2022-12-30 MED ORDER — AZITHROMYCIN 250 MG PO TABS
250.0000 mg | ORAL_TABLET | Freq: Every day | ORAL | Status: DC
  Administered 2022-12-31 – 2023-01-01 (×2): 250 mg via ORAL
  Filled 2022-12-30 (×2): qty 1

## 2022-12-30 MED ORDER — LORATADINE 10 MG PO TABS
10.0000 mg | ORAL_TABLET | Freq: Every day | ORAL | Status: DC
  Administered 2022-12-30 – 2023-01-01 (×3): 10 mg via ORAL
  Filled 2022-12-30 (×3): qty 1

## 2022-12-30 MED ORDER — FUROSEMIDE 10 MG/ML IJ SOLN
40.0000 mg | Freq: Two times a day (BID) | INTRAMUSCULAR | Status: DC
  Administered 2022-12-30 – 2022-12-31 (×4): 40 mg via INTRAVENOUS
  Filled 2022-12-30 (×4): qty 4

## 2022-12-30 MED ORDER — METOPROLOL TARTRATE 50 MG PO TABS
50.0000 mg | ORAL_TABLET | Freq: Two times a day (BID) | ORAL | Status: DC
  Administered 2022-12-30 – 2023-01-01 (×5): 50 mg via ORAL
  Filled 2022-12-30 (×5): qty 1

## 2022-12-30 MED ORDER — ATORVASTATIN CALCIUM 20 MG PO TABS
20.0000 mg | ORAL_TABLET | Freq: Every day | ORAL | Status: DC
  Administered 2022-12-30 – 2022-12-31 (×2): 20 mg via ORAL
  Filled 2022-12-30 (×2): qty 1

## 2022-12-30 MED ORDER — VITAMIN D 25 MCG (1000 UNIT) PO TABS
1000.0000 [IU] | ORAL_TABLET | Freq: Every day | ORAL | Status: DC
  Administered 2022-12-30 – 2023-01-01 (×3): 1000 [IU] via ORAL
  Filled 2022-12-30 (×3): qty 1

## 2022-12-30 MED ORDER — DM-GUAIFENESIN ER 30-600 MG PO TB12
1.0000 | ORAL_TABLET | Freq: Two times a day (BID) | ORAL | Status: DC | PRN
  Administered 2022-12-30 – 2023-01-01 (×5): 1 via ORAL
  Filled 2022-12-30 (×8): qty 1

## 2022-12-30 MED ORDER — IPRATROPIUM-ALBUTEROL 0.5-2.5 (3) MG/3ML IN SOLN
3.0000 mL | RESPIRATORY_TRACT | Status: DC
  Administered 2022-12-30 – 2022-12-31 (×6): 3 mL via RESPIRATORY_TRACT
  Filled 2022-12-30 (×6): qty 3

## 2022-12-30 MED ORDER — INSULIN ASPART 100 UNIT/ML IJ SOLN
0.0000 [IU] | Freq: Every day | INTRAMUSCULAR | Status: DC
  Administered 2022-12-30: 2 [IU] via SUBCUTANEOUS
  Administered 2022-12-31: 4 [IU] via SUBCUTANEOUS
  Filled 2022-12-30 (×2): qty 1

## 2022-12-30 MED ORDER — INSULIN ASPART 100 UNIT/ML IJ SOLN
0.0000 [IU] | Freq: Three times a day (TID) | INTRAMUSCULAR | Status: DC
  Administered 2022-12-30 (×2): 3 [IU] via SUBCUTANEOUS
  Administered 2022-12-30: 2 [IU] via SUBCUTANEOUS
  Administered 2022-12-31: 3 [IU] via SUBCUTANEOUS
  Administered 2022-12-31: 7 [IU] via SUBCUTANEOUS
  Administered 2022-12-31: 2 [IU] via SUBCUTANEOUS
  Administered 2023-01-01: 11 [IU] via SUBCUTANEOUS
  Administered 2023-01-01: 5 [IU] via SUBCUTANEOUS
  Filled 2022-12-30 (×8): qty 1

## 2022-12-30 MED ORDER — ALBUTEROL SULFATE (2.5 MG/3ML) 0.083% IN NEBU
2.5000 mg | INHALATION_SOLUTION | RESPIRATORY_TRACT | Status: DC | PRN
  Administered 2022-12-30 – 2022-12-31 (×2): 2.5 mg via RESPIRATORY_TRACT
  Filled 2022-12-30 (×3): qty 3

## 2022-12-30 MED ORDER — HYDRALAZINE HCL 20 MG/ML IJ SOLN: 5.0000 mg | INTRAMUSCULAR | Status: DC | PRN

## 2022-12-30 MED ORDER — FAMOTIDINE 20 MG PO TABS
20.0000 mg | ORAL_TABLET | Freq: Every day | ORAL | Status: DC
  Administered 2022-12-30 – 2023-01-01 (×3): 20 mg via ORAL
  Filled 2022-12-30 (×3): qty 1

## 2022-12-30 MED ORDER — ONDANSETRON HCL 4 MG/2ML IJ SOLN: 4.0000 mg | Freq: Three times a day (TID) | INTRAMUSCULAR | Status: DC | PRN

## 2022-12-30 NOTE — ED Triage Notes (Signed)
Patient arrived via EMS for respiratory distress that began tonight. States she has been short of breath x 1 week. 2 duoneb, 1 albuterol and 125 mg solumedrol IV given. 18 g to right forearm. Patient arrived alert, anxious on CPAP with continued labored respirations with accessory muscle use.

## 2022-12-30 NOTE — ED Notes (Addendum)
RT in with pt now  pt now removed frm bipap by rt   pt placed on 3 liters oxygen.  Pt alert.

## 2022-12-30 NOTE — ED Notes (Signed)
Pt eating lunch tray  pt alert

## 2022-12-30 NOTE — Progress Notes (Signed)
Pt taken off bipap and placed on 3lpm [place], sats 98%, respiratory rate 20/min, tolerating well at this time.

## 2022-12-30 NOTE — Consult Note (Signed)
ANTICOAGULATION CONSULT NOTE - Initial Consult  Pharmacy Consult for Xarelto Indication: atrial fibrillation  Allergies  Allergen Reactions   Egg-Derived Products Anaphylaxis    Egg albumin   Fentanyl Anaphylaxis   Morphine And Related     unknown   Spiriva Handihaler [Tiotropium Bromide Monohydrate] Other (See Comments)    bronchiospasms   Sulfa Antibiotics Hives   Penicillins Rash    Has patient had a PCN reaction causing immediate rash, facial/tongue/throat swelling, SOB or lightheadedness with hypotension: YES Has patient had a PCN reaction causing severe rash involving mucus membranes or skin necrosis: NO Has patient had a PCN reaction that required hospitalization NO Has patient had a PCN reaction occurring within the last 10 years: NO If all of the above answers are "NO", then may proceed with Cephalosporin use.   Voltaren [Diclofenac] Rash    Patient Measurements: Height: 5' (152.4 cm) Weight: 108.8 kg (239 lb 13.8 oz) IBW/kg (Calculated) : 45.5   Vital Signs: Temp: 97.7 F (36.5 C) (04/23 0636) Temp Source: Axillary (04/23 0636) BP: 157/72 (04/23 0730) Pulse Rate: 74 (04/23 0730)  Labs: Recent Labs    12/30/22 0529  HGB 14.9  HCT 47.4*  PLT 228  CREATININE 0.75  TROPONINIHS 5    Estimated Creatinine Clearance: 70 mL/min (by C-G formula based on SCr of 0.75 mg/dL).   Medical History: Past Medical History:  Diagnosis Date   Anemia    Arthritis    Blood transfusion without reported diagnosis    Chronic kidney disease 05/22/2016   urinary retention  has catheter      Edema extremities    Hyperlipidemia    Peripheral vascular disease (HCC)    Pneumonia 11/2013   Hx. pneumonia      Seasonal allergies    Tachycardia     Medications:  MD reports that patient was previously on Xarelto but stopped due to cost issues.  Patient requesting to resume at this time.  Assessment: 72 yo female presented to the ED with complaint of respiratory distress.   Patient being admitted for treatment of acute on chronic respiratory failure secondary to COPD and CHF exacerbation.  Patient has history of paroxsymal Afib not currently on anticoagulation for the past seven months per MD.  Pharmacy consulted to start rivaroxaban.  Goal of Therapy:  Monitor platelets by anticoagulation protocol: Yes   Plan:  Start rivaroxaban 20 mg po daily CBC at least every 72 hours  Barrie Folk, PharmD 12/30/2022,7:53 AM

## 2022-12-30 NOTE — ED Notes (Signed)
Patient given warm blanket and repositioned in bed. No new needs at this time.

## 2022-12-30 NOTE — TOC Benefit Eligibility Note (Signed)
Transition of Care [facility]) Benefit Eligibility Note    Patient Details  Name: Madison [name] MRN: [id number] Date of Birth: 30-Sep-1950  Attempted to process medication benefit check for Xarelto  daily.  No pharmacy benefit linked in chart.  Checked patient's general benefits in [employer].  Per [employer], patient has active Medicare A only, Medicare Part B and Pharmacy benefits are inactive.  Unable to determine cost to patient at this time.    Johnell Comings Phone Number: 12/30/2022, 1:46 PM

## 2022-12-30 NOTE — Progress Notes (Signed)
Called for a respiratory distress patient coming in from EMS on CPAP. Placed on our bipap 16/8 @ 40% and obtained ABG.

## 2022-12-30 NOTE — ED Notes (Signed)
Patient placed on 4L [place] at this time.

## 2022-12-30 NOTE — ED Notes (Signed)
Patient resting comfortably on bi-pap. NAD noted. Respirations even and unlabored.

## 2022-12-30 NOTE — H&P (Signed)
History and Physical    Madison [name] FAO:[id number] DOB: [birth year] DOA: 12/30/2022  Referring MD/NP/PA:   PCP: Dione Housekeeper, MD   Patient coming from:  The patient is coming from home.    Chief Complaint: SOB  HPI: Madison [name] is a 72 y.o. female with medical history significant of COPD on prn O2 (up to 4L of O2 at night per pt), dCHF,  HTN, HLD,  DM, A fib on Xarelto, morbid obesity, who presents with shortness of breath.  Patient states that she has shortness of breath for more than a week, which has been significantly worsening since last night.  Patient has wheezing and productive cough with yellow-colored sputum production.  Denies chest pain, fever or chills. Patient is normally using as needed oxygen mainly at home, and was found to have severe respiratory distress, came in with CPAP by EMS, but still has severe respiratory distress on CPAP, cannot speak in full sentence, using accessory muscle for breathing, BiPAP started in ED. Patient does not have nausea vomiting, diarrhea or abdominal pain.  No symptoms of UTI.  Patient has severe bilateral leg edema.  Denies dark stool rectal bleeding.  No recent fall or injury.  Data reviewed independently and ED Course: pt was found to have WBC 9.1, BP 97.9,  negative PCR for COVID, GFR> 60, temperature normal, blood pressure 201/107, 156/81, heart rate 85, RR 30.  VBG with a pH of 7.27, CO2 72, O2 131 while on BiPAP.  Chest x-ray showed COPD without infiltration.  Patient is admitted to PCU as inpatient.  EKG: I have personally reviewed.  Sinus rhythm, QTc 474, low voltage, high T wave.   Review of Systems:   General: no fevers, chills, no body weight gain, has fatigue HEENT: no blurry vision, hearing changes or sore throat Respiratory: has dyspnea, coughing, wheezing CV: no chest pain, no palpitations GI: no nausea, vomiting, abdominal pain, diarrhea, constipation GU: no dysuria, burning on urination, increased  urinary frequency, hematuria  Ext: has leg edema Neuro: no unilateral weakness, numbness, or tingling, no vision change or hearing loss Skin: no rash, no skin tear. MSK: No muscle spasm, no deformity, no limitation of range of movement in spin Heme: No easy bruising.  Travel history: No recent long distant travel.   Allergy:  Allergies  Allergen Reactions   Egg-Derived Products Anaphylaxis    Egg albumin   Fentanyl Anaphylaxis   Morphine And Related     unknown   Spiriva Handihaler [Tiotropium Bromide Monohydrate] Other (See Comments)    bronchiospasms   Sulfa Antibiotics Hives   Penicillins Rash    Has patient had a PCN reaction causing immediate rash, facial/tongue/throat swelling, SOB or lightheadedness with hypotension: YES Has patient had a PCN reaction causing severe rash involving mucus membranes or skin necrosis: NO Has patient had a PCN reaction that required hospitalization NO Has patient had a PCN reaction occurring within the last 10 years: NO If all of the above answers are "NO", then may proceed with Cephalosporin use.   Voltaren [Diclofenac] Rash    Past Medical History:  Diagnosis Date   Anemia    Arthritis    Blood transfusion without reported diagnosis    Chronic kidney disease 05/22/2016   urinary retention  has catheter      Edema extremities    Hyperlipidemia    Peripheral vascular disease (HCC)    Pneumonia 11/2013   Hx. pneumonia      Seasonal allergies  Tachycardia     Past Surgical History:  Procedure Laterality Date   ANKLE SURGERY Right    APPENDECTOMY     BACK SURGERY  05/2016   CARPAL TUNNEL RELEASE Left    CHOLECYSTECTOMY     TONSILLECTOMY     TUBAL LIGATION     VEIN LIGATION AND STRIPPING      Social History:  reports that she quit smoking about 2 years ago. Her smoking use included cigarettes. She has a 0.25 pack-year smoking history. She has never used smokeless tobacco. She reports current alcohol use. She reports that she  does not use drugs.  Family History:  Family History  Problem Relation Age of Onset   Heart disease Mother    Hypertension Mother    Hyperlipidemia Mother    Diabetes Brother      Prior to Admission medications   Medication Sig Start Date End Date Taking? Authorizing Provider  azithromycin (ZITHROMAX) 250 MG tablet Take 1 tablet daily for 4 days 05/20/22   Arnetha Courser, MD  Cholecalciferol 25 MCG (1000 UT) capsule Take 1,000 Units by mouth daily.    [provider]  cyanocobalamin (VITAMIN B12) 500 MCG tablet Take 500 mcg by mouth daily.    [provider]  dextromethorphan-guaiFENesin (MUCINEX DM) 30-600 MG 12hr tablet Take 1 tablet by mouth 2 (two) times daily. 05/19/22   Arnetha Courser, MD  famotidine (PEPCID) 20 MG tablet Take 20 mg by mouth daily. 05/15/20   [provider]  fluticasone (FLONASE) 50 MCG/ACT nasal spray Place 2 sprays into both nostrils daily.     [provider]  fluticasone furoate-vilanterol (BREO ELLIPTA) 200-25 MCG/ACT AEPB Inhale 1 puff into the lungs daily. 11/27/21   [provider]  furosemide (LASIX) 20 MG tablet Take 20 mg by mouth daily.    [provider]  ipratropium-albuterol (DUONEB) 0.5-2.5 (3) MG/3ML SOLN Take 3 mLs by nebulization every 6 (six) hours as needed (Shortness of breath and wheezing). 05/19/22   Arnetha Courser, MD  levalbuterol Pauline Aus) 0.63 MG/3ML nebulizer solution Take by nebulization every 6 (six) hours as needed. 02/21/22   [provider]  loratadine-pseudoephedrine (CLARITIN-D 24-HOUR) 10-240 MG 24 hr tablet Take 1 tablet by mouth daily.    [provider]  metFORMIN (GLUCOPHAGE-XR) 750 MG 24 hr tablet Take 750 mg by mouth daily. 05/14/22   [provider]  metoprolol (LOPRESSOR) 50 MG tablet Take 50 mg by mouth 2 (two) times daily.  02/26/16   [provider]  potassium chloride (KLOR-CON) 10 MEQ tablet Take by mouth. 10/24/21   [provider]   rivaroxaban (XARELTO) 20 MG TABS tablet Take 20 mg by mouth daily with supper. 12/09/21   [provider]  roflumilast (DALIRESP) 500 MCG TABS tablet Take 500 mcg by mouth daily. 12/05/21   [provider]    Physical Exam: Vitals:   12/30/22 0600 12/30/22 0636 12/30/22 0730 12/30/22 0830  BP: (!) 156/81  (!) 157/72 136/76  Pulse: 78  74 72  Resp: (!) 25  (!) 23 (!) 21  Temp:  97.7 F (36.5 C)    TempSrc:  Axillary    SpO2: 99%  98% 96%  Weight:      Height:       General: Has severe respiratory distress HEENT:       Eyes: PERRL, EOMI, no scleral icterus.       ENT: No discharge from the ears and nose, no pharynx injection, no tonsillar enlargement.  Neck: Difficult to assess JVD due to morbid obesity, no bruit, no mass felt. Heme: No neck lymph node enlargement. Cardiac: S1/S2, RRR, No murmurs, No gallops or rubs. Respiratory: Has decreased air movement, wheezing, rales, rhonchi bilaterally GI: Soft, nondistended, nontender, no rebound pain, no organomegaly, BS present. GU: No hematuria Ext: 3+ pitting leg edema bilaterally. 1+DP/PT pulse bilaterally. Musculoskeletal: No joint deformities, No joint redness or warmth, no limitation of ROM in spin. Skin: No rashes.  Neuro: Alert, oriented X3, cranial nerves II-XII grossly intact, moves all extremities normally.  Pneumonia  Psych: Patient is not psychotic, no suicidal or hemocidal ideation.  Labs on Admission: I have personally reviewed following labs and imaging studies  CBC: Recent Labs  Lab 12/30/22 0529  WBC 9.1  NEUTROABS 5.3  HGB 14.9  HCT 47.4*  MCV 95.2  PLT 228   Basic Metabolic Panel: Recent Labs  Lab 12/30/22 0529  NA 135  K 4.4  CL 97*  CO2 30  GLUCOSE 220*  BUN 15  CREATININE 0.75  CALCIUM 8.8*  MG 2.6*   GFR: Estimated Creatinine Clearance: 70 mL/min (by C-G formula based on SCr of 0.75 mg/dL). Liver Function Tests: Recent Labs  Lab 12/30/22 0529  AST 35  ALT 43   ALKPHOS 88  BILITOT 0.9  PROT 7.6  ALBUMIN 4.2   No results for input(s): "LIPASE", "AMYLASE" in the last 168 hours. No results for input(s): "AMMONIA" in the last 168 hours. Coagulation Profile: No results for input(s): "INR", "PROTIME" in the last 168 hours. Cardiac Enzymes: No results for input(s): "CKTOTAL", "CKMB", "CKMBINDEX", "TROPONINI" in the last 168 hours. BNP (last 3 results) No results for input(s): "PROBNP" in the last 8760 hours. HbA1C: No results for input(s): "HGBA1C" in the last 72 hours. CBG: Recent Labs  Lab 12/30/22 0732  GLUCAP 194*   Lipid Profile: No results for input(s): "CHOL", "HDL", "LDLCALC", "TRIG", "CHOLHDL", "LDLDIRECT" in the last 72 hours. Thyroid Function Tests: No results for input(s): "TSH", "T4TOTAL", "FREET4", "T3FREE", "THYROIDAB" in the last 72 hours. Anemia Panel: No results for input(s): "VITAMINB12", "FOLATE", "FERRITIN", "TIBC", "IRON", "RETICCTPCT" in the last 72 hours. Urine analysis:    Component Value Date/Time   COLORURINE Yellow 09/27/2014 1820   APPEARANCEUR Hazy 09/27/2014 1820   LABSPEC 1.025 09/27/2014 1820   PHURINE 5.0 09/27/2014 1820   GLUCOSEU Negative 09/27/2014 1820   HGBUR 1+ 09/27/2014 1820   BILIRUBINUR 1+ 09/27/2014 1820   KETONESUR Negative 09/27/2014 1820   PROTEINUR 100 mg/dL 09/81/1914 7829   NITRITE Negative 09/27/2014 1820   LEUKOCYTESUR Negative 09/27/2014 1820   Sepsis Labs: (procalcitonin:4,lacticidven:4) ) Recent Results (from the past 240 hour(s))  SARS Coronavirus 2 by RT PCR (hospital order, performed in [facility] hospital lab) *cepheid single result test* Anterior Nasal Swab     Status: None   Collection Time: 12/30/22  5:29 AM   Specimen: Anterior Nasal Swab  Result Value Ref Range Status   SARS Coronavirus 2 by RT PCR NEGATIVE NEGATIVE Final    Comment: (NOTE) SARS-CoV-2 target nucleic acids are NOT DETECTED.  The SARS-CoV-2 RNA is generally detectable in upper and  lower respiratory specimens during the acute phase of infection. The lowest concentration of SARS-CoV-2 viral copies this assay can detect is 250 copies / mL. A negative result does not preclude SARS-CoV-2 infection and should not be used as the sole basis for treatment or other patient management decisions.  A negative result may occur with improper specimen collection / handling, submission of  specimen other than nasopharyngeal swab, presence of viral mutation(s) within the areas targeted by this assay, and inadequate number of viral copies (<250 copies / mL). A negative result must be combined with clinical observations, patient history, and epidemiological information.  Fact Sheet for Patients:   RoadLapTop.co.za  Fact Sheet for Healthcare Providers: http://kim-miller.com/  This test is not yet approved or  cleared by the [place] FDA and has been authorized for detection and/or diagnosis of SARS-CoV-2 by FDA under an Emergency Use Authorization (EUA).  This EUA will remain in effect (meaning this test can be used) for the duration of the COVID-19 declaration under Section 564(b)(1) of the Act, 21 U.S.C. section 360bbb-3(b)(1), unless the authorization is terminated or revoked sooner.  Performed at [facility], [address] [place], [place] [address]   Culture, blood (routine x 2)     Status: None (Preliminary result)   Collection Time: 12/30/22  5:30 AM   Specimen: BLOOD  Result Value Ref Range Status   Specimen Description BLOOD LEFT ANTECUBITAL  Final   Special Requests   Final    BOTTLES DRAWN AEROBIC AND ANAEROBIC Blood Culture adequate volume   Culture   Final    NO GROWTH <12 HOURS Performed at [facility], [address], [place], [place] [address]    Report Status PENDING  Incomplete  Culture, blood (routine x 2)     Status: None (Preliminary result)   Collection Time: 12/30/22  5:31 AM    Specimen: BLOOD  Result Value Ref Range Status   Specimen Description BLOOD RIGHT HAND  Final   Special Requests   Final    BOTTLES DRAWN AEROBIC AND ANAEROBIC Blood Culture adequate volume   Culture   Final    NO GROWTH <12 HOURS Performed at [facility], [address] [place], [place] [address]    Report Status PENDING  Incomplete     Radiological Exams on Admission: DG Chest Port 1 View  Result Date: 12/30/2022 CLINICAL DATA:  Shortness of breath EXAM: PORTABLE CHEST 1 VIEW COMPARISON:  05/18/2022 FINDINGS: Hyperinflation with interstitial coarsening. There is no edema, consolidation, effusion, or pneumothorax. Normal heart size and mediastinal contours. Artifact from EKG leads. IMPRESSION: COPD without acute superimposed finding. Electronically Signed   By: Tiburcio Pea M.D.   On: 12/30/2022 05:55      Assessment/Plan Principal Problem:   Acute on chronic respiratory failure with hypoxia Active Problems:   COPD exacerbation   Acute on chronic diastolic CHF (congestive heart failure)   Paroxysmal atrial fibrillation   Essential hypertension   GERD (gastroesophageal reflux disease)   Diabetes mellitus without complication   HLD (hyperlipidemia)   Obesity, Class III, BMI 40-49.9 (morbid obesity)   Assessment and Plan:  Acute on chronic respiratory failure with hypoxia due to combination of COPD and CHF exacerbation: Patient has SOB, productive cough, wheezing, rhonchi on auscultation, consistent with COPD exacerbation.  Patient has 3+ bilateral leg edema, difficult to assess JVD due to morbid obesity, but patient has crackles on auscultation, clinically consistent with fluid overload and CHF exacerbation.  Her normal BNP 97.9 is likely falsely low due to obesity.  -Admitted to PCU as inpatient -BiPAP, try to wean off BiPAP -Bronchodilators, antibiotic and Solu-Medrol for COPD -Lasix for CHF exacerbation -Nasal cannula oxygen to maintain oxygen saturation  above 93% when patient is off BiPAP  COPD exacerbation -Bronchodilators -Patient received 2 g of magnesium sulfate in ED -Solu-Medrol 40 mg IV bid -Z pak  -  Mucinex for cough  -Incentive spirometry -sputum culture  Acute on chronic diastolic CHF (congestive heart failure): Today: 05/26/2022 showed EF> 75%.  -Lasix 40 mg bid by IV -Daily weights -strict I/O's -Low salt diet -Fluid restriction -As needed bronchodilators for shortness of breath  Paroxysmal atrial fibrillation: HR 80s. Pt used to be on Xarelto, but stopped taking it 7 month ago due to unable to afford it. She wants to be restarted Xarelto if we can help to [employer]. -will start Xarelto with pharm consult -will consult TOC  -Metoprolol  Essential hypertension -IV hydralazine as needed -Metoprolol -Patient is on IV Lasix  GERD (gastroesophageal reflux disease) -Protonix  Diabetes mellitus without complication: Recent A1c 8.2, poorly controlled.  Patient is taking metformin at home -Sliding scale insulin  HLD: -Lipitor  Obesity, Class III, BMI 40-49.9 (morbid obesity): Body weight 108.8 kg, BMI 46.84 -Encourage to lose some weight, -Healthy diet and exercise     DVT ppx: on Xarelto    Code Status: Full code  Family Communication: Yes, patient's daughter by phone  Disposition Plan:  Anticipate discharge back to previous environment  Consults called:  none  Admission status and Level of care: Progressive:   as inpt       Dispo: The patient is from: Home              Anticipated d/c is to: Home              Anticipated d/c date is: 2 days              Patient currently is not medically stable to d/c.    Severity of Illness:  The appropriate patient status for this patient is INPATIENT. Inpatient status is judged to be reasonable and necessary in order to provide the required intensity of service to ensure the patient's safety. The patient's presenting symptoms, physical exam findings, and  initial radiographic and laboratory data in the context of their chronic comorbidities is felt to place them at high risk for further clinical deterioration. Furthermore, it is not anticipated that the patient will be medically stable for discharge from the hospital within 2 midnights of admission.   * I certify that at the point of admission it is my clinical judgment that the patient will require inpatient hospital care spanning beyond 2 midnights from the point of admission due to high intensity of service, high risk for further deterioration and high frequency of surveillance required.*       Date of Service 12/30/2022    Lorretta Harp [facility]   If 7PM-7AM, please contact night-coverage [email or web address] 12/30/2022, 10:03 AM

## 2022-12-30 NOTE — ED Notes (Signed)
Fsbs 204

## 2022-12-30 NOTE — ED Notes (Signed)
Patient feeling SOB on the 4L [place]- placed back on bi-pap at this time.

## 2022-12-30 NOTE — ED Provider Notes (Signed)
[facility] Provider Note    Event Date/Time   First MD Initiated Contact with Patient 12/30/22 [phone]     (approximate)   History   Respiratory distress   HPI  Level V caveat: Limited by distress  Madison [name] is a 72 y.o. female brought to the ED via EMS from home with a chief complaint of respiratory distress.  Patient with a history of COPD with as needed oxygen at home who has had a 1 week history of generalized malaise, cough, congestion worsened approximately 15 minutes prior to calling EMS..  EMS reports room air saturation 86%.  Patient endorses chest tightness.  Received 2 DuoNeb, 1 albuterol nebulizer, 125 mg IV Solu-Medrol and arrives to the treatment room on CPAP.  Rest of history is limited secondary to patient's distress.     Past Medical History   Past Medical History:  Diagnosis Date   Anemia    Arthritis    Blood transfusion without reported diagnosis    Chronic kidney disease 05/22/2016   urinary retention  has catheter      Edema extremities    Hyperlipidemia    Peripheral vascular disease (HCC)    Pneumonia 11/2013   Hx. pneumonia      Seasonal allergies    Tachycardia      Active Problem List   Patient Active Problem List   Diagnosis Date Noted   COPD exacerbation 12/30/2022   COPD with acute exacerbation 05/18/2022   Acute on chronic respiratory failure with hypoxia 05/18/2022   DM (diabetes mellitus), type 2 05/18/2022   COPD with exacerbation 05/18/2022   Paroxysmal atrial fibrillation 11/26/2021   Lower extremity edema 04/22/2020   Essential hypertension 04/22/2020   Spondylolisthesis of lumbar region 05/26/2016   Facet arthritis of cervical region 03/05/2015   DDD (degenerative disc disease), lumbar 03/05/2015   Obesity, Class III, BMI 40-49.9 (morbid obesity) 01/06/2014     Past Surgical History   Past Surgical History:  Procedure Laterality Date   ANKLE SURGERY Right    APPENDECTOMY     BACK  SURGERY  05/2016   CARPAL TUNNEL RELEASE Left    CHOLECYSTECTOMY     TONSILLECTOMY     TUBAL LIGATION     VEIN LIGATION AND STRIPPING       Home Medications   Prior to Admission medications   Medication Sig Start Date End Date Taking? Authorizing Provider  azithromycin (ZITHROMAX) 250 MG tablet Take 1 tablet daily for 4 days 05/20/22   Arnetha Courser, MD  Cholecalciferol 25 MCG (1000 UT) capsule Take 1,000 Units by mouth daily.    [provider]  cyanocobalamin (VITAMIN B12) 500 MCG tablet Take 500 mcg by mouth daily.    [provider]  dextromethorphan-guaiFENesin (MUCINEX DM) 30-600 MG 12hr tablet Take 1 tablet by mouth 2 (two) times daily. 05/19/22   Arnetha Courser, MD  famotidine (PEPCID) 20 MG tablet Take 20 mg by mouth daily. 05/15/20   [provider]  fluticasone (FLONASE) 50 MCG/ACT nasal spray Place 2 sprays into both nostrils daily.     [provider]  fluticasone furoate-vilanterol (BREO ELLIPTA) 200-25 MCG/ACT AEPB Inhale 1 puff into the lungs daily. 11/27/21   [provider]  furosemide (LASIX) 20 MG tablet Take 20 mg by mouth daily.    [provider]  ipratropium-albuterol (DUONEB) 0.5-2.5 (3) MG/3ML SOLN Take 3 mLs by nebulization every 6 (six) hours as needed (Shortness of breath and wheezing). 05/19/22  Arnetha Courser, MD  levalbuterol Pauline Aus) 0.63 MG/3ML nebulizer solution Take by nebulization every 6 (six) hours as needed. 02/21/22   [provider]  loratadine-pseudoephedrine (CLARITIN-D 24-HOUR) 10-240 MG 24 hr tablet Take 1 tablet by mouth daily.    [provider]  metFORMIN (GLUCOPHAGE-XR) 750 MG 24 hr tablet Take 750 mg by mouth daily. 05/14/22   [provider]  metoprolol (LOPRESSOR) 50 MG tablet Take 50 mg by mouth 2 (two) times daily.  02/26/16   [provider]  potassium chloride (KLOR-CON) 10 MEQ tablet Take by mouth. 10/24/21   [provider]  rivaroxaban  (XARELTO) 20 MG TABS tablet Take 20 mg by mouth daily with supper. 12/09/21   [provider]  roflumilast (DALIRESP) 500 MCG TABS tablet Take 500 mcg by mouth daily. 12/05/21   [provider]     Allergies  Egg-derived products, Fentanyl, Morphine and related, Spiriva handihaler [tiotropium bromide monohydrate], Sulfa antibiotics, Penicillins, and Voltaren [diclofenac]   Family History   Family History  Problem Relation Age of Onset   Heart disease Mother    Hypertension Mother    Hyperlipidemia Mother    Diabetes Brother      Physical Exam  Triage Vital Signs: ED Triage Vitals  Enc Vitals Group     BP      Pulse      Resp      Temp      Temp src      SpO2      Weight      Height      Head Circumference      Peak Flow      Pain Score      Pain Loc      Pain Edu?      Excl. in GC?     Updated Vital Signs: BP (!) 156/81   Pulse 78   Temp 97.7 F (36.5 C) (Axillary)   Resp (!) 25   Ht 5' (1.524 m)   Wt 108.8 kg   SpO2 99%   BMI 46.84 kg/m    General: Awake, moderate distress.  CV:  RRR. Good peripheral perfusion.  Resp:  Increased effort.  Diminished aeration, wheezing, rales, rhonchi. Abd:  Obese, nontender.  No distention.  Other:  2+ pitting BLE edema with warmth and erythema bilaterally.   ED Results / Procedures / Treatments  Labs (all labs ordered are listed, but only abnormal results are displayed) Labs Reviewed  CBC WITH DIFFERENTIAL/PLATELET - Abnormal; Notable for the following components:      Result Value   HCT 47.4 (*)    Eosinophils Absolute 1.0 (*)    All other components within normal limits  BLOOD GAS, ARTERIAL - Abnormal; Notable for the following components:   pH, Arterial 7.27 (*)    pCO2 arterial 72 (*)    pO2, Arterial 131 (*)    Bicarbonate 33.1 (*)    Acid-Base Excess 3.9 (*)    All other components within normal limits  SARS CORONAVIRUS 2 BY RT PCR  CULTURE, BLOOD (ROUTINE X 2)  CULTURE, BLOOD  (ROUTINE X 2)  COMPREHENSIVE METABOLIC PANEL  BRAIN NATRIURETIC PEPTIDE  MAGNESIUM  LACTIC ACID, PLASMA  LACTIC ACID, PLASMA  TROPONIN I (HIGH SENSITIVITY)     EKG  ED ECG REPORT I, Matheson Vandehei J, the attending physician, personally viewed and interpreted this ECG.   Date: 12/30/2022  EKG Time: 0530  Rate: 86  Rhythm: normal sinus rhythm  Axis: Normal  Intervals:none  ST&T Change: Nonspecific    RADIOLOGY I have independently visualized and interpreted patient's chest x-ray as well as noted the radiology interpretation:  Chest x-ray: COPD  Official radiology report(s): DG Chest Port 1 View  Result Date: 12/30/2022 CLINICAL DATA:  Shortness of breath EXAM: PORTABLE CHEST 1 VIEW COMPARISON:  05/18/2022 FINDINGS: Hyperinflation with interstitial coarsening. There is no edema, consolidation, effusion, or pneumothorax. Normal heart size and mediastinal contours. Artifact from EKG leads. IMPRESSION: COPD without acute superimposed finding. Electronically Signed   By: Tiburcio Pea M.D.   On: 12/30/2022 05:55     PROCEDURES:  Critical Care performed: Yes, see critical care procedure note(s)  CRITICAL CARE Performed by: Irean Hong   Total critical care time: 45 minutes  Critical care time was exclusive of separately billable procedures and treating other patients.  Critical care was necessary to treat or prevent imminent or life-threatening deterioration.  Critical care was time spent personally by me on the following activities: development of treatment plan with patient and/or surrogate as well as nursing, discussions with consultants, evaluation of patient's response to treatment, examination of patient, obtaining history from patient or surrogate, ordering and performing treatments and interventions, ordering and review of laboratory studies, ordering and review of radiographic studies, pulse oximetry and re-evaluation of patient's condition.   Marland Kitchen1-3 Lead EKG  Interpretation  Performed by: Irean Hong, MD Authorized by: Irean Hong, MD     Interpretation: normal     ECG rate:  78   ECG rate assessment: normal     Rhythm: sinus rhythm     Ectopy: none     Conduction: normal   Comments:     Placed on cardiac monitor to evaluate for arrhythmias    MEDICATIONS ORDERED IN ED: Medications  magnesium sulfate IVPB 2 g 50 mL (0 g Intravenous Stopped 12/30/22 0626)     IMPRESSION / MDM / ASSESSMENT AND PLAN / ED COURSE  I reviewed the triage vital signs and the nursing notes.                             72 year old female presenting in respiratory distress with hypoxia. Differential includes, but is not limited to, viral syndrome, bronchitis including COPD exacerbation, pneumonia, reactive airway disease including asthma, CHF including exacerbation with or without pulmonary/interstitial edema, pneumothorax, ACS, thoracic trauma, and pulmonary embolism.  I personally reviewed patient's records and note a PCP office visit for COPD exacerbation on 08/27/2022.  Patient's presentation is most consistent with acute presentation with potential threat to life or bodily function.  The patient is on the cardiac monitor to evaluate for evidence of arrhythmia and/or significant heart rate changes.  Patient placed on BiPAP immediately upon her arrival to the treatment room.  Will obtain sepsis protocol lab work, chest x-ray, COVID swab.  Add 2 g IV magnesium.  Anticipate hospitalization.  Clinical Course as of 12/30/22 0639  Tue Dec 30, 2022  0540 ABG CO2 = 72 [JS]  0605 Patient tolerating BiPAP mask well.  Breathing appears less labored.  Chest x-ray demonstrates COPD without superimposed fluid or infiltrates.  Will consult hospital services for evaluation and admission. [JS]    Clinical Course User Index [JS] Irean Hong, MD     FINAL CLINICAL IMPRESSION(S) / ED DIAGNOSES   Final diagnoses:  Acute respiratory failure with hypoxia  Respiratory  distress  COPD exacerbation  Hypercapnia     Rx / DC  Orders   ED Discharge Orders     None        Note:  This document was prepared using Dragon voice recognition software and may include unintentional dictation errors.   Irean Hong, MD 12/30/22 [phone]

## 2022-12-31 ENCOUNTER — Other Ambulatory Visit (HOSPITAL_COMMUNITY): Payer: Self-pay

## 2022-12-31 DIAGNOSIS — R0603 Acute respiratory distress: Secondary | ICD-10-CM

## 2022-12-31 DIAGNOSIS — J9601 Acute respiratory failure with hypoxia: Secondary | ICD-10-CM

## 2022-12-31 DIAGNOSIS — R0689 Other abnormalities of breathing: Secondary | ICD-10-CM

## 2022-12-31 LAB — BASIC METABOLIC PANEL
Anion gap: 8 (ref 5–15)
BUN: 19 mg/dL (ref 8–23)
CO2: 31 mmol/L (ref 22–32)
Calcium: 9.1 mg/dL (ref 8.9–10.3)
Chloride: 96 mmol/L — ABNORMAL LOW (ref 98–111)
Creatinine, Ser: 0.85 mg/dL (ref 0.44–1.00)
GFR, Estimated: >60 mL/min (ref >60)
Glucose, Bld: 206 mg/dL — ABNORMAL HIGH (ref 70–99)
Potassium: 4 mmol/L (ref 3.5–5.1)
Sodium: 135 mmol/L (ref 135–145)

## 2022-12-31 LAB — CBC
HCT: 44.1 % (ref 36.0–46.0)
Hemoglobin: 14.3 g/dL (ref 12.0–15.0)
MCH: 30.2 pg (ref 26.0–34.0)
MCHC: 32.4 g/dL (ref 30.0–36.0)
MCV: 93.2 fL (ref 80.0–100.0)
Platelets: 215 10*3/uL (ref 150–400)
RBC: 4.73 MIL/uL (ref 3.87–5.11)
RDW: 12.4 % (ref 11.5–15.5)
WBC: 11.4 10*3/uL — ABNORMAL HIGH (ref 4.0–10.5)
nRBC: 0 % (ref 0.0–0.2)

## 2022-12-31 LAB — GLUCOSE, CAPILLARY
Glucose-Capillary: 238 mg/dL — ABNORMAL HIGH (ref 70–99)
Glucose-Capillary: 313 mg/dL — ABNORMAL HIGH (ref 70–99)
Glucose-Capillary: 176 mg/dL — ABNORMAL HIGH (ref 70–99)
Glucose-Capillary: 326 mg/dL — ABNORMAL HIGH (ref 70–99)

## 2022-12-31 LAB — EXPECTORATED SPUTUM ASSESSMENT W GRAM STAIN, RFLX TO RESP C

## 2022-12-31 LAB — MAGNESIUM: Magnesium: 2.5 mg/dL — ABNORMAL HIGH (ref 1.7–2.4)

## 2022-12-31 MED ORDER — IPRATROPIUM-ALBUTEROL 0.5-2.5 (3) MG/3ML IN SOLN
3.0000 mL | Freq: Four times a day (QID) | RESPIRATORY_TRACT | Status: DC
  Administered 2022-12-31 – 2023-01-01 (×5): 3 mL via RESPIRATORY_TRACT
  Filled 2022-12-31 (×5): qty 3

## 2022-12-31 MED ORDER — APIXABAN 5 MG PO TABS
5.0000 mg | ORAL_TABLET | Freq: Two times a day (BID) | ORAL | Status: DC
  Administered 2023-01-01: 5 mg via ORAL
  Filled 2022-12-31: qty 1

## 2022-12-31 MED ORDER — NYSTATIN 100000 UNIT/GM EX POWD: CUTANEOUS | Status: DC | PRN

## 2022-12-31 MED ORDER — NYSTATIN 100000 UNIT/GM EX CREA
TOPICAL_CREAM | CUTANEOUS | Status: DC | PRN
  Filled 2022-12-31: qty 30

## 2022-12-31 MED ORDER — IPRATROPIUM-ALBUTEROL 0.5-2.5 (3) MG/3ML IN SOLN
3.0000 mL | Freq: Once | RESPIRATORY_TRACT | Status: AC
  Administered 2022-12-31: 3 mL via RESPIRATORY_TRACT
  Filled 2022-12-31: qty 3

## 2022-12-31 NOTE — Inpatient Diabetes Management (Signed)
Inpatient Diabetes Program Recommendations  AACE/ADA: New Consensus Statement on Inpatient Glycemic Control   Target Ranges:  Prepandial:   less than 140 mg/dL      Peak postprandial:   less than 180 mg/dL (1-2 hours)      Critically ill patients:  140 - 180 mg/dL    Latest Reference Range & Units 12/30/22 07:32 12/30/22 11:40 12/30/22 16:23 12/30/22 21:17 12/31/22 08:29 12/31/22 11:16  Glucose-Capillary 70 - 99 mg/dL 161 (H) 096 (H) 045 (H) 250 (H) 238 (H) 313 (H)    Review of Glycemic Control  Diabetes history: DM2 Outpatient Diabetes medications: Metformin XR 750 mg daily Current orders for Inpatient glycemic control: Novolog 0-9 units TID with meals, Novolog 0-5 units QHS; Solumedrol 40 mg Q12H  Inpatient Diabetes Program Recommendations:    Insulin: If steroids are continued as ordered, please consider ordering Semglee 10 units Q24H.  Thanks, Orlando Penner, RN, MSN, CDCES Diabetes Coordinator Inpatient Diabetes Program [phone] (Team Pager from 8am to 5pm)

## 2022-12-31 NOTE — Progress Notes (Signed)
PROGRESS NOTE  Madison [name]    DOB: 04/09/1951, 72 y.o.  ZOX:[id number]    Code Status: Full Code   DOA: 12/30/2022   LOS: 1   Brief hospital course  Madison [name] is a 72 y.o. female with a PMH significant for COPD on prn O2 (up to 4L of O2 at night per pt), dCHF,  HTN, HLD,  DM, A fib on Xarelto, morbid obesity, who presents with shortness of breath.  They presented from home to the ED on 12/30/2022 with SOB x about 5 days. Patient has wheezing and productive cough with yellow-colored sputum production.  Denies chest pain, fever or chills. Patient is normally using as needed oxygen mainly at home, and was found to have severe respiratory distress, came in with CPAP by EMS, but still has severe respiratory distress on CPAP, cannot speak in full sentence, using accessory muscle for breathing, BiPAP started in ED. Patient does not have nausea vomiting, diarrhea or abdominal pain.  No symptoms of UTI.  Patient has severe bilateral leg edema.  Denies dark stool rectal bleeding.  No recent fall or injury.   In the ED, it was found that they had temperature normal, blood pressure 201/107, 156/81, heart rate 85, RR 30 .  Significant findings included VBG with a pH of 7.27, CO2 72, O2 131 while on BiPAP.  WBC 9.1, BP 97.9,  negative PCR for COVID, GFR> 60  Chest x-ray showed COPD without infiltration. EKG: Sinus rhythm, QTc 474, low voltage, high T wave.   They were initially treated with azithromycin, lasix, bipap, duonebs, solumedrol, metoprolol, xarelto.   Patient was admitted to medicine service for further workup and management of acute on chronic hypoxic respiratory failure as outlined in detail below.  12/31/22 -stable, improved  Assessment & Plan  Principal Problem:   Acute on chronic respiratory failure with hypoxia Active Problems:   COPD exacerbation   Acute on chronic diastolic CHF (congestive heart failure)   Paroxysmal atrial fibrillation   Essential hypertension   GERD  (gastroesophageal reflux disease)   Diabetes mellitus without complication   HLD (hyperlipidemia)   Obesity, Class III, BMI 40-49.9 (morbid obesity)  Acute on chronic respiratory failure with hypoxia due to combination of COPD and CHF exacerbation: Patient has SOB, productive cough, wheezing, rhonchi on auscultation, consistent with COPD exacerbation.  Patient has 3+ bilateral leg edema. Mild wheezing on exam today. Significant LE edema.  - continue O2 support PRN -continue Bronchodilators, antibiotic and Solu-Medrol for COPD - supportive care PRN -Lasix for CHF exacerbation   HFpEF: 05/26/2022 showed EF> 75%.  -Lasix 40 mg bid by IV -Daily weights -strict I/O's -Low salt diet -Fluid restriction   Paroxysmal atrial fibrillation: HR 80s. Not on anticoagulation due to lack of affordability.  - starting on eliquis. Consult with pharmacy- will provide 30 day free card at dc and then follow with med management until she can qualify for medicare part D. Process is being started. TOC engaged.  -continue Metoprolol   Essential hypertension- well controlled -Metoprolol -Patient is on IV Lasix   GERD (gastroesophageal reflux disease) -Protonix   Diabetes mellitus without complication: Recent A1c 8.2, poorly controlled.  Patient is taking metformin at home -Sliding scale insulin   HLD: -Lipitor   Obesity, Class III, BMI 40-49.9 (morbid obesity): Body weight 108.8 kg, BMI 46.84 -Encourage to lose some weight, -Healthy diet and exercise  Body mass index is 46.84 kg/m.  VTE ppx:  rivaroxaban (XARELTO) tablet 20 mg  Diet:     Diet   Diet 2 gram sodium Room service appropriate? Yes; Fluid consistency: Thin; Fluid restriction: 1500 mL Fluid   Consultants: None   Subjective 12/31/22    Pt reports feeling much better today. Continues to have residual cough. No more episodes of syncope. Denies CP. She is happy to be off bipap.    Objective   Vitals:   12/30/22 1828 12/30/22  2024 12/30/22 2216 12/31/22 0405  BP: (!) 149/72 (!) 154/60  126/80  Pulse: 80 87  87  Resp: Temp: 97.6 F (36.4 C) 97.9 F (36.6 C)  97.9 F (36.6 C)  TempSrc:  Oral  Oral  SpO2: 98% 97% 96% 98%  Weight:      Height:        Intake/Output Summary (Last 24 hours) at 12/31/2022 0810 Last data filed at 12/31/2022 0500 Gross per 24 hour  Intake 720 ml  Output 2100 ml  Net -1380 ml   Filed Weights   12/30/22 0533  Weight: 108.8 kg     Physical Exam:  General: awake, alert, NAD HEENT: atraumatic, clear conjunctiva, anicteric sclera, MMM, hearing grossly normal Respiratory: normal respiratory effort, conversational with horse voice. Mild wheezing on auscultation Cardiovascular: quick capillary refill, normal S1/S2, RRR, no JVD, murmurs Nervous: A&O x3. no gross focal neurologic deficits, normal speech Extremities: moves all equally, 3+ LE pitting edema, normal tone Skin: dry, intact, normal temperature, normal color. No rashes, lesions or ulcers on exposed skin Psychiatry: normal mood, congruent affect  Labs   I have personally reviewed the following labs and imaging studies CBC    Component Value Date/Time   WBC 11.4 (H) 12/31/2022 0525   RBC 4.73 12/31/2022 0525   HGB 14.3 12/31/2022 0525   HGB 16.5 (H) 09/27/2014 1657   HCT 44.1 12/31/2022 0525   HCT 51.0 (H) 09/27/2014 1657   PLT 215 12/31/2022 0525   PLT 259 09/27/2014 1657   MCV 93.2 12/31/2022 0525   MCV 93 09/27/2014 1657   MCH 30.2 12/31/2022 0525   MCHC 32.4 12/31/2022 0525   RDW 12.4 12/31/2022 0525   RDW 13.5 09/27/2014 1657   LYMPHSABS 2.1 12/30/2022 0529   LYMPHSABS 1.9 09/27/2014 1657   MONOABS 0.5 12/30/2022 0529   MONOABS 0.7 09/27/2014 1657   EOSABS 1.0 (H) 12/30/2022 0529   EOSABS 0.2 09/27/2014 1657   BASOSABS 0.1 12/30/2022 0529   BASOSABS 0.1 09/27/2014 1657      Latest Ref Rng & Units 12/31/2022    5:25 AM 12/30/2022    5:29 AM 05/18/2022   12:29 AM  BMP  Glucose 70 - 99  mg/dL 161  096  045   BUN 8 - 23 mg/dL Creatinine 0.44 - 1.00 mg/dL 4.09  8.11  9.14   Sodium 135 - 145 mmol/L 135  135  139   Potassium 3.5 - 5.1 mmol/L 4.0  4.4  3.8   Chloride 98 - 111 mmol/L 96  97  102   CO2 22 - 32 mmol/L Calcium 8.9 - 10.3 mg/dL 9.1  8.8  9.1     DG Chest Port 1 View  Result Date: 12/30/2022 CLINICAL DATA:  Shortness of breath EXAM: PORTABLE CHEST 1 VIEW COMPARISON:  05/18/2022 FINDINGS: Hyperinflation with interstitial coarsening. There is no edema, consolidation, effusion, or pneumothorax. Normal heart size and mediastinal contours. Artifact from EKG leads. IMPRESSION: COPD without  acute superimposed finding. Electronically Signed   By: Tiburcio Pea M.D.   On: 12/30/2022 05:55    Disposition Plan & Communication  Patient status: Inpatient  Admitted From: Home Planned disposition location: Home health Anticipated discharge date: 4/26 pending respiratory status stabilization, return to baseline oxygen requirement  Family Communication: none at baseline    Author: Leeroy Bock, DO [facility] 12/31/2022, 8:10 AM   Available by Epic secure chat 7AM-7PM. If 7PM-7AM, please contact night-coverage.  [facility] contact information found on [email or web address].

## 2023-01-01 ENCOUNTER — Other Ambulatory Visit: Payer: Self-pay

## 2023-01-01 DIAGNOSIS — I5023 Acute on chronic systolic (congestive) heart failure: Secondary | ICD-10-CM

## 2023-01-01 LAB — CBC
HCT: 45.8 % (ref 36.0–46.0)
Hemoglobin: 14.8 g/dL (ref 12.0–15.0)
MCH: 30.1 pg (ref 26.0–34.0)
MCHC: 32.3 g/dL (ref 30.0–36.0)
MCV: 93.3 fL (ref 80.0–100.0)
Platelets: 221 10*3/uL (ref 150–400)
RBC: 4.91 MIL/uL (ref 3.87–5.11)
RDW: 12.3 % (ref 11.5–15.5)
WBC: 10.4 10*3/uL (ref 4.0–10.5)
nRBC: 0 % (ref 0.0–0.2)

## 2023-01-01 LAB — BASIC METABOLIC PANEL
Anion gap: 11 (ref 5–15)
BUN: 27 mg/dL — ABNORMAL HIGH (ref 8–23)
CO2: 33 mmol/L — ABNORMAL HIGH (ref 22–32)
Calcium: 9.1 mg/dL (ref 8.9–10.3)
Chloride: 92 mmol/L — ABNORMAL LOW (ref 98–111)
Creatinine, Ser: 0.89 mg/dL (ref 0.44–1.00)
GFR, Estimated: >60 mL/min (ref >60)
Glucose, Bld: 277 mg/dL — ABNORMAL HIGH (ref 70–99)
Potassium: 3.8 mmol/L (ref 3.5–5.1)
Sodium: 136 mmol/L (ref 135–145)

## 2023-01-01 LAB — CK: Total CK: 114 U/L (ref 38–234)

## 2023-01-01 LAB — GLUCOSE, CAPILLARY
Glucose-Capillary: 298 mg/dL — ABNORMAL HIGH (ref 70–99)
Glucose-Capillary: 451 mg/dL — ABNORMAL HIGH (ref 70–99)

## 2023-01-01 LAB — TROPONIN I (HIGH SENSITIVITY)
Troponin I (High Sensitivity): 4 ng/L (ref <18)
Troponin I (High Sensitivity): 3 ng/L (ref <18)

## 2023-01-01 MED ORDER — NYSTATIN 100000 UNIT/ML MT SUSP
5.0000 mL | Freq: Four times a day (QID) | OROMUCOSAL | Status: DC
  Administered 2023-01-01: 500000 [IU] via OROMUCOSAL
  Filled 2023-01-01: qty 5

## 2023-01-01 MED ORDER — NYSTATIN 100000 UNIT/ML MT SUSP
5.0000 mL | Freq: Four times a day (QID) | OROMUCOSAL | 0 refills | Status: AC
  Filled 2023-01-01: qty 60, 3d supply, fill #0

## 2023-01-01 MED ORDER — PREDNISONE 20 MG PO TABS
40.0000 mg | ORAL_TABLET | Freq: Every day | ORAL | 0 refills | Status: AC
  Filled 2023-01-01: qty 6, 3d supply, fill #0

## 2023-01-01 MED ORDER — APIXABAN 5 MG PO TABS
5.0000 mg | ORAL_TABLET | Freq: Two times a day (BID) | ORAL | 0 refills | Status: DC
  Filled 2023-01-01: qty 60, 30d supply, fill #0

## 2023-01-01 MED ORDER — AZITHROMYCIN 250 MG PO TABS
250.0000 mg | ORAL_TABLET | Freq: Every day | ORAL | 0 refills | Status: AC
  Filled 2023-01-01: qty 3, 3d supply, fill #0

## 2023-01-01 MED ORDER — ALUM & MAG HYDROXIDE-SIMETH 200-200-20 MG/5ML PO SUSP
15.0000 mL | Freq: Two times a day (BID) | ORAL | Status: DC
  Administered 2023-01-01: 15 mL via ORAL
  Filled 2023-01-01: qty 30

## 2023-01-01 MED ORDER — ALUM & MAG HYDROXIDE-SIMETH 200-200-20 MG/5 ML NICU TOPICAL: 1.0000 | Freq: Two times a day (BID) | TOPICAL | Status: DC

## 2023-01-01 MED ORDER — IPRATROPIUM-ALBUTEROL 0.5-2.5 (3) MG/3ML IN SOLN
3.0000 mL | RESPIRATORY_TRACT | Status: DC | PRN
  Administered 2023-01-01: 3 mL via RESPIRATORY_TRACT
  Filled 2023-01-01: qty 3

## 2023-01-01 MED ORDER — FUROSEMIDE 40 MG PO TABS
40.0000 mg | ORAL_TABLET | Freq: Every day | ORAL | 0 refills | Status: DC
  Filled 2023-01-01: qty 30, 30d supply, fill #0

## 2023-01-01 MED ORDER — PREDNISONE 20 MG PO TABS: 40.0000 mg | ORAL_TABLET | Freq: Every day | ORAL | Status: DC

## 2023-01-01 MED ORDER — NITROGLYCERIN 0.4 MG SL SUBL: 0.4000 mg | SUBLINGUAL_TABLET | SUBLINGUAL | Status: DC | PRN

## 2023-01-01 MED ORDER — MUSCLE RUB 10-15 % EX CREA
TOPICAL_CREAM | Freq: Two times a day (BID) | CUTANEOUS | Status: DC
  Filled 2023-01-01: qty 85

## 2023-01-01 MED ORDER — FUROSEMIDE 40 MG PO TABS
40.0000 mg | ORAL_TABLET | Freq: Two times a day (BID) | ORAL | Status: DC
  Administered 2023-01-01: 40 mg via ORAL
  Filled 2023-01-01: qty 1

## 2023-01-01 NOTE — Inpatient Diabetes Management (Signed)
Inpatient Diabetes Program Recommendations  AACE/ADA: New Consensus Statement on Inpatient Glycemic Control (2015)  Target Ranges:  Prepandial:   less than 140 mg/dL      Peak postprandial:   less than 180 mg/dL (1-2 hours)      Critically ill patients:  140 - 180 mg/dL   Lab Results  Component Value Date   GLUCAP 298 (H) 01/01/2023   HGBA1C 8.2 (H) 05/18/2022    Review of Glycemic Control  Latest Reference Range & Units 12/31/22 08:29 12/31/22 11:16 12/31/22 17:44 12/31/22 21:01 01/01/23 08:36  Glucose-Capillary 70 - 99 mg/dL 161 (H) 096 (H) 045 (H) 326 (H) 298 (H)  (H): Data is abnormally high Diabetes history: DM2 Outpatient Diabetes medications: Metformin XR 750 mg daily Current orders for Inpatient glycemic control: Novolog 0-9 units TID with meals, Novolog 0-5 units QHS; Prednisone 40 mg QD   Inpatient Diabetes Program Recommendations:     Insulin: If steroids are continued as ordered, please consider ordering Semglee 10 units Q24H & increase correction to Novolog 0-15 units TID and HS  Will continue to follow while inpatient.  Thank you, Dulce Sellar, MSN, CDCES Diabetes Coordinator Inpatient Diabetes Program [phone] (team pager from 8a-5p)

## 2023-01-01 NOTE — Discharge Instructions (Signed)
For your COPD- please finish the steroid and antibiotic course as well as use your breathing treatments scheduled and as needed. Wear your oxygen as needed to keep your saturations >88%. For your heart failure exacerbation- please take the increased dose of your lasix prescribed.  For your atrial fibrillation- please take eliquis twice daily. The first 30 days has been prescribed from our pharmacy and refills will come from our medical management team.  Would recommend follow up with your pulmonologist within the next week or two Return to [place] if you need anything.

## 2023-01-01 NOTE — Discharge Summary (Signed)
Physician Discharge Summary  Patient: Madison [name] ZOX:[id number] DOB: 02/17/1951   Code Status: Full Code Admit date: 12/30/2022 Discharge date: 01/01/2023 Disposition: Home, No home health services recommended PCP: Dione Housekeeper, MD  Recommendations for Outpatient Follow-up:  Follow up with PCP within 1-2 weeks Regarding general hospital follow up and preventative care Recommend CBC, BMP Follow up with pulmonology within 2 weeks  Regarding oxygen use and inhalers Follow up with cardiology Regarding CHF and medication optimization   Discharge Diagnoses:  Principal Problem:   Acute on chronic respiratory failure with hypoxia Active Problems:   COPD exacerbation   Acute on chronic diastolic CHF (congestive heart failure)   Paroxysmal atrial fibrillation   Essential hypertension   GERD (gastroesophageal reflux disease)   Diabetes mellitus without complication   HLD (hyperlipidemia)   Obesity, Class III, BMI 40-49.9 (morbid obesity)   Hypercapnia   Respiratory distress   Acute respiratory failure with hypoxia   Acute on chronic systolic congestive heart failure  Brief Hospital Course Summary: Madison [name] is a 72 y.o. female with a PMH significant for COPD on prn O2 (up to 4L of O2 at night per pt), dCHF,  HTN, HLD,  DM, A fib on Xarelto, morbid obesity, who presents with shortness of breath.   They presented from home to the ED on 12/30/2022 with SOB x about 5 days. Patient has wheezing and productive cough with yellow-colored sputum production.  Denies chest pain, fever or chills. Patient is normally using as needed oxygen mainly at home, and was found to have severe respiratory distress, came in with CPAP by EMS, but still has severe respiratory distress on CPAP, cannot speak in full sentence, using accessory muscle for breathing, BiPAP started in ED. Patient does not have nausea vomiting, diarrhea or abdominal pain.  No symptoms of UTI.  Patient has severe  bilateral leg edema.  Denies dark stool rectal bleeding.  No recent fall or injury.    In the ED, it was found that they had temperature normal, blood pressure 201/107, 156/81, heart rate 85, RR 30 .  Significant findings included VBG with a pH of 7.27, CO2 72, O2 131 while on BiPAP.  WBC 9.1, BP 97.9,  negative PCR for COVID, GFR> 60  Chest x-ray showed COPD without infiltration. EKG: Sinus rhythm, QTc 474, low voltage, high T wave.    They were initially treated with azithromycin, lasix, bipap, duonebs, solumedrol, metoprolol, xarelto.    Patient was admitted to medicine service for further workup and management of acute on chronic hypoxic respiratory failure as outlined in detail below.   01/01/23 -stable, improved  She was prescribed the oral steroids and antibiotics to complete her COPD treatment as well as increased lasix dose for her CHF. Her swelling was much improved on day of discharge.  She endorsed some oral candidiasis which she gets frequently on steroids so prescribed nystatin mouth rinse.   Her blood sugars were elevated due to steroid use.  She was changed from xarelto to apixaban for affordability reasons.  She was prescribed breathing treatments, steroids, and antibiotic course to complete her COPD treatment as well as lasix continued for further diuresis.   All other chronic conditions were treated with home medications.   Discharge Condition: Stable, improved Recommended discharge diet: Regular healthy diet  Consultations: None   Procedures/Studies: Bipap  Allergies as of 01/01/2023       Reactions   Egg-derived Products Anaphylaxis   Egg albumin   Fentanyl  Anaphylaxis   Morphine And Related    unknown   Spiriva Handihaler [tiotropium Bromide Monohydrate] Other (See Comments)   bronchiospasms   Sulfa Antibiotics Hives   Penicillins Rash   Has patient had a PCN reaction causing immediate rash, facial/tongue/throat swelling, SOB or lightheadedness with  hypotension: YES Has patient had a PCN reaction causing severe rash involving mucus membranes or skin necrosis: NO Has patient had a PCN reaction that required hospitalization NO Has patient had a PCN reaction occurring within the last 10 years: NO If all of the above answers are "NO", then may proceed with Cephalosporin use.   Voltaren [diclofenac] Rash        Medication List     STOP taking these medications    rivaroxaban 20 MG Tabs tablet Commonly known as: XARELTO   roflumilast 500 MCG Tabs tablet Commonly known as: DALIRESP       TAKE these medications    albuterol 108 (90 Base) MCG/ACT inhaler Commonly known as: VENTOLIN HFA Inhale 2 puffs into the lungs every 4 (four) hours as needed for wheezing or shortness of breath.   apixaban 5 MG Tabs tablet Commonly known as: ELIQUIS Take 1 tablet (5 mg total) by mouth 2 (two) times daily.   atorvastatin 20 MG tablet Commonly known as: LIPITOR Take 20 mg by mouth daily.   azithromycin 250 MG tablet Commonly known as: ZITHROMAX Take 1 tablet (250 mg total) by mouth daily for 3 days. MAY ADMINISTER WITHOUT REGARD TO MEALS What changed:  how much to take how to take this when to take this additional instructions   Cholecalciferol 25 MCG (1000 UT) capsule Take 1,000 Units by mouth daily.   cyanocobalamin 500 MCG tablet Commonly known as: VITAMIN B12 Take 500 mcg by mouth daily.   dextromethorphan-guaiFENesin 30-600 MG 12hr tablet Commonly known as: MUCINEX DM Take 1 tablet by mouth 2 (two) times daily.   famotidine 20 MG tablet Commonly known as: PEPCID Take 20 mg by mouth daily.   fluticasone 50 MCG/ACT nasal spray Commonly known as: FLONASE Place 2 sprays into both nostrils daily.   fluticasone furoate-vilanterol 200-25 MCG/ACT Aepb Commonly known as: BREO ELLIPTA Inhale 1 puff into the lungs daily.   furosemide 40 MG tablet Commonly known as: LASIX Take 1 tablet (40 mg total) by mouth daily. What  changed:  medication strength how much to take   ipratropium 0.02 % nebulizer solution Commonly known as: ATROVENT Take by nebulization 4 (four) times daily.   ipratropium-albuterol 0.5-2.5 (3) MG/3ML Soln Commonly known as: DUONEB Take 3 mLs by nebulization every 6 (six) hours as needed (Shortness of breath and wheezing).   levalbuterol 0.63 MG/3ML nebulizer solution Commonly known as: XOPENEX Take by nebulization every 6 (six) hours as needed.   loratadine-pseudoephedrine 10-240 MG 24 hr tablet Commonly known as: CLARITIN-D 24-hour Take 1 tablet by mouth daily.   metFORMIN 750 MG 24 hr tablet Commonly known as: GLUCOPHAGE-XR Take 750 mg by mouth daily.   metoprolol tartrate 50 MG tablet Commonly known as: LOPRESSOR Take 50 mg by mouth 2 (two) times daily.   montelukast 10 MG tablet Commonly known as: SINGULAIR Take 1 tablet by mouth daily.   nystatin 100000 UNIT/ML suspension Commonly known as: MYCOSTATIN Use as directed 5 mLs (500,000 Units total) in the mouth or throat 4 (four) times daily for 10 days.   potassium chloride 10 MEQ tablet Commonly known as: KLOR-CON Take by mouth.   predniSONE 20 MG tablet Commonly known as:  DELTASONE Take 2 tablets (40 mg total) by mouth daily with breakfast for 3 days. Start taking on: January 02, 2023       Subjective   Pt reports feeling well. She states that she feels comfortable with going home. Happy that her legs feel much better. Breathing status feels at baseline.   All questions and concerns were addressed at time of discharge.  Objective  Blood pressure 138/68, pulse 84, temperature 98.8 F (37.1 C), resp. rate 16, height 5' (1.524 m), weight 95.8 kg, SpO2 96 %.   General: Pt is alert, awake, not in acute distress Cardiovascular: RRR, S1/S2 +, no rubs, no gallops Respiratory: CTA bilaterally, no wheezing, no rhonchi Abdominal: Soft, NT, ND, bowel sounds + Extremities: no edema, no cyanosis  The results of  significant diagnostics from this hospitalization (including imaging, microbiology, ancillary and laboratory) are listed below for reference.   Imaging studies: DG Chest Port 1 View  Result Date: 12/30/2022 CLINICAL DATA:  Shortness of breath EXAM: PORTABLE CHEST 1 VIEW COMPARISON:  05/18/2022 FINDINGS: Hyperinflation with interstitial coarsening. There is no edema, consolidation, effusion, or pneumothorax. Normal heart size and mediastinal contours. Artifact from EKG leads. IMPRESSION: COPD without acute superimposed finding. Electronically Signed   By: Tiburcio Pea M.D.   On: 12/30/2022 05:55    Labs: Basic Metabolic Panel: Recent Labs  Lab 12/30/22 0529 12/31/22 0525 01/01/23 0551  NA 135 135 136  K 4.4 4.0 3.8  CL 97* 96* 92*  CO2 30 31 33*  GLUCOSE 220* 206* 277*  BUN 15 19 27*  CREATININE 0.75 0.85 0.89  CALCIUM 8.8* 9.1 9.1  MG 2.6* 2.5*  --    CBC: Recent Labs  Lab 12/30/22 0529 12/31/22 0525 01/01/23 0551  WBC 9.1 11.4* 10.4  NEUTROABS 5.3  --   --   HGB 14.9 14.3 14.8  HCT 47.4* 44.1 45.8  MCV 95.2 93.2 93.3  PLT 228 215 221   Microbiology: Results for orders placed or performed during the hospital encounter of 12/30/22  SARS Coronavirus 2 by RT PCR (hospital order, performed in [facility] hospital lab) *cepheid single result test* Anterior Nasal Swab     Status: None   Collection Time: 12/30/22  5:29 AM   Specimen: Anterior Nasal Swab  Result Value Ref Range Status   SARS Coronavirus 2 by RT PCR NEGATIVE NEGATIVE Final    Comment: (NOTE) SARS-CoV-2 target nucleic acids are NOT DETECTED.  The SARS-CoV-2 RNA is generally detectable in upper and lower respiratory specimens during the acute phase of infection. The lowest concentration of SARS-CoV-2 viral copies this assay can detect is 250 copies / mL. A negative result does not preclude SARS-CoV-2 infection and should not be used as the sole basis for treatment or other patient management decisions.  A  negative result may occur with improper specimen collection / handling, submission of specimen other than nasopharyngeal swab, presence of viral mutation(s) within the areas targeted by this assay, and inadequate number of viral copies (<250 copies / mL). A negative result must be combined with clinical observations, patient history, and epidemiological information.  Fact Sheet for Patients:   RoadLapTop.co.za  Fact Sheet for Healthcare Providers: http://kim-miller.com/  This test is not yet approved or  cleared by the [place] FDA and has been authorized for detection and/or diagnosis of SARS-CoV-2 by FDA under an Emergency Use Authorization (EUA).  This EUA will remain in effect (meaning this test can be used) for the duration of the COVID-19  declaration under Section 564(b)(1) of the Act, 21 U.S.C. section 360bbb-3(b)(1), unless the authorization is terminated or revoked sooner.  Performed at [facility], [address] [place], [place] [address]   Culture, blood (routine x 2)     Status: None (Preliminary result)   Collection Time: 12/30/22  5:30 AM   Specimen: BLOOD  Result Value Ref Range Status   Specimen Description BLOOD LEFT ANTECUBITAL  Final   Special Requests   Final    BOTTLES DRAWN AEROBIC AND ANAEROBIC Blood Culture adequate volume   Culture   Final    NO GROWTH 4 DAYS Performed at [facility], [address] [place], [place] [address]    Report Status PENDING  Incomplete  Culture, blood (routine x 2)     Status: None (Preliminary result)   Collection Time: 12/30/22  5:31 AM   Specimen: BLOOD  Result Value Ref Range Status   Specimen Description BLOOD RIGHT HAND  Final   Special Requests   Final    BOTTLES DRAWN AEROBIC AND ANAEROBIC Blood Culture adequate volume   Culture   Final    NO GROWTH 4 DAYS Performed at [facility], [address] [place], [place] [address]     Report Status PENDING  Incomplete  Expectorated Sputum Assessment w Gram Stain, Rflx to Resp Cult     Status: None   Collection Time: 12/30/22  6:00 PM   Specimen: Sputum  Result Value Ref Range Status   Specimen Description   Final    SPU Performed at [facility], [address], [place], [place] [address]    Special Requests   Final    NONE Performed at [facility], [address], [place], [place] [address]    Sputum evaluation   Final    THIS SPECIMEN IS ACCEPTABLE FOR SPUTUM CULTURE Performed at [facility], [address] [place], [place] [address]    Report Status 12/31/2022 FINAL  Final  Culture, Respiratory w Gram Stain     Status: None   Collection Time: 12/30/22  6:00 PM   Specimen: Sputum  Result Value Ref Range Status   Specimen Description SPU  Final   Special Requests NONE Reflexed from [id number]  Final   Gram Stain   Final    RARE WBC PRESENT, PREDOMINANTLY PMN NO ORGANISMS SEEN    Culture   Final    RARE Normal respiratory flora-no Staph aureus or Pseudomonas seen Performed at [facility], [address] [place], [place] [address]    Report Status 01/02/2023 FINAL  Final   Time coordinating discharge: Over 30 minutes  Leeroy Bock, MD  [facility] 01/01/2023, 1:37 PM

## 2023-01-02 LAB — CULTURE, RESPIRATORY W GRAM STAIN: Culture: NORMAL

## 2023-01-04 LAB — CULTURE, BLOOD (ROUTINE X 2)
Culture: NO GROWTH
Culture: NO GROWTH
Special Requests: ADEQUATE
Special Requests: ADEQUATE

## 2023-01-18 ENCOUNTER — Inpatient Hospital Stay
Admission: EM | Admit: 2023-01-18 | Discharge: 2023-01-23 | DRG: 190 | Disposition: A | Discharge status: Home / Self Care | Payer: Medicare Other | Attending: Internal Medicine | Admitting: Internal Medicine

## 2023-01-18 ENCOUNTER — Other Ambulatory Visit: Payer: Self-pay

## 2023-01-18 ENCOUNTER — Emergency Department: Payer: Medicare Other

## 2023-01-18 DIAGNOSIS — I110 Hypertensive heart disease with heart failure: Secondary | ICD-10-CM | POA: Diagnosis present

## 2023-01-18 DIAGNOSIS — Z1152 Encounter for screening for COVID-19: Secondary | ICD-10-CM

## 2023-01-18 DIAGNOSIS — Z885 Allergy status to narcotic agent status: Secondary | ICD-10-CM

## 2023-01-18 DIAGNOSIS — I5033 Acute on chronic diastolic (congestive) heart failure: Secondary | ICD-10-CM | POA: Diagnosis present

## 2023-01-18 DIAGNOSIS — Z83438 Family history of other disorder of lipoprotein metabolism and other lipidemia: Secondary | ICD-10-CM

## 2023-01-18 DIAGNOSIS — Z833 Family history of diabetes mellitus: Secondary | ICD-10-CM

## 2023-01-18 DIAGNOSIS — Z880 Allergy status to penicillin: Secondary | ICD-10-CM

## 2023-01-18 DIAGNOSIS — I480 Paroxysmal atrial fibrillation: Secondary | ICD-10-CM | POA: Diagnosis present

## 2023-01-18 DIAGNOSIS — Z888 Allergy status to other drugs, medicaments and biological substances status: Secondary | ICD-10-CM

## 2023-01-18 DIAGNOSIS — Z79899 Other long term (current) drug therapy: Secondary | ICD-10-CM

## 2023-01-18 DIAGNOSIS — E785 Hyperlipidemia, unspecified: Secondary | ICD-10-CM | POA: Diagnosis present

## 2023-01-18 DIAGNOSIS — Z9049 Acquired absence of other specified parts of digestive tract: Secondary | ICD-10-CM

## 2023-01-18 DIAGNOSIS — E1165 Type 2 diabetes mellitus with hyperglycemia: Secondary | ICD-10-CM | POA: Diagnosis present

## 2023-01-18 DIAGNOSIS — Z7984 Long term (current) use of oral hypoglycemic drugs: Secondary | ICD-10-CM

## 2023-01-18 DIAGNOSIS — Z8249 Family history of ischemic heart disease and other diseases of the circulatory system: Secondary | ICD-10-CM

## 2023-01-18 DIAGNOSIS — J441 Chronic obstructive pulmonary disease with (acute) exacerbation: Secondary | ICD-10-CM | POA: Diagnosis not present

## 2023-01-18 DIAGNOSIS — I10 Essential (primary) hypertension: Secondary | ICD-10-CM | POA: Diagnosis present

## 2023-01-18 DIAGNOSIS — R0902 Hypoxemia: Secondary | ICD-10-CM

## 2023-01-18 DIAGNOSIS — Z882 Allergy status to sulfonamides status: Secondary | ICD-10-CM

## 2023-01-18 DIAGNOSIS — J9622 Acute and chronic respiratory failure with hypercapnia: Secondary | ICD-10-CM | POA: Diagnosis present

## 2023-01-18 DIAGNOSIS — Z91012 Allergy to eggs: Secondary | ICD-10-CM

## 2023-01-18 DIAGNOSIS — E1151 Type 2 diabetes mellitus with diabetic peripheral angiopathy without gangrene: Secondary | ICD-10-CM | POA: Diagnosis present

## 2023-01-18 DIAGNOSIS — Z87891 Personal history of nicotine dependence: Secondary | ICD-10-CM

## 2023-01-18 DIAGNOSIS — J9621 Acute and chronic respiratory failure with hypoxia: Secondary | ICD-10-CM | POA: Diagnosis present

## 2023-01-18 DIAGNOSIS — Z9981 Dependence on supplemental oxygen: Secondary | ICD-10-CM

## 2023-01-18 DIAGNOSIS — Z7901 Long term (current) use of anticoagulants: Secondary | ICD-10-CM

## 2023-01-18 DIAGNOSIS — E1122 Type 2 diabetes mellitus with diabetic chronic kidney disease: Secondary | ICD-10-CM | POA: Diagnosis present

## 2023-01-18 DIAGNOSIS — I5032 Chronic diastolic (congestive) heart failure: Secondary | ICD-10-CM | POA: Diagnosis present

## 2023-01-18 DIAGNOSIS — Z6841 Body Mass Index (BMI) 40.0 and over, adult: Secondary | ICD-10-CM

## 2023-01-18 DIAGNOSIS — E119 Type 2 diabetes mellitus without complications: Secondary | ICD-10-CM

## 2023-01-18 DIAGNOSIS — K219 Gastro-esophageal reflux disease without esophagitis: Secondary | ICD-10-CM | POA: Diagnosis present

## 2023-01-18 LAB — CBC WITH DIFFERENTIAL/PLATELET
Abs Immature Granulocytes: 0.04 10*3/uL (ref 0.00–0.07)
Basophils Absolute: 0.1 10*3/uL (ref 0.0–0.1)
Basophils Relative: 1 %
Eosinophils Absolute: 1.3 10*3/uL — ABNORMAL HIGH (ref 0.0–0.5)
Eosinophils Relative: 14 %
HCT: 45.9 % (ref 36.0–46.0)
Hemoglobin: 14.7 g/dL (ref 12.0–15.0)
Immature Granulocytes: 0 %
Lymphocytes Relative: 32 %
Lymphs Abs: 3 10*3/uL (ref 0.7–4.0)
MCH: 30.4 pg (ref 26.0–34.0)
MCHC: 32 g/dL (ref 30.0–36.0)
MCV: 95 fL (ref 80.0–100.0)
Monocytes Absolute: 0.7 10*3/uL (ref 0.1–1.0)
Monocytes Relative: 7 %
Neutro Abs: 4.2 10*3/uL (ref 1.7–7.7)
Neutrophils Relative %: 46 %
Platelets: 207 10*3/uL (ref 150–400)
RBC: 4.83 MIL/uL (ref 3.87–5.11)
RDW: 13 % (ref 11.5–15.5)
WBC: 9.3 10*3/uL (ref 4.0–10.5)
nRBC: 0 % (ref 0.0–0.2)

## 2023-01-18 LAB — COMPREHENSIVE METABOLIC PANEL
ALT: 51 U/L — ABNORMAL HIGH (ref 0–44)
AST: 36 U/L (ref 15–41)
Albumin: 4.5 g/dL (ref 3.5–5.0)
Alkaline Phosphatase: 90 U/L (ref 38–126)
Anion gap: 11 (ref 5–15)
BUN: 23 mg/dL (ref 8–23)
CO2: 30 mmol/L (ref 22–32)
Calcium: 9.3 mg/dL (ref 8.9–10.3)
Chloride: 97 mmol/L — ABNORMAL LOW (ref 98–111)
Creatinine, Ser: 1.01 mg/dL — ABNORMAL HIGH (ref 0.44–1.00)
GFR, Estimated: 59 mL/min — ABNORMAL LOW (ref >60)
Glucose, Bld: 171 mg/dL — ABNORMAL HIGH (ref 70–99)
Potassium: 4.2 mmol/L (ref 3.5–5.1)
Sodium: 138 mmol/L (ref 135–145)
Total Bilirubin: 1.1 mg/dL (ref 0.3–1.2)
Total Protein: 7.7 g/dL (ref 6.5–8.1)

## 2023-01-18 LAB — TROPONIN I (HIGH SENSITIVITY): Troponin I (High Sensitivity): 5 ng/L (ref <18)

## 2023-01-18 LAB — APTT: aPTT: 45 seconds — ABNORMAL HIGH (ref 24–36)

## 2023-01-18 LAB — PROTIME-INR
INR: 1.3 — ABNORMAL HIGH (ref 0.8–1.2)
Prothrombin Time: 16.3 seconds — ABNORMAL HIGH (ref 11.4–15.2)

## 2023-01-18 LAB — BRAIN NATRIURETIC PEPTIDE: B Natriuretic Peptide: 127.8 pg/mL — ABNORMAL HIGH (ref 0.0–100.0)

## 2023-01-18 LAB — LACTIC ACID, PLASMA: Lactic Acid, Venous: 1.9 mmol/L (ref 0.5–1.9)

## 2023-01-18 MED ORDER — IPRATROPIUM-ALBUTEROL 0.5-2.5 (3) MG/3ML IN SOLN
9.0000 mL | Freq: Once | RESPIRATORY_TRACT | Status: AC
  Administered 2023-01-18: 9 mL via RESPIRATORY_TRACT

## 2023-01-18 NOTE — ED Triage Notes (Signed)
Pt BIB [facility] and presents with sudden onset of ShOB that woke her from nap. At that time pt noted bilateral leg was swelling. Pt is normally on 2L O2, but pt turned it up to 4 prior to EMS. EMS admin 2 duoneb tx and 125 mg of solumedrol.   CBG 168 B/P 171/105

## 2023-01-18 NOTE — ED Provider Notes (Signed)
[facility] Provider Note    Event Date/Time   First MD Initiated Contact with Patient 01/18/23 2246     (approximate)   History   Shortness of Breath (/)   HPI  Madison [name] is a 72 y.o. female past medical history significant for COPD on baseline of 2 L of oxygen, HFpEF, hypertension, hyperlipidemia, atrial fibrillation on Eliquis, who presents to the emergency department with shortness of breath.  Patient states over the past day she has had worsening cough with shortness of breath.  Significantly worsening shortness of breath just prior to arrival.  When EMS arrived patient was hypoxic on her 4 L nasal cannula, they placed her on DuoNeb treatments.  Received 2 DuoNeb treatments and IV Solu-Medrol and route and continues to have significant shortness of breath.  States that she has bilateral lower extremity edema at baseline.  States that she has been compliant with all of her home medications.  Recent hospitalization.     Physical Exam   Triage Vital Signs: ED Triage Vitals  Enc Vitals Group     BP      Pulse      Resp      Temp      Temp src      SpO2      Weight      Height      Head Circumference      Peak Flow      Pain Score      Pain Loc      Pain Edu?      Excl. in GC?     Most recent vital signs: Vitals:   01/18/23 2330 01/18/23 2345  BP: (!) 105/94   Pulse:  88  Resp:  (!) 22  Temp:    SpO2:  100%    Physical Exam Constitutional:      Appearance: She is well-developed. She is ill-appearing.  HENT:     Head: Atraumatic.  Eyes:     Conjunctiva/sclera: Conjunctivae normal.  Cardiovascular:     Rate and Rhythm: Regular rhythm.  Pulmonary:     Effort: Respiratory distress present.     Breath sounds: Decreased breath sounds and wheezing present.     Comments: On DuoNeb treatment, tachypneic, speaking in 1-2 word sentences.  On 8 L nasal cannula. Abdominal:     General: There is no distension.  Musculoskeletal:         General: Normal range of motion.     Cervical back: Normal range of motion.     Right lower leg: Edema present.     Left lower leg: Edema present.     Comments: Bilateral lower extremity edema that is 2+ pitting.  Skin:    General: Skin is warm.     Capillary Refill: Capillary refill takes less than 2 seconds.  Neurological:     General: No focal deficit present.     Mental Status: She is alert. Mental status is at baseline.     IMPRESSION / MDM / ASSESSMENT AND PLAN / ED COURSE  I reviewed the triage vital signs and the nursing notes.  On chart review patient had a recent hospitalization at the end of April for COPD exacerbation and was treated with Lasix and oral steroids.  New onset heart failure at that time.  Changed from Xarelto to apixaban. EKG  I, Corena Herter, the attending physician, personally viewed and interpreted this ECG.  Significant wandering baseline.  Signs of  atrial enlargement.  No obvious ST elevation or depression.  No significant change when compared to prior EKG.  No tachycardic or bradycardic dysrhythmias while on cardiac telemetry.  RADIOLOGY I independently reviewed imaging, my interpretation of imaging: Chest x-ray no focal findings consistent with pneumonia  LABS (all labs ordered are listed, but only abnormal results are displayed) Labs interpreted as -    Labs Reviewed  COMPREHENSIVE METABOLIC PANEL - Abnormal; Notable for the following components:      Result Value   Chloride 97 (*)    Glucose, Bld 171 (*)    Creatinine, Ser 1.01 (*)    ALT 51 (*)    GFR, Estimated 59 (*)    All other components within normal limits  CBC WITH DIFFERENTIAL/PLATELET - Abnormal; Notable for the following components:   Eosinophils Absolute 1.3 (*)    All other components within normal limits  PROTIME-INR - Abnormal; Notable for the following components:   Prothrombin Time 16.3 (*)    INR 1.3 (*)    All other components within normal limits  APTT -  Abnormal; Notable for the following components:   aPTT 45 (*)    All other components within normal limits  BRAIN NATRIURETIC PEPTIDE - Abnormal; Notable for the following components:   B Natriuretic Peptide 127.8 (*)    All other components within normal limits  CULTURE, BLOOD (SINGLE)  LACTIC ACID, PLASMA  LACTIC ACID, PLASMA  BLOOD GAS, VENOUS  TROPONIN I (HIGH SENSITIVITY)     MDM    On arrival to the emergency department patient with obvious respiratory distress.  Speaking in one-word sentences.  Patient was placed on BiPAP.  Will obtain a VBG but no significant altered mental status.  Does appear volume overloaded with significant edema to her lower extremities.  Added on a BNP for possible heart failure exacerbation.  Given her productive cough 1 blood culture was obtained.   PROCEDURES:  Critical Care performed: yes  .Critical Care  Performed by: Corena Herter, MD Authorized by: Corena Herter, MD   Critical care provider statement:    Critical care time (minutes):  30   Critical care time was exclusive of:  Separately billable procedures and treating other patients   Critical care was necessary to treat or prevent imminent or life-threatening deterioration of the following conditions:  Respiratory failure   Critical care was time spent personally by me on the following activities:  Development of treatment plan with patient or surrogate, discussions with consultants, evaluation of patient's response to treatment, examination of patient, ordering and review of laboratory studies, ordering and review of radiographic studies, ordering and performing treatments and interventions, pulse oximetry, re-evaluation of patient's condition and review of old charts   Patient's presentation is most consistent with acute presentation with potential threat to life or bodily function.   MEDICATIONS ORDERED IN ED: Medications  ipratropium-albuterol (DUONEB) 0.5-2.5 (3) MG/3ML  nebulizer solution 9 mL (9 mLs Nebulization Given 01/18/23 2310)    FINAL CLINICAL IMPRESSION(S) / ED DIAGNOSES   Final diagnoses:  COPD exacerbation (HCC)  Hypoxia     Rx / DC Orders   ED Discharge Orders     None        Note:  This document was prepared using Dragon voice recognition software and may include unintentional dictation errors.   Corena Herter, MD 01/18/23 2356

## 2023-01-19 ENCOUNTER — Inpatient Hospital Stay (HOSPITAL_COMMUNITY)
Admit: 2023-01-19 | Discharge: 2023-01-19 | Disposition: A | Discharge status: Home / Self Care | Payer: Medicare Other | Attending: Internal Medicine | Admitting: Internal Medicine

## 2023-01-19 DIAGNOSIS — R0902 Hypoxemia: Secondary | ICD-10-CM | POA: Diagnosis present

## 2023-01-19 DIAGNOSIS — Z87891 Personal history of nicotine dependence: Secondary | ICD-10-CM | POA: Diagnosis not present

## 2023-01-19 DIAGNOSIS — Z79899 Other long term (current) drug therapy: Secondary | ICD-10-CM | POA: Diagnosis not present

## 2023-01-19 DIAGNOSIS — E785 Hyperlipidemia, unspecified: Secondary | ICD-10-CM | POA: Diagnosis present

## 2023-01-19 DIAGNOSIS — Z888 Allergy status to other drugs, medicaments and biological substances status: Secondary | ICD-10-CM | POA: Diagnosis not present

## 2023-01-19 DIAGNOSIS — I5033 Acute on chronic diastolic (congestive) heart failure: Secondary | ICD-10-CM | POA: Diagnosis present

## 2023-01-19 DIAGNOSIS — K219 Gastro-esophageal reflux disease without esophagitis: Secondary | ICD-10-CM | POA: Diagnosis present

## 2023-01-19 DIAGNOSIS — I480 Paroxysmal atrial fibrillation: Secondary | ICD-10-CM | POA: Diagnosis present

## 2023-01-19 DIAGNOSIS — Z882 Allergy status to sulfonamides status: Secondary | ICD-10-CM | POA: Diagnosis not present

## 2023-01-19 DIAGNOSIS — Z885 Allergy status to narcotic agent status: Secondary | ICD-10-CM | POA: Diagnosis not present

## 2023-01-19 DIAGNOSIS — Z7901 Long term (current) use of anticoagulants: Secondary | ICD-10-CM | POA: Diagnosis not present

## 2023-01-19 DIAGNOSIS — E1122 Type 2 diabetes mellitus with diabetic chronic kidney disease: Secondary | ICD-10-CM | POA: Diagnosis present

## 2023-01-19 DIAGNOSIS — Z9981 Dependence on supplemental oxygen: Secondary | ICD-10-CM | POA: Diagnosis not present

## 2023-01-19 DIAGNOSIS — I5031 Acute diastolic (congestive) heart failure: Secondary | ICD-10-CM

## 2023-01-19 DIAGNOSIS — E1165 Type 2 diabetes mellitus with hyperglycemia: Secondary | ICD-10-CM | POA: Diagnosis present

## 2023-01-19 DIAGNOSIS — I110 Hypertensive heart disease with heart failure: Secondary | ICD-10-CM | POA: Diagnosis present

## 2023-01-19 DIAGNOSIS — Z6841 Body Mass Index (BMI) 40.0 and over, adult: Secondary | ICD-10-CM | POA: Diagnosis not present

## 2023-01-19 DIAGNOSIS — Z880 Allergy status to penicillin: Secondary | ICD-10-CM | POA: Diagnosis not present

## 2023-01-19 DIAGNOSIS — J441 Chronic obstructive pulmonary disease with (acute) exacerbation: Secondary | ICD-10-CM | POA: Diagnosis present

## 2023-01-19 DIAGNOSIS — J9621 Acute and chronic respiratory failure with hypoxia: Secondary | ICD-10-CM | POA: Diagnosis present

## 2023-01-19 DIAGNOSIS — E1151 Type 2 diabetes mellitus with diabetic peripheral angiopathy without gangrene: Secondary | ICD-10-CM | POA: Diagnosis present

## 2023-01-19 DIAGNOSIS — Z8249 Family history of ischemic heart disease and other diseases of the circulatory system: Secondary | ICD-10-CM | POA: Diagnosis not present

## 2023-01-19 DIAGNOSIS — J9622 Acute and chronic respiratory failure with hypercapnia: Secondary | ICD-10-CM | POA: Diagnosis present

## 2023-01-19 DIAGNOSIS — Z1152 Encounter for screening for COVID-19: Secondary | ICD-10-CM | POA: Diagnosis not present

## 2023-01-19 DIAGNOSIS — Z7984 Long term (current) use of oral hypoglycemic drugs: Secondary | ICD-10-CM | POA: Diagnosis not present

## 2023-01-19 LAB — EXPECTORATED SPUTUM ASSESSMENT W GRAM STAIN, RFLX TO RESP C

## 2023-01-19 LAB — RESPIRATORY PANEL BY PCR

## 2023-01-19 LAB — SARS CORONAVIRUS 2 BY RT PCR: SARS Coronavirus 2 by RT PCR: NEGATIVE

## 2023-01-19 LAB — CULTURE, BLOOD (SINGLE): Culture: NO GROWTH

## 2023-01-19 LAB — CBG MONITORING, ED
Glucose-Capillary: 254 mg/dL — ABNORMAL HIGH (ref 70–99)
Glucose-Capillary: 298 mg/dL — ABNORMAL HIGH (ref 70–99)
Glucose-Capillary: 260 mg/dL — ABNORMAL HIGH (ref 70–99)
Glucose-Capillary: 234 mg/dL — ABNORMAL HIGH (ref 70–99)
Glucose-Capillary: 275 mg/dL — ABNORMAL HIGH (ref 70–99)

## 2023-01-19 LAB — ECHOCARDIOGRAM COMPLETE: Weight: 3597.91 oz

## 2023-01-19 LAB — BLOOD GAS, VENOUS
Acid-Base Excess: 4.9 mmol/L — ABNORMAL HIGH (ref 0.0–2.0)
Bicarbonate: 34.4 mmol/L — ABNORMAL HIGH (ref 20.0–28.0)
O2 Saturation: 58.7 %
Patient temperature: 37
pCO2, Ven: 75 mmHg (ref 44–60)
pH, Ven: 7.27 (ref 7.25–7.43)
pO2, Ven: 36 mmHg (ref 32–45)

## 2023-01-19 LAB — LACTIC ACID, PLASMA: Lactic Acid, Venous: 1.5 mmol/L (ref 0.5–1.9)

## 2023-01-19 LAB — TROPONIN I (HIGH SENSITIVITY): Troponin I (High Sensitivity): 4 ng/L (ref <18)

## 2023-01-19 LAB — HEMOGLOBIN A1C
Hgb A1c MFr Bld: 9.3 % — ABNORMAL HIGH (ref 4.8–5.6)
Mean Plasma Glucose: 220.21 mg/dL

## 2023-01-19 LAB — CULTURE, RESPIRATORY W GRAM STAIN

## 2023-01-19 MED ORDER — ALBUTEROL SULFATE (2.5 MG/3ML) 0.083% IN NEBU
2.5000 mg | INHALATION_SOLUTION | RESPIRATORY_TRACT | Status: DC | PRN
  Administered 2023-01-19 – 2023-01-22 (×6): 2.5 mg via RESPIRATORY_TRACT
  Filled 2023-01-19 (×6): qty 3

## 2023-01-19 MED ORDER — APIXABAN 5 MG PO TABS
5.0000 mg | ORAL_TABLET | Freq: Two times a day (BID) | ORAL | Status: DC
  Administered 2023-01-19 – 2023-01-23 (×9): 5 mg via ORAL
  Filled 2023-01-19 (×9): qty 1

## 2023-01-19 MED ORDER — DM-GUAIFENESIN ER 30-600 MG PO TB12
1.0000 | ORAL_TABLET | Freq: Two times a day (BID) | ORAL | Status: DC
  Administered 2023-01-19 – 2023-01-23 (×9): 1 via ORAL
  Filled 2023-01-19 (×10): qty 1

## 2023-01-19 MED ORDER — INSULIN ASPART 100 UNIT/ML IJ SOLN
0.0000 [IU] | Freq: Every day | INTRAMUSCULAR | Status: DC
  Administered 2023-01-19 – 2023-01-20 (×3): 3 [IU] via SUBCUTANEOUS
  Administered 2023-01-21: 5 [IU] via SUBCUTANEOUS
  Administered 2023-01-22: 4 [IU] via SUBCUTANEOUS
  Filled 2023-01-19 (×5): qty 1

## 2023-01-19 MED ORDER — POTASSIUM CHLORIDE CRYS ER 10 MEQ PO TBCR
10.0000 meq | EXTENDED_RELEASE_TABLET | Freq: Every day | ORAL | Status: DC
  Administered 2023-01-19 – 2023-01-23 (×5): 10 meq via ORAL
  Filled 2023-01-19 (×5): qty 1

## 2023-01-19 MED ORDER — FUROSEMIDE 10 MG/ML IJ SOLN
40.0000 mg | Freq: Two times a day (BID) | INTRAMUSCULAR | Status: DC
  Administered 2023-01-19 – 2023-01-23 (×9): 40 mg via INTRAVENOUS
  Filled 2023-01-19 (×10): qty 4

## 2023-01-19 MED ORDER — SODIUM CHLORIDE 0.9 % IV SOLN
1.0000 g | INTRAVENOUS | Status: AC
  Administered 2023-01-19 – 2023-01-23 (×5): 1 g via INTRAVENOUS
  Filled 2023-01-19 (×5): qty 10

## 2023-01-19 MED ORDER — PREDNISONE 20 MG PO TABS
40.0000 mg | ORAL_TABLET | Freq: Every day | ORAL | Status: DC
  Administered 2023-01-20 – 2023-01-21 (×2): 40 mg via ORAL
  Filled 2023-01-19 (×2): qty 2

## 2023-01-19 MED ORDER — ATORVASTATIN CALCIUM 20 MG PO TABS
20.0000 mg | ORAL_TABLET | Freq: Every day | ORAL | Status: DC
  Administered 2023-01-19 – 2023-01-23 (×5): 20 mg via ORAL
  Filled 2023-01-19 (×5): qty 1

## 2023-01-19 MED ORDER — METOPROLOL TARTRATE 50 MG PO TABS
50.0000 mg | ORAL_TABLET | Freq: Two times a day (BID) | ORAL | Status: DC
  Administered 2023-01-19 – 2023-01-23 (×9): 50 mg via ORAL
  Filled 2023-01-19 (×10): qty 1

## 2023-01-19 MED ORDER — INSULIN ASPART 100 UNIT/ML IJ SOLN
0.0000 [IU] | Freq: Three times a day (TID) | INTRAMUSCULAR | Status: DC
  Administered 2023-01-19: 11 [IU] via SUBCUTANEOUS
  Administered 2023-01-19: 7 [IU] via SUBCUTANEOUS
  Administered 2023-01-19: 11 [IU] via SUBCUTANEOUS
  Administered 2023-01-20: 7 [IU] via SUBCUTANEOUS
  Administered 2023-01-20: 4 [IU] via SUBCUTANEOUS
  Administered 2023-01-20: 7 [IU] via SUBCUTANEOUS
  Administered 2023-01-21: 20 [IU] via SUBCUTANEOUS
  Administered 2023-01-21: 4 [IU] via SUBCUTANEOUS
  Administered 2023-01-21 – 2023-01-22 (×2): 11 [IU] via SUBCUTANEOUS
  Administered 2023-01-22: 3 [IU] via SUBCUTANEOUS
  Administered 2023-01-22: 7 [IU] via SUBCUTANEOUS
  Administered 2023-01-23: 4 [IU] via SUBCUTANEOUS
  Filled 2023-01-19 (×12): qty 1

## 2023-01-19 MED ORDER — ONDANSETRON HCL 4 MG PO TABS: 4.0000 mg | ORAL_TABLET | Freq: Four times a day (QID) | ORAL | Status: DC | PRN

## 2023-01-19 MED ORDER — ONDANSETRON HCL 4 MG/2ML IJ SOLN: 4.0000 mg | Freq: Four times a day (QID) | INTRAMUSCULAR | Status: DC | PRN

## 2023-01-19 MED ORDER — ACETAMINOPHEN 325 MG PO TABS
650.0000 mg | ORAL_TABLET | Freq: Four times a day (QID) | ORAL | Status: DC | PRN
  Administered 2023-01-19 – 2023-01-21 (×4): 650 mg via ORAL
  Filled 2023-01-19 (×4): qty 2

## 2023-01-19 MED ORDER — METHYLPREDNISOLONE SODIUM SUCC 40 MG IJ SOLR
40.0000 mg | Freq: Two times a day (BID) | INTRAMUSCULAR | Status: AC
  Administered 2023-01-19 (×2): 40 mg via INTRAVENOUS
  Filled 2023-01-19 (×2): qty 1

## 2023-01-19 MED ORDER — LORATADINE 10 MG PO TABS
10.0000 mg | ORAL_TABLET | Freq: Every day | ORAL | Status: DC
  Administered 2023-01-19 – 2023-01-23 (×5): 10 mg via ORAL
  Filled 2023-01-19 (×5): qty 1

## 2023-01-19 MED ORDER — ACETAMINOPHEN 650 MG RE SUPP: 650.0000 mg | Freq: Four times a day (QID) | RECTAL | Status: DC | PRN

## 2023-01-19 MED ORDER — LORAZEPAM 0.5 MG PO TABS
0.5000 mg | ORAL_TABLET | Freq: Four times a day (QID) | ORAL | Status: DC | PRN
  Administered 2023-01-19 – 2023-01-22 (×4): 0.5 mg via ORAL
  Filled 2023-01-19 (×4): qty 1

## 2023-01-19 MED ORDER — IPRATROPIUM-ALBUTEROL 0.5-2.5 (3) MG/3ML IN SOLN
3.0000 mL | Freq: Four times a day (QID) | RESPIRATORY_TRACT | Status: DC
  Administered 2023-01-19 – 2023-01-21 (×10): 3 mL via RESPIRATORY_TRACT
  Filled 2023-01-19: qty 6
  Filled 2023-01-19 (×8): qty 3

## 2023-01-19 NOTE — H&P (Signed)
History and Physical    Patient: Madison [name] ZOX:[id number] DOB: 11-03-1950 DOA: 01/18/2023 DOS: the patient was seen and examined on 01/19/2023 PCP: Dione Housekeeper, MD  Patient coming from: Home  Chief Complaint:  Chief Complaint  Patient presents with   Shortness of Breath         HPI: Madison [name] is a 72 y.o. female with medical history significant for COPD on home O2 at 2 L dCHF, HTN, HLD, DM, A fib on Xarelto, morbid obesity, hospitalized from 4/23 to 4/25 with respiratory failure secondary to COPD and CHF exacerbation initially requiring BiPAP who presents to the ED with shortness of breath waking her up from sleep as well.  She noted lower extremity edema over the past couple days.  She tried turning up her oxygen to 4 L without improvement and called EMS.  EMS administered DuoNebs x 2 and Solu-Medrol 125 and route.  She denied chest pain, fever or chills.  She was placed on BiPAP on arrival to the ED. ED course and data review: O2 sat at 100% on BiPAP, other vitals unremarkable.  VBG with pH 7.27 and pCO2 75.  BNP 127.8 and troponin 5.  CBC WNL. EKG, personally viewed and interpreted showing sinus at 91 with nonspecific ST-T wave changes. Chest x-ray with no active disease Patient was treated with additional DuoNebs. Hospitalist consulted for admission.   Review of Systems: As mentioned in the history of present illness. All other systems reviewed and are negative.  Past Medical History:  Diagnosis Date   Anemia    Arthritis    Blood transfusion without reported diagnosis    Chronic kidney disease 05/22/2016   urinary retention  has catheter      Edema extremities    Hyperlipidemia    Peripheral vascular disease (HCC)    Pneumonia 11/2013   Hx. pneumonia      Seasonal allergies    Tachycardia    Past Surgical History:  Procedure Laterality Date   ANKLE SURGERY Right    APPENDECTOMY     BACK SURGERY  05/2016   CARPAL TUNNEL RELEASE Left     CHOLECYSTECTOMY     TONSILLECTOMY     TUBAL LIGATION     VEIN LIGATION AND STRIPPING     Social History:  reports that she quit smoking about 2 years ago. Her smoking use included cigarettes. She has a 0.25 pack-year smoking history. She has never used smokeless tobacco. She reports current alcohol use. She reports that she does not use drugs.  Allergies  Allergen Reactions   Egg-Derived Products Anaphylaxis    Egg albumin   Fentanyl Anaphylaxis   Morphine And Related     unknown   Spiriva Handihaler [Tiotropium Bromide Monohydrate] Other (See Comments)    bronchiospasms   Sulfa Antibiotics Hives   Penicillins Rash    Has patient had a PCN reaction causing immediate rash, facial/tongue/throat swelling, SOB or lightheadedness with hypotension: YES Has patient had a PCN reaction causing severe rash involving mucus membranes or skin necrosis: NO Has patient had a PCN reaction that required hospitalization NO Has patient had a PCN reaction occurring within the last 10 years: NO If all of the above answers are "NO", then may proceed with Cephalosporin use.   Voltaren [Diclofenac] Rash    Family History  Problem Relation Age of Onset   Heart disease Mother    Hypertension Mother    Hyperlipidemia Mother    Diabetes Brother  Prior to Admission medications   Medication Sig Start Date End Date Taking? Authorizing Provider  albuterol (VENTOLIN HFA) 108 (90 Base) MCG/ACT inhaler Inhale 2 puffs into the lungs every 4 (four) hours as needed for wheezing or shortness of breath. 07/03/22 07/03/23  [provider]  apixaban (ELIQUIS) 5 MG TABS tablet Take 1 tablet (5 mg total) by mouth 2 (two) times daily. 01/01/23 01/31/23  Leeroy Bock, MD  atorvastatin (LIPITOR) 20 MG tablet Take 20 mg by mouth daily. 08/27/22 08/27/23  [provider]  Cholecalciferol 25 MCG (1000 UT) capsule Take 1,000 Units by mouth daily.    [provider]  cyanocobalamin (VITAMIN  B12) 500 MCG tablet Take 500 mcg by mouth daily.    [provider]  dextromethorphan-guaiFENesin (MUCINEX DM) 30-600 MG 12hr tablet Take 1 tablet by mouth 2 (two) times daily. 05/19/22   Arnetha Courser, MD  famotidine (PEPCID) 20 MG tablet Take 20 mg by mouth daily. 05/15/20   [provider]  fluticasone (FLONASE) 50 MCG/ACT nasal spray Place 2 sprays into both nostrils daily.     [provider]  fluticasone furoate-vilanterol (BREO ELLIPTA) 200-25 MCG/ACT AEPB Inhale 1 puff into the lungs daily. 11/27/21   [provider]  furosemide (LASIX) 40 MG tablet Take 1 tablet (40 mg total) by mouth daily. 01/01/23 01/31/23  Leeroy Bock, MD  ipratropium (ATROVENT) 0.02 % nebulizer solution Take by nebulization 4 (four) times daily.    [provider]  ipratropium-albuterol (DUONEB) 0.5-2.5 (3) MG/3ML SOLN Take 3 mLs by nebulization every 6 (six) hours as needed (Shortness of breath and wheezing). 05/19/22   Arnetha Courser, MD  levalbuterol Pauline Aus) 0.63 MG/3ML nebulizer solution Take by nebulization every 6 (six) hours as needed. 02/21/22   [provider]  loratadine-pseudoephedrine (CLARITIN-D 24-HOUR) 10-240 MG 24 hr tablet Take 1 tablet by mouth daily.    [provider]  metFORMIN (GLUCOPHAGE-XR) 750 MG 24 hr tablet Take 750 mg by mouth daily. 05/14/22   [provider]  metoprolol (LOPRESSOR) 50 MG tablet Take 50 mg by mouth 2 (two) times daily.  02/26/16   [provider]  montelukast (SINGULAIR) 10 MG tablet Take 1 tablet by mouth daily. 07/03/22 07/03/23  [provider]  potassium chloride (KLOR-CON) 10 MEQ tablet Take by mouth. 10/24/21   [provider]    Physical Exam: Vitals:   01/18/23 2310 01/18/23 2330 01/18/23 2345 01/19/23 0030  BP:  (!) 105/94  122/64  Pulse:   88 84  Resp:   (!) 22 17  Temp:      TempSrc:      SpO2: 100%  100% 100%  Weight:      Height:       Physical  Exam Vitals and nursing note reviewed.  Constitutional:      General: She is not in acute distress.    Comments: Sitting upright in bed, speaking in one-word sentences.    HENT:     Head: Normocephalic and atraumatic.  Cardiovascular:     Rate and Rhythm: Normal rate and regular rhythm.     Heart sounds: Normal heart sounds.  Pulmonary:     Effort: Tachypnea present.     Breath sounds: Wheezing, rhonchi and rales present.  Abdominal:     Palpations: Abdomen is soft.     Tenderness: There is no abdominal tenderness.  Musculoskeletal:     Right lower leg: Edema present.     Left lower leg: Edema  present.  Neurological:     Mental Status: Mental status is at baseline.     Labs on Admission: I have personally reviewed following labs and imaging studies  CBC: Recent Labs  Lab 01/18/23 2259  WBC 9.3  NEUTROABS 4.2  HGB 14.7  HCT 45.9  MCV 95.0  PLT 207   Basic Metabolic Panel: Recent Labs  Lab 01/18/23 2259  NA 138  K 4.2  CL 97*  CO2 30  GLUCOSE 171*  BUN 23  CREATININE 1.01*  CALCIUM 9.3   GFR: Estimated Creatinine Clearance: 53.3 mL/min (A) (by C-G formula based on SCr of 1.01 mg/dL (H)). Liver Function Tests: Recent Labs  Lab 01/18/23 2259  AST 36  ALT 51*  ALKPHOS 90  BILITOT 1.1  PROT 7.7  ALBUMIN 4.5   No results for input(s): "LIPASE", "AMYLASE" in the last 168 hours. No results for input(s): "AMMONIA" in the last 168 hours. Coagulation Profile: Recent Labs  Lab 01/18/23 2259  INR 1.3*   Cardiac Enzymes: No results for input(s): "CKTOTAL", "CKMB", "CKMBINDEX", "TROPONINI" in the last 168 hours. BNP (last 3 results) No results for input(s): "PROBNP" in the last 8760 hours. HbA1C: No results for input(s): "HGBA1C" in the last 72 hours. CBG: No results for input(s): "GLUCAP" in the last 168 hours. Lipid Profile: No results for input(s): "CHOL", "HDL", "LDLCALC", "TRIG", "CHOLHDL", "LDLDIRECT" in the last 72 hours. Thyroid Function  Tests: No results for input(s): "TSH", "T4TOTAL", "FREET4", "T3FREE", "THYROIDAB" in the last 72 hours. Anemia Panel: No results for input(s): "VITAMINB12", "FOLATE", "FERRITIN", "TIBC", "IRON", "RETICCTPCT" in the last 72 hours. Urine analysis:    Component Value Date/Time   COLORURINE Yellow 09/27/2014 1820   APPEARANCEUR Hazy 09/27/2014 1820   LABSPEC 1.025 09/27/2014 1820   PHURINE 5.0 09/27/2014 1820   GLUCOSEU Negative 09/27/2014 1820   HGBUR 1+ 09/27/2014 1820   BILIRUBINUR 1+ 09/27/2014 1820   KETONESUR Negative 09/27/2014 1820   PROTEINUR 100 mg/dL 04/54/0981 1914   NITRITE Negative 09/27/2014 1820   LEUKOCYTESUR Negative 09/27/2014 1820    Radiological Exams on Admission: DG Chest Port 1 View  Result Date: 01/18/2023 CLINICAL DATA:  Questionable sepsis EXAM: PORTABLE CHEST 1 VIEW COMPARISON:  Chest x-ray 12/30/2022 FINDINGS: The heart size and mediastinal contours are within normal limits. Both lungs are clear. The visualized skeletal structures are unremarkable. IMPRESSION: No active disease. Electronically Signed   By: Darliss Cheney M.D.   On: 01/18/2023 23:08     Data Reviewed: Relevant notes from primary care and specialist visits, past discharge summaries as available in EHR, including Care Everywhere. Prior diagnostic testing as pertinent to current admission diagnoses Updated medications and problem lists for reconciliation ED course, including vitals, labs, imaging, treatment and response to treatment Triage notes, nursing and pharmacy notes and ED provider's notes Notable results as noted in HPI   Assessment and Plan: No notes have been filed under this hospital service. Service: Hospitalist  Acute on chronic respiratory failure with hypoxia due to combination of COPD and CHF exacerbation: -Admitted to PCU as inpatient -BiPAP, try to wean off BiPAP -Bronchodilators, antibiotic and Solu-Medrol for COPD -Lasix for CHF exacerbation -Nasal cannula oxygen to  maintain oxygen saturation above 93% when patient is off BiPAP   COPD exacerbation -Bronchodilators -Solu-Medrol 40 mg IV bid -rocephin -Mucinex for cough  -Incentive spirometry -sputum culture   Acute on chronic diastolic CHF (congestive heart failure): Today: 05/26/2022 showed EF> 75%.  -Lasix 40 mg bid by IV -Daily weights -strict  I/O's -Low salt diet -Fluid restriction -echo   Paroxysmal atrial fibrillation:  Chronic anticoagulation - Continue Eliquis and metoprolol  Essential hypertension -IV hydralazine as needed -Metoprolol -Patient is on IV Lasix   GERD (gastroesophageal reflux disease) -Protonix   Diabetes mellitus without complication:  -Sliding scale insulin   HLD: -Lipitor   Obesity, Class III, BMI 40-49.9 (morbid obesity): Body weight 108.8 kg, BMI 46.84 -Complicating factor to overall prognosis and care    DVT prophylaxis: Eliquis Consults: none  Advance Care Planning:   Code Status: Prior   Family Communication: none  Disposition Plan: Back to previous home environment  Severity of Illness: The appropriate patient status for this patient is INPATIENT. Inpatient status is judged to be reasonable and necessary in order to provide the required intensity of service to ensure the patient's safety. The patient's presenting symptoms, physical exam findings, and initial radiographic and laboratory data in the context of their chronic comorbidities is felt to place them at high risk for further clinical deterioration. Furthermore, it is not anticipated that the patient will be medically stable for discharge from the hospital within 2 midnights of admission.   * I certify that at the point of admission it is my clinical judgment that the patient will require inpatient hospital care spanning beyond 2 midnights from the point of admission due to high intensity of service, high risk for further deterioration and high frequency of surveillance  required.*  Author: Andris Baumann, MD 01/19/2023 1:17 AM  For on call review [email or web address].

## 2023-01-19 NOTE — Hospital Course (Addendum)
Taken from H&P.  Madison [name] is a 72 y.o. female with medical history significant for COPD on home O2 at 2 L dCHF, HTN, HLD, DM, A fib on Xarelto, morbid obesity, hospitalized from 4/23 to 4/25 with respiratory failure secondary to COPD and CHF exacerbation initially requiring BiPAP who presents to the ED with shortness of breath waking her up from sleep as well.   ED course and data review: O2 sat at 100% on BiPAP, other vitals unremarkable.  VBG with pH 7.27 and pCO2 75.  BNP 127.8 and troponin 5.  CBC WNL. EKG, showing sinus at 91 with nonspecific ST-T wave changes. Chest x-ray with no active disease Patient was treated with additional DuoNebs.

## 2023-01-19 NOTE — ED Provider Notes (Signed)
Vitals:   01/18/23 2330 01/18/23 2345  BP: (!) 105/94   Pulse:  88  Resp:  (!) 22  Temp:    SpO2:  100%    Patient currently resting comfortably on BiPAP.  Fully alert and oriented.  Asking for warm blanket without distress  Venous gas 7.3 pCO2 75 glucose 170, BNP slightly elevated 130 CBC normal.  Chest x-ray clear  ----------------------------------------- 12:20 AM on 01/19/2023 ----------------------------------------- Placed consult requesting admission to the hospitalist service, discussed with Dr. Para March  Patient understanding agreeable plan for admission   Sharyn Creamer, MD 01/19/23 0120

## 2023-01-19 NOTE — Progress Notes (Signed)
No charge progress note.  Madison [name] is a 72 y.o. female with medical history significant for COPD on home O2 at 2 L dCHF, HTN, HLD, DM, A fib on Xarelto, morbid obesity, hospitalized from 4/23 to 4/25 with respiratory failure secondary to COPD and CHF exacerbation initially requiring BiPAP who presents to the ED with shortness of breath waking her up from sleep as well.   ED course and data review: O2 sat at 100% on BiPAP, other vitals unremarkable.  VBG with pH 7.27 and pCO2 75.  BNP 127.8 and troponin 5.  CBC WNL. EKG, showing sinus at 91 with nonspecific ST-T wave changes. Chest x-ray with no active disease Patient was treated with additional DuoNebs.  Patient was able to weaned off from BiPAP but remained very short of breath.  Significant coughing with some brownish sputum, culture was ordered.  Also checking for COVID and respiratory viral panel.  On exam she was an obese lady and was having bilateral wheeze and 1+ lower extremity edema.  We will continue with current management and follow-up respiratory viral panel and COVID PCR.

## 2023-01-20 LAB — CBG MONITORING, ED
Glucose-Capillary: 212 mg/dL — ABNORMAL HIGH (ref 70–99)
Glucose-Capillary: 189 mg/dL — ABNORMAL HIGH (ref 70–99)

## 2023-01-20 LAB — ECHOCARDIOGRAM COMPLETE
Area-P 1/2: 4.68 cm2
Est EF: >75
Height: 60 in
S' Lateral: 3.1 cm

## 2023-01-20 LAB — MRSA NEXT GEN BY PCR, NASAL: MRSA by PCR Next Gen: NOT DETECTED

## 2023-01-20 LAB — CULTURE, RESPIRATORY W GRAM STAIN

## 2023-01-20 LAB — GLUCOSE, CAPILLARY
Glucose-Capillary: 248 mg/dL — ABNORMAL HIGH (ref 70–99)
Glucose-Capillary: 260 mg/dL — ABNORMAL HIGH (ref 70–99)

## 2023-01-20 NOTE — Progress Notes (Signed)
PROGRESS NOTE    Zarianna Froehlich  ZOX:[id number] DOB: 1951-06-30 DOA: 01/18/2023 PCP: Dione Housekeeper, MD   Brief Narrative:  Madison [name] is a 72 y.o. female with medical history significant for COPD not previously on oxygen having just been placed on nocturnal oxygen (2L) during last hospitalization 2 weeks ago, diastolic HF, HTN, HLD, NIDDM, A fib on Xarelto, morbid obesity, hospitalized from 4/23 to 4/25 with respiratory failure secondary to COPD and CHF exacerbation initially requiring BiPAP who presents to the ED with similar episodes to last hospitalization as well as increased shortness of breath waking her up from sleep.  She admits to worsening edema requiring increased oxygen (4L) at home to maintain appropriate saturations and symptom management.  Presents to the ED BiPAP dependent via EMS with above complaints, hospitalist called for admission.   Assessment & Plan:   Principal Problem:   COPD with acute exacerbation (HCC) Active Problems:   Acute on chronic respiratory failure with hypoxia and hypercapnia (HCC)   Acute on chronic diastolic CHF (congestive heart failure) (HCC)   Paroxysmal atrial fibrillation (HCC)   Chronic anticoagulation   Essential hypertension   Diabetes mellitus without complication (HCC)   Obesity, Class III, BMI 40-49.9 (morbid obesity) (HCC)   Acute on chronic respiratory failure with hypoxia due to combination of COPD and CHF exacerbation: -Weaned off BiPAP overnight, continue to wean oxygen as tolerated   COPD exacerbation -Continue supportive care, bronchodilators, high-dose steroids -Continue ceftriaxone  Acute on chronic diastolic CHF (congestive heart failure):  -05/26/2022 showed EF> 75%.  Repeat echo similar EF greater than 75% -Lasix 40 mg bid by IV -Continue daily weights, I's and O's, low-salt fluid restricted diet   Paroxysmal atrial fibrillation:  Chronic anticoagulation - Continue Eliquis and metoprolol    Essential hypertension -IV hydralazine as needed -Continue metoprolol   GERD (gastroesophageal reflux disease) -Protonix   Diabetes mellitus without complication, uncontrolled -Sliding scale insulin -A1c 9.3   HLD: -Lipitor   Obesity, Class III, BMI 40-49.9 (morbid obesity):  - Body weight 108.8 kg, BMI 46.84 - Complicating factor to overall prognosis and care  DVT prophylaxis: Eliquis Code Status: Full Family Communication: None present  Status is: Inpatient  Dispo: The patient is from: Home              Anticipated d/c is to: To be determined              Anticipated d/c date is: 72+ hours              Patient currently not medically stable for discharge  Consultants:  None  Procedures:  None  Antimicrobials:  Ceftriaxone  Subjective: No acute issues or events overnight, respiratory status improving but not yet back to baseline.  Denies nausea vomiting diarrhea constipation headache fevers chills or chest pain  Objective: Vitals:   01/20/23 0000 01/20/23 0200 01/20/23 0400 01/20/23 0640  BP: 122/71 (!) 101/53 (!) 107/45 (!) 107/53  Pulse: 88 77 73 84  Resp: 17 20 20 20   Temp:    98.5 F (36.9 C)  TempSrc:      SpO2: 99% 100% 100% 97%  Weight:      Height:        Intake/Output Summary (Last 24 hours) at 01/20/2023 0742 Last data filed at 01/19/2023 2104 Gross per 24 hour  Intake --  Output 1500 ml  Net -1500 ml   Filed Weights   01/18/23 2248  Weight: 102 kg    Examination:  General:  Pleasantly resting in bed, No acute distress. HEENT:  Normocephalic atraumatic.  Sclerae nonicteric, noninjected.  Extraocular movements intact bilaterally. Neck:  Without mass or deformity.  Trachea is midline. Lungs: Bilateral wheeze without overt rales Heart:  Regular rate and rhythm.  Without murmurs, rubs, or gallops. Abdomen:  Soft, nontender, nondistended.  Without guarding or rebound. Extremities: Without cyanosis, clubbing, edema, or obvious  deformity. Vascular:  Dorsalis pedis and posterior tibial pulses palpable bilaterally. Skin:  Warm and dry, no erythema, no ulcerations.  Data Reviewed: I have personally reviewed following labs and imaging studies  CBC: Recent Labs  Lab 01/18/23 2259  WBC 9.3  NEUTROABS 4.2  HGB 14.7  HCT 45.9  MCV 95.0  PLT 207   Basic Metabolic Panel: Recent Labs  Lab 01/18/23 2259  NA 138  K 4.2  CL 97*  CO2 30  GLUCOSE 171*  BUN 23  CREATININE 1.01*  CALCIUM 9.3   GFR: Estimated Creatinine Clearance: 53.3 mL/min (A) (by C-G formula based on SCr of 1.01 mg/dL (H)). Liver Function Tests: Recent Labs  Lab 01/18/23 2259  AST 36  ALT 51*  ALKPHOS 90  BILITOT 1.1  PROT 7.7  ALBUMIN 4.5   Coagulation Profile: Recent Labs  Lab 01/18/23 2259  INR 1.3*   HbA1C: Recent Labs    01/19/23 1405  HGBA1C 9.3*   CBG: Recent Labs  Lab 01/19/23 0311 01/19/23 0919 01/19/23 1143 01/19/23 1623 01/19/23 2148  GLUCAP 254* 298* 260* 234* 275*   Sepsis Labs: Recent Labs  Lab 01/18/23 2259 01/19/23 0052  LATICACIDVEN 1.9 1.5    Recent Results (from the past 240 hour(s))  Blood culture (routine single)     Status: None (Preliminary result)   Collection Time: 01/18/23 10:59 PM   Specimen: BLOOD  Result Value Ref Range Status   Specimen Description BLOOD BLOOD RIGHT ARM  Final   Special Requests   Final    BOTTLES DRAWN AEROBIC AND ANAEROBIC Blood Culture results may not be optimal due to an excessive volume of blood received in culture bottles   Culture   Final    NO GROWTH < 12 HOURS Performed at [facility], [address], [place], [place] [address]    Report Status PENDING  Incomplete  Expectorated Sputum Assessment w Gram Stain, Rflx to Resp Cult     Status: None   Collection Time: 01/19/23  9:34 AM   Specimen: Sputum  Result Value Ref Range Status   Specimen Description SPUTUM  Final   Special Requests NONE  Final   Sputum evaluation   Final    THIS  SPECIMEN IS ACCEPTABLE FOR SPUTUM CULTURE Performed at [facility], [address], [place], [place] [address]    Report Status 01/19/2023 FINAL  Final  Culture, Respiratory w Gram Stain     Status: None (Preliminary result)   Collection Time: 01/19/23  9:34 AM   Specimen: SPU  Result Value Ref Range Status   Specimen Description   Final    SPUTUM Performed at [facility], [address], [place], [place] [address]    Special Requests   Final    NONE Reflexed from [id number] Performed at [facility], [address] [place], [place] [address]    Gram Stain   Final    ABUNDANT WBC PRESENT, PREDOMINANTLY PMN RARE GRAM POSITIVE COCCI IN PAIRS Performed at [facility], [address] [place], [place] [address]    Culture PENDING  Incomplete   Report Status PENDING  Incomplete  SARS Coronavirus 2 by RT PCR (hospital order, performed in [facility] hospital lab) *cepheid single result test* Anterior Nasal Swab     Status: None   Collection Time: 01/19/23  1:56 PM   Specimen: Anterior Nasal Swab  Result Value Ref Range Status   SARS Coronavirus 2 by RT PCR NEGATIVE NEGATIVE Final    Comment: (NOTE) SARS-CoV-2 target nucleic acids are NOT DETECTED.  The SARS-CoV-2 RNA is generally detectable in upper and lower respiratory specimens during the acute phase of infection. The lowest concentration of SARS-CoV-2 viral copies this assay can detect is 250 copies / mL. A negative result does not preclude SARS-CoV-2 infection and should not be used as the sole basis for treatment or other patient management decisions.  A negative result may occur with improper specimen collection / handling, submission of specimen other than nasopharyngeal swab, presence of viral mutation(s) within the areas targeted by this assay, and inadequate number of viral copies (<250 copies / mL). A negative result must be combined with clinical observations, patient history, and  epidemiological information.  Fact Sheet for Patients:   RoadLapTop.co.za  Fact Sheet for Healthcare Providers: http://kim-miller.com/  This test is not yet approved or  cleared by the [place] FDA and has been authorized for detection and/or diagnosis of SARS-CoV-2 by FDA under an Emergency Use Authorization (EUA).  This EUA will remain in effect (meaning this test can be used) for the duration of the COVID-19 declaration under Section 564(b)(1) of the Act, 21 U.S.C. section 360bbb-3(b)(1), unless the authorization is terminated or revoked sooner.  Performed at [facility], [address] [place], [place] [address]   Respiratory (~20 pathogens) panel by PCR     Status: None   Collection Time: 01/19/23  1:56 PM   Specimen: Nasopharyngeal Swab; Respiratory  Result Value Ref Range Status   Adenovirus NOT DETECTED NOT DETECTED Final   Coronavirus 229E NOT DETECTED NOT DETECTED Final    Comment: (NOTE) The Coronavirus on the Respiratory Panel, DOES NOT test for the novel  Coronavirus (2019 nCoV)    Coronavirus HKU1 NOT DETECTED NOT DETECTED Final   Coronavirus NL63 NOT DETECTED NOT DETECTED Final   Coronavirus OC43 NOT DETECTED NOT DETECTED Final   Metapneumovirus NOT DETECTED NOT DETECTED Final   Rhinovirus / Enterovirus NOT DETECTED NOT DETECTED Final   Influenza A NOT DETECTED NOT DETECTED Final   Influenza B NOT DETECTED NOT DETECTED Final   Parainfluenza Virus 1 NOT DETECTED NOT DETECTED Final   Parainfluenza Virus 2 NOT DETECTED NOT DETECTED Final   Parainfluenza Virus 3 NOT DETECTED NOT DETECTED Final   Parainfluenza Virus 4 NOT DETECTED NOT DETECTED Final   Respiratory Syncytial Virus NOT DETECTED NOT DETECTED Final   Bordetella pertussis NOT DETECTED NOT DETECTED Final   Bordetella Parapertussis NOT DETECTED NOT DETECTED Final   Chlamydophila pneumoniae NOT DETECTED NOT DETECTED Final   Mycoplasma pneumoniae  NOT DETECTED NOT DETECTED Final    Comment: Performed at [facility], [address], [place], [place] [address]    Radiology Studies: DG Chest Port 1 View  Result Date: 01/18/2023 CLINICAL DATA:  Questionable sepsis EXAM: PORTABLE CHEST 1 VIEW COMPARISON:  Chest x-ray 12/30/2022 FINDINGS: The heart size and mediastinal contours are within normal limits. Both lungs are clear. The visualized skeletal structures are unremarkable. IMPRESSION: No active disease. Electronically Signed   By: Darliss Cheney M.D.   On: 01/18/2023 23:08  Scheduled Meds:  apixaban  5 mg Oral BID   atorvastatin  20 mg Oral Daily   dextromethorphan-guaiFENesin  1 tablet Oral BID   furosemide  40 mg Intravenous Q12H   insulin aspart  0-20 Units Subcutaneous TID WC   insulin aspart  0-5 Units Subcutaneous QHS   ipratropium-albuterol  3 mL Nebulization Q6H   loratadine  10 mg Oral Daily   metoprolol tartrate  50 mg Oral BID   potassium chloride  10 mEq Oral Daily   predniSONE  40 mg Oral Q breakfast   Continuous Infusions:  cefTRIAXone (ROCEPHIN)  IV Stopped (01/20/23 0246)     LOS: 1 day   Time spent:  Azucena Fallen, DO [facility]  If 7PM-7AM, please contact night-coverage [email or web address]  01/20/2023, 7:42 AM

## 2023-01-21 DIAGNOSIS — I4891 Unspecified atrial fibrillation: Secondary | ICD-10-CM

## 2023-01-21 LAB — GLUCOSE, CAPILLARY
Glucose-Capillary: 189 mg/dL — ABNORMAL HIGH (ref 70–99)
Glucose-Capillary: 265 mg/dL — ABNORMAL HIGH (ref 70–99)
Glucose-Capillary: 440 mg/dL — ABNORMAL HIGH (ref 70–99)
Glucose-Capillary: 404 mg/dL — ABNORMAL HIGH (ref 70–99)

## 2023-01-21 LAB — CBC
HCT: 40.5 % (ref 36.0–46.0)
Hemoglobin: 13.3 g/dL (ref 12.0–15.0)
MCH: 30.5 pg (ref 26.0–34.0)
MCHC: 32.8 g/dL (ref 30.0–36.0)
MCV: 92.9 fL (ref 80.0–100.0)
Platelets: 164 10*3/uL (ref 150–400)
RBC: 4.36 MIL/uL (ref 3.87–5.11)
RDW: 12.8 % (ref 11.5–15.5)
WBC: 8.7 10*3/uL (ref 4.0–10.5)
nRBC: 0 % (ref 0.0–0.2)

## 2023-01-21 LAB — BASIC METABOLIC PANEL
Anion gap: 9 (ref 5–15)
BUN: 32 mg/dL — ABNORMAL HIGH (ref 8–23)
CO2: 31 mmol/L (ref 22–32)
Calcium: 8.9 mg/dL (ref 8.9–10.3)
Chloride: 96 mmol/L — ABNORMAL LOW (ref 98–111)
Creatinine, Ser: 0.85 mg/dL (ref 0.44–1.00)
GFR, Estimated: >60 mL/min (ref >60)
Glucose, Bld: 188 mg/dL — ABNORMAL HIGH (ref 70–99)
Potassium: 3.5 mmol/L (ref 3.5–5.1)
Sodium: 136 mmol/L (ref 135–145)

## 2023-01-21 LAB — CULTURE, RESPIRATORY W GRAM STAIN

## 2023-01-21 MED ORDER — IPRATROPIUM-ALBUTEROL 0.5-2.5 (3) MG/3ML IN SOLN
3.0000 mL | RESPIRATORY_TRACT | Status: DC
  Administered 2023-01-21 – 2023-01-22 (×6): 3 mL via RESPIRATORY_TRACT
  Filled 2023-01-21 (×6): qty 3

## 2023-01-21 MED ORDER — METHYLPREDNISOLONE SODIUM SUCC 125 MG IJ SOLR
80.0000 mg | INTRAMUSCULAR | Status: DC
  Administered 2023-01-21: 80 mg via INTRAVENOUS
  Filled 2023-01-21: qty 2

## 2023-01-21 MED ORDER — ARFORMOTEROL TARTRATE 15 MCG/2ML IN NEBU
15.0000 ug | INHALATION_SOLUTION | Freq: Two times a day (BID) | RESPIRATORY_TRACT | Status: DC
  Administered 2023-01-21 – 2023-01-23 (×5): 15 ug via RESPIRATORY_TRACT
  Filled 2023-01-21 (×6): qty 2

## 2023-01-21 MED ORDER — NAPHAZOLINE-GLYCERIN 0.012-0.25 % OP SOLN: 1.0000 [drp] | Freq: Four times a day (QID) | OPHTHALMIC | Status: DC | PRN

## 2023-01-21 MED ORDER — INSULIN ASPART 100 UNIT/ML IJ SOLN
3.0000 [IU] | Freq: Three times a day (TID) | INTRAMUSCULAR | Status: DC
  Administered 2023-01-21 – 2023-01-23 (×5): 3 [IU] via SUBCUTANEOUS
  Filled 2023-01-21: qty 1

## 2023-01-21 MED ORDER — INSULIN GLARGINE-YFGN 100 UNIT/ML ~~LOC~~ SOLN
5.0000 [IU] | Freq: Every day | SUBCUTANEOUS | Status: DC
  Administered 2023-01-21: 5 [IU] via SUBCUTANEOUS
  Filled 2023-01-21 (×2): qty 0.05

## 2023-01-21 MED ORDER — BUDESONIDE 0.25 MG/2ML IN SUSP
0.2500 mg | Freq: Two times a day (BID) | RESPIRATORY_TRACT | Status: DC
  Administered 2023-01-21 – 2023-01-23 (×5): 0.25 mg via RESPIRATORY_TRACT
  Filled 2023-01-21 (×5): qty 2

## 2023-01-21 MED ORDER — METHYLPREDNISOLONE SODIUM SUCC 40 MG IJ SOLR: 40.0000 mg | Freq: Two times a day (BID) | INTRAMUSCULAR | Status: DC

## 2023-01-21 NOTE — Inpatient Diabetes Management (Addendum)
Inpatient Diabetes Program Recommendations  AACE/ADA: New Consensus Statement on Inpatient Glycemic Control (2015)  Target Ranges:  Prepandial:   less than 140 mg/dL      Peak postprandial:   less than 180 mg/dL (1-2 hours)      Critically ill patients:  140 - 180 mg/dL    Latest Reference Range & Units 05/18/22 03:28 01/19/23 14:05  Hemoglobin A1C 4.8 - 5.6 % 8.2 (H) 9.3 (H)  220 mg/dl  (H): Data is abnormally high  Latest Reference Range & Units 01/20/23 07:58 01/20/23 12:19 01/20/23 17:04 01/20/23 20:57  Glucose-Capillary 70 - 99 mg/dL 829 (H)  7 units Novolog  189 (H)  4 units Novolog  248 (H)  7 units Novolog  260 (H)  3 units Novolog   (H): Data is abnormally high  Latest Reference Range & Units 01/21/23 08:03  Glucose-Capillary 70 - 99 mg/dL 562 (H)  (H): Data is abnormally high   Admit Resp Failure due to COP/ CHF  History: DM2 (hospitalized from 4/23 to 4/25 with respiratory failure secondary to COPD and CHF)   Home DM Meds: Metformin 750 mg Daily   Current Orders: Novolog 0-20 units TID ac/hs     Of note, pt got 2 doses Solumedrol 5/13 Switched to Prednisone 40 mg Daily 5/14    MD- Please consider starting low dose Novolog Meal Coverage while pt getting Prednisone:  Novolog 3 units TID with meals HOLD if pt NPO HOLD if pt eats <50% meals  Should we increase pt's home Metformin XR to 500 mg BID given A1c is 9.3%?    Addendum 11:45am--Met w/ pt at bedside.  Pt told me she has been taking her Metformin XR 750 mg daily consistently.  Per pt report, she got Prednisone taper in Nov last year, Dec last year, and then was given Steroids in the hospital in April during admission.  Has CBG meter at home and checks QID.  Is open to taking more Metformin if needed to help with better glucose control.  We discussed how steroids affect CBGs and the importance of better CBG control at home.  Pt to also follow up with PCP after d/c (Dr. Zada Finders with [facility]).       --Will follow patient during hospitalization--  Ambrose Finland RN, MSN, CDCES Diabetes Coordinator Inpatient Glycemic Control Team Team Pager: [phone] (8a-5p)

## 2023-01-21 NOTE — Progress Notes (Signed)
Pt refusing removal of purewick at this time. Pt has been educated regarding the need to increase mobility. Physician and director have been notified. Will continue plan of care.

## 2023-01-21 NOTE — Progress Notes (Addendum)
PROGRESS NOTE    Madison [name]  UEA:[id number] DOB: [birth year] DOA: 01/18/2023 PCP: Dione Housekeeper, MD    Brief Narrative:  Madison [name] is a 72 y.o. female with medical history significant for COPD not previously on oxygen having just been placed on nocturnal oxygen (2L) during last hospitalization 2 weeks ago, diastolic HF, HTN, HLD, NIDDM, A fib on Xarelto, morbid obesity, hospitalized from 4/23 to 4/25 with respiratory failure secondary to COPD and CHF exacerbation initially requiring BiPAP who presents to the ED with similar episodes to last hospitalization as well as increased shortness of breath waking her up from sleep.  She admits to worsening edema requiring increased oxygen (4L) at home to maintain appropriate saturations and symptom management.  Presents to the ED BiPAP dependent via EMS with above complaints, hospitalist called for admission.    Assessment & Plan:   Principal Problem:   COPD with acute exacerbation (HCC) Active Problems:   Acute on chronic respiratory failure with hypoxia and hypercapnia (HCC)   Acute on chronic diastolic CHF (congestive heart failure) (HCC)   Paroxysmal atrial fibrillation (HCC)   Chronic anticoagulation   Essential hypertension   Diabetes mellitus without complication (HCC)   Obesity, Class III, BMI 40-49.9 (morbid obesity) (HCC)  Acute on chronic respiratory failure with hypoxia due to combination of COPD and CHF exacerbation: -Weaned off BiPAP, continue to wean oxygen as tolerated   COPD exacerbation -Continues to wheeze Plan: Placed back on IV steroids.  Continue aggressive bronchodilator regimen.  DuoNebs every 4 hours, Brovana twice daily, Pulmicort twice daily.  Wean oxygen as tolerated.    Acute on chronic diastolic CHF (congestive heart failure):  -05/26/2022 showed EF> 75%.  Repeat echo similar EF greater than 75% -Interestingly BNP is only mildly elevated Plan: -Lasix 40 mg bid by IV -Continue daily  weights, I's and O's, low-salt fluid restricted diet    Paroxysmal atrial fibrillation:  Chronic anticoagulation - Continue Eliquis and metoprolol   Essential hypertension -IV hydralazine as needed -Continue metoprolol -IV Lasix as above   GERD (gastroesophageal reflux disease) -Protonix   Diabetes mellitus without complication, uncontrolled -NovoLog 3 units 3 times daily with meals -Sliding scale insulin -A1c 9.3   HLD: -Lipitor   Obesity, Class III, BMI 40-49.9 (morbid obesity):  - Body weight 108.8 kg, BMI 46.84 - Complicating factor to overall prognosis and care   DVT prophylaxis: Apixaban Code Status: Full Family Communication: Daughter Santina Evans [phone] on 5/15 Disposition Plan: Status is: Inpatient Remains inpatient appropriate because: Concomitant CHF/COPD exacerbation.  Acute hypoxic respiratory failure secondary to above.   Level of care: Progressive  Consultants:  None  Procedures:  None  Antimicrobials: Ceftriaxone    Subjective: Seen and examined.  Resting in bed.  Still wheezing.  No pain complaints.  Objective: Vitals:   01/21/23 0744 01/21/23 0808 01/21/23 1235 01/21/23 1328  BP:  (!) 111/52 137/61   Pulse: 78 78 72 74  Resp: 18 18 20  (!) 24  Temp:  97.8 F (36.6 C) 97.8 F (36.6 C)   TempSrc:  Oral Oral   SpO2:  99% 93% 92%  Weight:      Height:        Intake/Output Summary (Last 24 hours) at 01/21/2023 1351 Last data filed at 01/21/2023 0356 Gross per 24 hour  Intake 820 ml  Output 1700 ml  Net -880 ml   Filed Weights   01/18/23 2248 01/21/23 0721  Weight: 102 kg 93.8 kg    Examination:  General exam: NAD Respiratory system: Diffuse wheezing bilaterally.  Normal work of breathing.  3 L Cardiovascular system: S1-S2, RRR, no murmurs, 2+ pitting edema Gastrointestinal system: Soft, NT/ND, normal bowel sounds Central nervous system: Alert and oriented. No focal neurological deficits. Extremities: Symmetric 5 x 5  power. Skin: No rashes, lesions or ulcers Psychiatry: Judgement and insight appear normal. Mood & affect appropriate.     Data Reviewed: I have personally reviewed following labs and imaging studies  CBC: Recent Labs  Lab 01/18/23 2259 01/21/23 0627  WBC 9.3 8.7  NEUTROABS 4.2  --   HGB 14.7 13.3  HCT 45.9 40.5  MCV 95.0 92.9  PLT 207 164   Basic Metabolic Panel: Recent Labs  Lab 01/18/23 2259 01/21/23 0627  NA 138 136  K 4.2 3.5  CL 97* 96*  CO2 30 31  GLUCOSE 171* 188*  BUN 23 32*  CREATININE 1.01* 0.85  CALCIUM 9.3 8.9   GFR: Estimated Creatinine Clearance: 60.3 mL/min (by C-G formula based on SCr of 0.85 mg/dL). Liver Function Tests: Recent Labs  Lab 01/18/23 2259  AST 36  ALT 51*  ALKPHOS 90  BILITOT 1.1  PROT 7.7  ALBUMIN 4.5   No results for input(s): "LIPASE", "AMYLASE" in the last 168 hours. No results for input(s): "AMMONIA" in the last 168 hours. Coagulation Profile: Recent Labs  Lab 01/18/23 2259  INR 1.3*   Cardiac Enzymes: No results for input(s): "CKTOTAL", "CKMB", "CKMBINDEX", "TROPONINI" in the last 168 hours. BNP (last 3 results) No results for input(s): "PROBNP" in the last 8760 hours. HbA1C: Recent Labs    01/19/23 1405  HGBA1C 9.3*   CBG: Recent Labs  Lab 01/20/23 1219 01/20/23 1704 01/20/23 2057 01/21/23 0803 01/21/23 1236  GLUCAP 189* 248* 260* 189* 265*   Lipid Profile: No results for input(s): "CHOL", "HDL", "LDLCALC", "TRIG", "CHOLHDL", "LDLDIRECT" in the last 72 hours. Thyroid Function Tests: No results for input(s): "TSH", "T4TOTAL", "FREET4", "T3FREE", "THYROIDAB" in the last 72 hours. Anemia Panel: No results for input(s): "VITAMINB12", "FOLATE", "FERRITIN", "TIBC", "IRON", "RETICCTPCT" in the last 72 hours. Sepsis Labs: Recent Labs  Lab 01/18/23 2259 01/19/23 0052  LATICACIDVEN 1.9 1.5    Recent Results (from the past 240 hour(s))  Blood culture (routine single)     Status: None (Preliminary  result)   Collection Time: 01/18/23 10:59 PM   Specimen: BLOOD  Result Value Ref Range Status   Specimen Description BLOOD BLOOD RIGHT ARM  Final   Special Requests   Final    BOTTLES DRAWN AEROBIC AND ANAEROBIC Blood Culture results may not be optimal due to an excessive volume of blood received in culture bottles   Culture   Final    NO GROWTH 3 DAYS Performed at [facility], [address], [place], [place] [address]    Report Status PENDING  Incomplete  Expectorated Sputum Assessment w Gram Stain, Rflx to Resp Cult     Status: None   Collection Time: 01/19/23  9:34 AM   Specimen: Sputum  Result Value Ref Range Status   Specimen Description SPUTUM  Final   Special Requests NONE  Final   Sputum evaluation   Final    THIS SPECIMEN IS ACCEPTABLE FOR SPUTUM CULTURE Performed at [facility], [address], [place], [place] [address]    Report Status 01/19/2023 FINAL  Final  Culture, Respiratory w Gram Stain     Status: None (Preliminary result)   Collection Time: 01/19/23  9:34 AM  Specimen: SPU  Result Value Ref Range Status   Specimen Description   Final    SPUTUM Performed at [facility], [address] [place], [place] [address]    Special Requests   Final    NONE Reflexed from [id number] Performed at [facility], [address] [place], [place] [address]    Gram Stain   Final    ABUNDANT WBC PRESENT, PREDOMINANTLY PMN RARE GRAM POSITIVE COCCI IN PAIRS    Culture   Final    MODERATE Normal respiratory flora-no Staph aureus or Pseudomonas seen Performed at [facility], [address] [place], [place] [address]    Report Status PENDING  Incomplete  SARS Coronavirus 2 by RT PCR (hospital order, performed in [facility] hospital lab) *cepheid single result test* Anterior Nasal Swab     Status: None   Collection Time: 01/19/23  1:56 PM   Specimen: Anterior Nasal Swab  Result Value Ref Range Status   SARS Coronavirus 2 by  RT PCR NEGATIVE NEGATIVE Final    Comment: (NOTE) SARS-CoV-2 target nucleic acids are NOT DETECTED.  The SARS-CoV-2 RNA is generally detectable in upper and lower respiratory specimens during the acute phase of infection. The lowest concentration of SARS-CoV-2 viral copies this assay can detect is 250 copies / mL. A negative result does not preclude SARS-CoV-2 infection and should not be used as the sole basis for treatment or other patient management decisions.  A negative result may occur with improper specimen collection / handling, submission of specimen other than nasopharyngeal swab, presence of viral mutation(s) within the areas targeted by this assay, and inadequate number of viral copies (<250 copies / mL). A negative result must be combined with clinical observations, patient history, and epidemiological information.  Fact Sheet for Patients:   RoadLapTop.co.za  Fact Sheet for Healthcare Providers: http://kim-miller.com/  This test is not yet approved or  cleared by the [place] FDA and has been authorized for detection and/or diagnosis of SARS-CoV-2 by FDA under an Emergency Use Authorization (EUA).  This EUA will remain in effect (meaning this test can be used) for the duration of the COVID-19 declaration under Section 564(b)(1) of the Act, 21 U.S.C. section 360bbb-3(b)(1), unless the authorization is terminated or revoked sooner.  Performed at [facility], [address] [place], [place] [address]   Respiratory (~20 pathogens) panel by PCR     Status: None   Collection Time: 01/19/23  1:56 PM   Specimen: Nasopharyngeal Swab; Respiratory  Result Value Ref Range Status   Adenovirus NOT DETECTED NOT DETECTED Final   Coronavirus 229E NOT DETECTED NOT DETECTED Final    Comment: (NOTE) The Coronavirus on the Respiratory Panel, DOES NOT test for the novel  Coronavirus (2019 nCoV)    Coronavirus HKU1 NOT  DETECTED NOT DETECTED Final   Coronavirus NL63 NOT DETECTED NOT DETECTED Final   Coronavirus OC43 NOT DETECTED NOT DETECTED Final   Metapneumovirus NOT DETECTED NOT DETECTED Final   Rhinovirus / Enterovirus NOT DETECTED NOT DETECTED Final   Influenza A NOT DETECTED NOT DETECTED Final   Influenza B NOT DETECTED NOT DETECTED Final   Parainfluenza Virus 1 NOT DETECTED NOT DETECTED Final   Parainfluenza Virus 2 NOT DETECTED NOT DETECTED Final   Parainfluenza Virus 3 NOT DETECTED NOT DETECTED Final   Parainfluenza Virus 4 NOT DETECTED NOT DETECTED Final   Respiratory Syncytial Virus NOT DETECTED NOT DETECTED Final   Bordetella pertussis NOT DETECTED NOT DETECTED Final  Bordetella Parapertussis NOT DETECTED NOT DETECTED Final   Chlamydophila pneumoniae NOT DETECTED NOT DETECTED Final   Mycoplasma pneumoniae NOT DETECTED NOT DETECTED Final    Comment: Performed at [facility], [address] [place], [place] [address]  MRSA Next Gen by PCR, Nasal     Status: None   Collection Time: 01/20/23  6:21 PM   Specimen: Nasal Mucosa; Nasal Swab  Result Value Ref Range Status   MRSA by PCR Next Gen NOT DETECTED NOT DETECTED Final    Comment: (NOTE) The GeneXpert MRSA Assay (FDA approved for NASAL specimens only), is one component of a comprehensive MRSA colonization surveillance program. It is not intended to diagnose MRSA infection nor to guide or monitor treatment for MRSA infections. Test performance is not FDA approved in patients less than 53 years old. Performed at [facility], [address], [place], [place] [address]          Radiology Studies: ECHOCARDIOGRAM COMPLETE  Result Date: 01/20/2023    ECHOCARDIOGRAM REPORT   Patient Name:   TYNETTA MENZ [facility] Date of Exam: 01/19/2023 Medical Rec #:  [id number]         Height:       60.0 in Accession #:    [id number]        Weight:       224.9 lb Date of Birth:  08-31-1951         BSA:          1.962 m Patient Age:    71  years          BP:           123/71 mmHg Patient Gender: F                 HR:           117 bpm. Exam Location:  [facility] Procedure: 2D Echo, Cardiac Doppler and Color Doppler Indications:     I50.31 Acute Diastolic CHF  History:         Patient has prior history of Echocardiogram examinations, most                  recent 05/18/2022. Arrythmias:Tachycardia, Signs/Symptoms:Edema;                  Risk Factors:Dyslipidemia.  Sonographer:     Daphine Deutscher RDCS Referring Phys:  [id number] Andris Baumann Diagnosing Phys: Yvonne Kendall MD  Sonographer Comments: Technically difficult study due to poor echo windows. IMPRESSIONS  1. Intracavitary gradient up to 68 mmHg. Left ventricular ejection fraction, by estimation, is >75%. The left ventricle has hyperdynamic function. Left ventricular endocardial border not optimally defined to evaluate regional wall motion. Left ventricular diastolic parameters are consistent with Grade I diastolic dysfunction (impaired relaxation). Elevated left atrial pressure.  2. Right ventricular systolic function is normal. The right ventricular size is normal.  3. The mitral valve is grossly normal. No evidence of mitral valve regurgitation. No evidence of mitral stenosis.  4. The aortic valve was not well visualized. Aortic valve regurgitation is not visualized. Elevated LVOT gradient appreciated, though limited views of the valve do not suggest significant stenosis. FINDINGS  Left Ventricle: Intracavitary gradient up to 68 mmHg. Left ventricular ejection fraction, by estimation, is >75%. The left ventricle has hyperdynamic function. Left ventricular endocardial border not optimally defined to evaluate regional wall motion. The left ventricular internal cavity size was normal in size. There is no left ventricular hypertrophy. Left  ventricular diastolic parameters are consistent with Grade I diastolic dysfunction (impaired relaxation). Elevated left atrial pressure. Right Ventricle: The  right ventricular size is normal. No increase in right ventricular wall thickness. Right ventricular systolic function is normal. Left Atrium: Left atrial size was normal in size. Right Atrium: Right atrial size was normal in size. Pericardium: There is no evidence of pericardial effusion. Mitral Valve: The mitral valve is grossly normal. There is mild thickening of the mitral valve leaflet(s). No evidence of mitral valve regurgitation. No evidence of mitral valve stenosis. Tricuspid Valve: The tricuspid valve is not well visualized. Tricuspid valve regurgitation is not demonstrated. Aortic Valve: The aortic valve was not well visualized. Aortic valve regurgitation is not visualized. Elevated LVOT gradient appreciated, though limited views of the valve do not suggest significant stenosis. Pulmonic Valve: The pulmonic valve was not well visualized. Pulmonic valve regurgitation is not visualized. No evidence of pulmonic stenosis. Aorta: The aortic root is normal in size and structure. Pulmonary Artery: The pulmonary artery is not well seen. Venous: The inferior vena cava was not well visualized. IAS/Shunts: The interatrial septum was not well visualized.  LEFT VENTRICLE PLAX 2D LVIDd:         5.00 cm   Diastology LVIDs:         3.10 cm   LV e' medial:    4.68 cm/s LV PW:         0.70 cm   LV E/e' medial:  19.6 LV IVS:        0.70 cm   LV e' lateral:   6.10 cm/s LVOT diam:     1.80 cm   LV E/e' lateral: 15.0 LVOT Area:     2.54 cm  RIGHT VENTRICLE             IVC RV Basal diam:  2.90 cm     IVC diam: 1.90 cm RV S prime:     16.18 cm/s LEFT ATRIUM             Index        RIGHT ATRIUM           Index LA diam:        4.20 cm 2.14 cm/m   RA Area:     11.50 cm LA Vol (A2C):   38.5 ml 19.62 ml/m  RA Volume:   28.60 ml  14.58 ml/m LA Vol (A4C):   31.9 ml 16.26 ml/m LA Biplane Vol: 38.8 ml 19.78 ml/m   AORTA Ao Root diam: 2.70 cm MITRAL VALVE MV Area (PHT): 4.68 cm     SHUNTS MV Decel Time: 162 msec     Systemic Diam:  1.80 cm MV E velocity: 91.70 cm/s MV A velocity: 131.00 cm/s MV E/A ratio:  0.70 Christopher End MD Electronically signed by Yvonne Kendall MD Signature Date/Time: 01/20/2023/10:31:42 AM    Final         Scheduled Meds:  apixaban  5 mg Oral BID   arformoterol  15 mcg Nebulization BID   atorvastatin  20 mg Oral Daily   budesonide (PULMICORT) nebulizer solution  0.25 mg Nebulization BID   dextromethorphan-guaiFENesin  1 tablet Oral BID   furosemide  40 mg Intravenous Q12H   insulin aspart  0-20 Units Subcutaneous TID WC   insulin aspart  0-5 Units Subcutaneous QHS   ipratropium-albuterol  3 mL Nebulization Q4H   loratadine  10 mg Oral Daily   methylPREDNISolone (SOLU-MEDROL) injection  80 mg Intravenous Q24H  metoprolol tartrate  50 mg Oral BID   potassium chloride  10 mEq Oral Daily   Continuous Infusions:  cefTRIAXone (ROCEPHIN)  IV Stopped (01/21/23 0158)     LOS: 2 days     Tresa Moore, MD [facility]   If 7PM-7AM, please contact night-coverage  01/21/2023, 1:51 PM

## 2023-01-21 NOTE — Progress Notes (Signed)
PHARMACIST - PHYSICIAN COMMUNICATION   CONCERNING: Methylprednisolone IV    Current order: Methylprednisolone IV 40 every 12 hours     DESCRIPTION: Per  Protocol:   IV methylprednisolone will be converted to either a q12h or q24h frequency with the same total daily dose (TDD).  Ordered Dose: 1 to 125 mg TDD; convert to: TDD q24h.  Ordered Dose: 126 to 250 mg TDD; convert to: TDD div q12h.  Ordered Dose: >250 mg TDD; DAW.  Order has been adjusted to: Methylprednisolone IV 80mg  every 24 hours   Gardner Candle , PharmD, BCPS Clinical Pharmacist  01/21/2023 11:22 AM

## 2023-01-21 NOTE — Plan of Care (Addendum)
Pt was educated about living better with Heart Failure. Heart failure booklet was hand out to the pt. No concerns/question at this time.  Will continue to monitor.   Problem: Education: Goal: Knowledge of disease or condition will improve Outcome: Progressing   Problem: Respiratory: Goal: Ability to maintain a clear airway will improve Outcome: Progressing   Problem: Clinical Measurements: Goal: Cardiovascular complication will be avoided Outcome: Progressing   Problem: Pain Managment: Goal: General experience of comfort will improve Outcome: Progressing   Problem: Safety: Goal: Ability to remain free from injury will improve Outcome: Progressing

## 2023-01-22 LAB — CBC
HCT: 43.8 % (ref 36.0–46.0)
Hemoglobin: 14.5 g/dL (ref 12.0–15.0)
MCH: 30.2 pg (ref 26.0–34.0)
MCHC: 33.1 g/dL (ref 30.0–36.0)
MCV: 91.3 fL (ref 80.0–100.0)
Platelets: 188 10*3/uL (ref 150–400)
RBC: 4.8 MIL/uL (ref 3.87–5.11)
RDW: 12.2 % (ref 11.5–15.5)
WBC: 11 10*3/uL — ABNORMAL HIGH (ref 4.0–10.5)
nRBC: 0 % (ref 0.0–0.2)

## 2023-01-22 LAB — GLUCOSE, CAPILLARY
Glucose-Capillary: 237 mg/dL — ABNORMAL HIGH (ref 70–99)
Glucose-Capillary: 300 mg/dL — ABNORMAL HIGH (ref 70–99)
Glucose-Capillary: 124 mg/dL — ABNORMAL HIGH (ref 70–99)
Glucose-Capillary: 341 mg/dL — ABNORMAL HIGH (ref 70–99)

## 2023-01-22 LAB — BASIC METABOLIC PANEL
Anion gap: 12 (ref 5–15)
BUN: 30 mg/dL — ABNORMAL HIGH (ref 8–23)
CO2: 29 mmol/L (ref 22–32)
Calcium: 9.2 mg/dL (ref 8.9–10.3)
Chloride: 94 mmol/L — ABNORMAL LOW (ref 98–111)
Creatinine, Ser: 0.88 mg/dL (ref 0.44–1.00)
GFR, Estimated: >60 mL/min (ref >60)
Glucose, Bld: 208 mg/dL — ABNORMAL HIGH (ref 70–99)
Potassium: 4 mmol/L (ref 3.5–5.1)
Sodium: 135 mmol/L (ref 135–145)

## 2023-01-22 MED ORDER — METHYLPREDNISOLONE SODIUM SUCC 40 MG IJ SOLR
40.0000 mg | INTRAMUSCULAR | Status: DC
  Administered 2023-01-22: 40 mg via INTRAVENOUS
  Filled 2023-01-22: qty 1

## 2023-01-22 MED ORDER — INSULIN GLARGINE-YFGN 100 UNIT/ML ~~LOC~~ SOLN
10.0000 [IU] | Freq: Every day | SUBCUTANEOUS | Status: DC
  Administered 2023-01-22: 10 [IU] via SUBCUTANEOUS
  Filled 2023-01-22 (×2): qty 0.1

## 2023-01-22 MED ORDER — IPRATROPIUM-ALBUTEROL 0.5-2.5 (3) MG/3ML IN SOLN
3.0000 mL | Freq: Four times a day (QID) | RESPIRATORY_TRACT | Status: DC
  Administered 2023-01-22 – 2023-01-23 (×4): 3 mL via RESPIRATORY_TRACT
  Filled 2023-01-22 (×4): qty 3

## 2023-01-22 NOTE — Inpatient Diabetes Management (Signed)
Inpatient Diabetes Program Recommendations  AACE/ADA: New Consensus Statement on Inpatient Glycemic Control (2015)  Target Ranges:  Prepandial:   less than 140 mg/dL      Peak postprandial:   less than 180 mg/dL (1-2 hours)      Critically ill patients:  140 - 180 mg/dL   Lab Results  Component Value Date   GLUCAP 300 (H) 01/22/2023   HGBA1C 9.3 (H) 01/19/2023    Review of Glycemic Control  Latest Reference Range & Units 01/21/23 08:03 01/21/23 12:36 01/21/23 17:42 01/21/23 21:13 01/22/23 08:23 01/22/23 11:33  Glucose-Capillary 70 - 99 mg/dL 098 (H) 119 (H) 147 (H) 404 (H) 237 (H) 300 (H)  (H): Data is abnormally high  Diabetes history: DM2 Outpatient Diabetes medications: Metformin 750 mg QD Current orders for Inpatient glycemic control: Semglee 5 units QD, Novolog 0-20 units TID and 0-5 units QHS, Novolog 3 units TID  Inpatient Diabetes Program Recommendations:    Might consider:  1-Semglee 10 units QHS 2-Novolog 5 units TID with meals if she consumes at least 50% (while on steroids)  Will continue to follow while inpatient.  Thank you, Dulce Sellar, MSN, CDCES Diabetes Coordinator Inpatient Diabetes Program [phone] (team pager from 8a-5p)

## 2023-01-22 NOTE — Progress Notes (Signed)
PROGRESS NOTE    Madison [name]  ZOX:[id number] DOB: Jan 30, 1951 DOA: 01/18/2023 PCP: Dione Housekeeper, MD    Brief Narrative:  Madison [name] is a 72 y.o. female with medical history significant for COPD not previously on oxygen having just been placed on nocturnal oxygen (2L) during last hospitalization 2 weeks ago, diastolic HF, HTN, HLD, NIDDM, A fib on Xarelto, morbid obesity, hospitalized from 4/23 to 4/25 with respiratory failure secondary to COPD and CHF exacerbation initially requiring BiPAP who presents to the ED with similar episodes to last hospitalization as well as increased shortness of breath waking her up from sleep.  She admits to worsening edema requiring increased oxygen (4L) at home to maintain appropriate saturations and symptom management.  Presents to the ED BiPAP dependent via EMS with above complaints, hospitalist called for admission.    Assessment & Plan:   Principal Problem:   COPD with acute exacerbation (HCC) Active Problems:   Acute on chronic respiratory failure with hypoxia and hypercapnia (HCC)   Acute on chronic diastolic CHF (congestive heart failure) (HCC)   Paroxysmal atrial fibrillation (HCC)   Chronic anticoagulation   Essential hypertension   Diabetes mellitus without complication (HCC)   Obesity, Class III, BMI 40-49.9 (morbid obesity) (HCC)  Acute on chronic respiratory failure with hypoxia due to combination of COPD and CHF exacerbation: -Weaned off BiPAP, continue to wean oxygen as tolerated   COPD exacerbation -Wheezing much improved Plan: Wean IV steroids.  Continue aggressive bronchodilator regimen DuoNebs every 4 hours, Brovana twice daily, Pulmicort twice daily.  Wean oxygen as tolerated.    Acute on chronic diastolic CHF (congestive heart failure):  -05/26/2022 showed EF> 75%.  Repeat echo similar EF greater than 75% -Interestingly BNP is only mildly elevated Plan: -Continue Lasix 40 mg IV twice daily for today.   Consider de-escalation to oral Lasix tomorrow -Continue daily weights, I's and O's, low-salt fluid restricted diet    Paroxysmal atrial fibrillation:  Chronic anticoagulation - Continue Eliquis and metoprolol   Essential hypertension -IV hydralazine as needed -Continue metoprolol -IV Lasix as above   GERD (gastroesophageal reflux disease) -Protonix   Diabetes mellitus without complication, uncontrolled -NovoLog 3 units 3 times daily with meals -Sliding scale insulin -A1c 9.3   HLD: -Lipitor   Obesity, Class III, BMI 40-49.9 (morbid obesity):  - Body weight 108.8 kg, BMI 46.84 - Complicating factor to overall prognosis and care   DVT prophylaxis: Apixaban Code Status: Full Family Communication: Daughter Santina Evans [phone] on 5/15 Disposition Plan: Status is: Inpatient Remains inpatient appropriate because: Concomitant CHF/COPD exacerbation.  Acute hypoxic respiratory failure secondary to above.   Level of care: Progressive  Consultants:  None  Procedures:  None  Antimicrobials: Ceftriaxone    Subjective: Seen and examined.  Resting in bed.  Wheezing improved.  Feels better  Objective: Vitals:   01/22/23 0822 01/22/23 0834 01/22/23 0900 01/22/23 1133  BP: (!) 130/56   125/61  Pulse: 79   70  Resp: 18   18  Temp: 98.1 F (36.7 C)     TempSrc:      SpO2: 99% 98%  97%  Weight:   97.4 kg   Height:        Intake/Output Summary (Last 24 hours) at 01/22/2023 1320 Last data filed at 01/22/2023 1019 Gross per 24 hour  Intake 200 ml  Output 2050 ml  Net -1850 ml   Filed Weights   01/18/23 2248 01/21/23 0721 01/22/23 0900  Weight: 102 kg 93.8 kg  97.4 kg    Examination:  General exam: No acute distress Respiratory system: Decreased air entry bilaterally.  Mild end expiratory wheeze.  Normal work of breathing.  2 L Cardiovascular system: S1-S2, RRR, no murmurs, 1+ + pitting edema Gastrointestinal system: Soft, NT/ND, normal bowel sounds Central  nervous system: Alert and oriented. No focal neurological deficits. Extremities: Symmetric 5 x 5 power. Skin: No rashes, lesions or ulcers Psychiatry: Judgement and insight appear normal. Mood & affect appropriate.     Data Reviewed: I have personally reviewed following labs and imaging studies  CBC: Recent Labs  Lab 01/18/23 2259 01/21/23 0627 01/22/23 0610  WBC 9.3 8.7 11.0*  NEUTROABS 4.2  --   --   HGB 14.7 13.3 14.5  HCT 45.9 40.5 43.8  MCV 95.0 92.9 91.3  PLT 207 164 188   Basic Metabolic Panel: Recent Labs  Lab 01/18/23 2259 01/21/23 0627 01/22/23 0610  NA 138 136 135  K 4.2 3.5 4.0  CL 97* 96* 94*  CO2 30 31 29   GLUCOSE 171* 188* 208*  BUN 23 32* 30*  CREATININE 1.01* 0.85 0.88  CALCIUM 9.3 8.9 9.2   GFR: Estimated Creatinine Clearance: 59.6 mL/min (by C-G formula based on SCr of 0.88 mg/dL). Liver Function Tests: Recent Labs  Lab 01/18/23 2259  AST 36  ALT 51*  ALKPHOS 90  BILITOT 1.1  PROT 7.7  ALBUMIN 4.5   No results for input(s): "LIPASE", "AMYLASE" in the last 168 hours. No results for input(s): "AMMONIA" in the last 168 hours. Coagulation Profile: Recent Labs  Lab 01/18/23 2259  INR 1.3*   Cardiac Enzymes: No results for input(s): "CKTOTAL", "CKMB", "CKMBINDEX", "TROPONINI" in the last 168 hours. BNP (last 3 results) No results for input(s): "PROBNP" in the last 8760 hours. HbA1C: Recent Labs    01/19/23 1405  HGBA1C 9.3*   CBG: Recent Labs  Lab 01/21/23 1236 01/21/23 1742 01/21/23 2113 01/22/23 0823 01/22/23 1133  GLUCAP 265* 440* 404* 237* 300*   Lipid Profile: No results for input(s): "CHOL", "HDL", "LDLCALC", "TRIG", "CHOLHDL", "LDLDIRECT" in the last 72 hours. Thyroid Function Tests: No results for input(s): "TSH", "T4TOTAL", "FREET4", "T3FREE", "THYROIDAB" in the last 72 hours. Anemia Panel: No results for input(s): "VITAMINB12", "FOLATE", "FERRITIN", "TIBC", "IRON", "RETICCTPCT" in the last 72 hours. Sepsis  Labs: Recent Labs  Lab 01/18/23 2259 01/19/23 0052  LATICACIDVEN 1.9 1.5    Recent Results (from the past 240 hour(s))  Blood culture (routine single)     Status: None (Preliminary result)   Collection Time: 01/18/23 10:59 PM   Specimen: BLOOD  Result Value Ref Range Status   Specimen Description BLOOD BLOOD RIGHT ARM  Final   Special Requests   Final    BOTTLES DRAWN AEROBIC AND ANAEROBIC Blood Culture results may not be optimal due to an excessive volume of blood received in culture bottles   Culture   Final    NO GROWTH 4 DAYS Performed at [facility], [address], [place], [place] [address]    Report Status PENDING  Incomplete  Expectorated Sputum Assessment w Gram Stain, Rflx to Resp Cult     Status: None   Collection Time: 01/19/23  9:34 AM   Specimen: Sputum  Result Value Ref Range Status   Specimen Description SPUTUM  Final   Special Requests NONE  Final   Sputum evaluation   Final    THIS SPECIMEN IS ACCEPTABLE FOR SPUTUM CULTURE Performed at [facility], [address]  [place], [place] [address]    Report Status 01/19/2023 FINAL  Final  Culture, Respiratory w Gram Stain     Status: None   Collection Time: 01/19/23  9:34 AM   Specimen: SPU  Result Value Ref Range Status   Specimen Description   Final    SPUTUM Performed at [facility], [address] [place], [place] [address]    Special Requests   Final    NONE Reflexed from [id number] Performed at [facility], [address] [place], [place] [address]    Gram Stain   Final    ABUNDANT WBC PRESENT, PREDOMINANTLY PMN RARE GRAM POSITIVE COCCI IN PAIRS    Culture   Final    MODERATE Normal respiratory flora-no Staph aureus or Pseudomonas seen Performed at [facility], [address] [place], [place] [address]    Report Status 01/21/2023 FINAL  Final  SARS Coronavirus 2 by RT PCR (hospital order, performed in [facility] hospital lab) *cepheid single  result test* Anterior Nasal Swab     Status: None   Collection Time: 01/19/23  1:56 PM   Specimen: Anterior Nasal Swab  Result Value Ref Range Status   SARS Coronavirus 2 by RT PCR NEGATIVE NEGATIVE Final    Comment: (NOTE) SARS-CoV-2 target nucleic acids are NOT DETECTED.  The SARS-CoV-2 RNA is generally detectable in upper and lower respiratory specimens during the acute phase of infection. The lowest concentration of SARS-CoV-2 viral copies this assay can detect is 250 copies / mL. A negative result does not preclude SARS-CoV-2 infection and should not be used as the sole basis for treatment or other patient management decisions.  A negative result may occur with improper specimen collection / handling, submission of specimen other than nasopharyngeal swab, presence of viral mutation(s) within the areas targeted by this assay, and inadequate number of viral copies (<250 copies / mL). A negative result must be combined with clinical observations, patient history, and epidemiological information.  Fact Sheet for Patients:   RoadLapTop.co.za  Fact Sheet for Healthcare Providers: http://kim-miller.com/  This test is not yet approved or  cleared by the [place] FDA and has been authorized for detection and/or diagnosis of SARS-CoV-2 by FDA under an Emergency Use Authorization (EUA).  This EUA will remain in effect (meaning this test can be used) for the duration of the COVID-19 declaration under Section 564(b)(1) of the Act, 21 U.S.C. section 360bbb-3(b)(1), unless the authorization is terminated or revoked sooner.  Performed at [facility], [address] [place], [place] [address]   Respiratory (~20 pathogens) panel by PCR     Status: None   Collection Time: 01/19/23  1:56 PM   Specimen: Nasopharyngeal Swab; Respiratory  Result Value Ref Range Status   Adenovirus NOT DETECTED NOT DETECTED Final   Coronavirus 229E  NOT DETECTED NOT DETECTED Final    Comment: (NOTE) The Coronavirus on the Respiratory Panel, DOES NOT test for the novel  Coronavirus (2019 nCoV)    Coronavirus HKU1 NOT DETECTED NOT DETECTED Final   Coronavirus NL63 NOT DETECTED NOT DETECTED Final   Coronavirus OC43 NOT DETECTED NOT DETECTED Final   Metapneumovirus NOT DETECTED NOT DETECTED Final   Rhinovirus / Enterovirus NOT DETECTED NOT DETECTED Final   Influenza A NOT DETECTED NOT DETECTED Final   Influenza B NOT DETECTED NOT DETECTED Final   Parainfluenza Virus 1 NOT DETECTED NOT DETECTED Final   Parainfluenza Virus 2 NOT DETECTED NOT DETECTED Final   Parainfluenza Virus  3 NOT DETECTED NOT DETECTED Final   Parainfluenza Virus 4 NOT DETECTED NOT DETECTED Final   Respiratory Syncytial Virus NOT DETECTED NOT DETECTED Final   Bordetella pertussis NOT DETECTED NOT DETECTED Final   Bordetella Parapertussis NOT DETECTED NOT DETECTED Final   Chlamydophila pneumoniae NOT DETECTED NOT DETECTED Final   Mycoplasma pneumoniae NOT DETECTED NOT DETECTED Final    Comment: Performed at [facility], [address], [place], [place] [address]  MRSA Next Gen by PCR, Nasal     Status: None   Collection Time: 01/20/23  6:21 PM   Specimen: Nasal Mucosa; Nasal Swab  Result Value Ref Range Status   MRSA by PCR Next Gen NOT DETECTED NOT DETECTED Final    Comment: (NOTE) The GeneXpert MRSA Assay (FDA approved for NASAL specimens only), is one component of a comprehensive MRSA colonization surveillance program. It is not intended to diagnose MRSA infection nor to guide or monitor treatment for MRSA infections. Test performance is not FDA approved in patients less than 49 years old. Performed at [facility], [address] [place], [place] [address]          Radiology Studies: No results found.      Scheduled Meds:  apixaban  5 mg Oral BID   arformoterol  15 mcg Nebulization BID   atorvastatin  20 mg Oral Daily    budesonide (PULMICORT) nebulizer solution  0.25 mg Nebulization BID   dextromethorphan-guaiFENesin  1 tablet Oral BID   furosemide  40 mg Intravenous Q12H   insulin aspart  0-20 Units Subcutaneous TID WC   insulin aspart  0-5 Units Subcutaneous QHS   insulin aspart  3 Units Subcutaneous TID WC   insulin glargine-yfgn  10 Units Subcutaneous QHS   ipratropium-albuterol  3 mL Nebulization Q6H   loratadine  10 mg Oral Daily   methylPREDNISolone (SOLU-MEDROL) injection  40 mg Intravenous Q24H   metoprolol tartrate  50 mg Oral BID   potassium chloride  10 mEq Oral Daily   Continuous Infusions:  cefTRIAXone (ROCEPHIN)  IV 1 g (01/22/23 0141)     LOS: 3 days     Tresa Moore, MD [facility]   If 7PM-7AM, please contact night-coverage  01/22/2023, 1:20 PM

## 2023-01-22 NOTE — Care Management Important Message (Signed)
Important Message  Patient Details  Name: Madison [name] MRN: [id number] Date of Birth: [birth year]   Medicare Important Message Given:  Yes     Johnell Comings 01/22/2023, 1:38 PM

## 2023-01-23 ENCOUNTER — Other Ambulatory Visit (HOSPITAL_COMMUNITY): Payer: Self-pay

## 2023-01-23 ENCOUNTER — Other Ambulatory Visit: Payer: Self-pay

## 2023-01-23 LAB — BASIC METABOLIC PANEL
Anion gap: 11 (ref 5–15)
BUN: 33 mg/dL — ABNORMAL HIGH (ref 8–23)
CO2: 32 mmol/L (ref 22–32)
Calcium: 9.4 mg/dL (ref 8.9–10.3)
Chloride: 97 mmol/L — ABNORMAL LOW (ref 98–111)
Creatinine, Ser: 0.97 mg/dL (ref 0.44–1.00)
GFR, Estimated: >60 mL/min (ref >60)
Glucose, Bld: 193 mg/dL — ABNORMAL HIGH (ref 70–99)
Potassium: 3.7 mmol/L (ref 3.5–5.1)
Sodium: 140 mmol/L (ref 135–145)

## 2023-01-23 LAB — CBC
HCT: 42.8 % (ref 36.0–46.0)
Hemoglobin: 14.2 g/dL (ref 12.0–15.0)
MCH: 30.1 pg (ref 26.0–34.0)
MCHC: 33.2 g/dL (ref 30.0–36.0)
MCV: 90.9 fL (ref 80.0–100.0)
Platelets: 185 10*3/uL (ref 150–400)
RBC: 4.71 MIL/uL (ref 3.87–5.11)
RDW: 12.2 % (ref 11.5–15.5)
WBC: 11.9 10*3/uL — ABNORMAL HIGH (ref 4.0–10.5)
nRBC: 0 % (ref 0.0–0.2)

## 2023-01-23 LAB — CULTURE, BLOOD (SINGLE)

## 2023-01-23 LAB — GLUCOSE, CAPILLARY: Glucose-Capillary: 170 mg/dL — ABNORMAL HIGH (ref 70–99)

## 2023-01-23 MED ORDER — PREDNISONE 50 MG PO TABS
60.0000 mg | ORAL_TABLET | Freq: Every day | ORAL | Status: DC
  Administered 2023-01-23: 60 mg via ORAL
  Filled 2023-01-23: qty 1

## 2023-01-23 MED ORDER — FUROSEMIDE 40 MG PO TABS: 40.0000 mg | ORAL_TABLET | Freq: Every day | ORAL | 0 refills | Status: AC

## 2023-01-23 MED ORDER — LORAZEPAM 0.5 MG PO TABS: 0.5000 mg | ORAL_TABLET | Freq: Four times a day (QID) | ORAL | 0 refills | Status: DC | PRN

## 2023-01-23 MED ORDER — PREDNISONE 20 MG PO TABS: 40.0000 mg | ORAL_TABLET | Freq: Every day | ORAL | Status: DC

## 2023-01-23 MED ORDER — PREDNISONE 10 MG PO TABS: ORAL_TABLET | ORAL | 0 refills | Status: AC

## 2023-01-23 NOTE — Discharge Summary (Signed)
Physician Discharge Summary  [name] ZOX:[id number] DOB: 10/27/1950 DOA: 01/18/2023  PCP: Madison Housekeeper, MD  Admit date: 01/18/2023 Discharge date: 01/23/2023  Admitted From: Home Disposition:  Home  Recommendations for Outpatient Follow-up:  Follow up with PCP in 1-2 weeks   Home Health:No Equipment/Devices:None   Discharge Condition:Stable  CODE STATUS:FULL  Diet recommendation: Carb mod  Brief/Interim Summary: Madison [name] is a 72 y.o. female with medical history significant for COPD not previously on oxygen having just been placed on nocturnal oxygen (2L) during last hospitalization 2 weeks ago, diastolic HF, HTN, HLD, NIDDM, A fib on Xarelto, morbid obesity, hospitalized from 4/23 to 4/25 with respiratory failure secondary to COPD and CHF exacerbation initially requiring BiPAP who presents to the ED with similar episodes to last hospitalization as well as increased shortness of breath waking her up from sleep.  She admits to worsening edema requiring increased oxygen (4L) at home to maintain appropriate saturations and symptom management.  Presents to the ED BiPAP dependent via EMS with above complaints, hospitalist called for admission.   Treated with IV diuretic, bronchodilators, steroids.  Respiratory status improved.  Back to baseline at time of discharge.    Discharge Diagnoses:  Principal Problem:   COPD with acute exacerbation (HCC) Active Problems:   Acute on chronic respiratory failure with hypoxia and hypercapnia (HCC)   Acute on chronic diastolic CHF (congestive heart failure) (HCC)   Paroxysmal atrial fibrillation (HCC)   Chronic anticoagulation   Essential hypertension   Diabetes mellitus without complication (HCC)   Obesity, Class III, BMI 40-49.9 (morbid obesity) (HCC)  Acute on chronic respiratory failure with hypoxia due to combination of COPD and CHF exacerbation: -Weaned off BiPAP, back on baseline.  Room air at daytime.  2 L at  night  COPD exacerbation -Wheezing much improved Plan: Can resume home bronchodilator regimen.  10-day p.o. prednisone taper ordered     Acute on chronic diastolic CHF (congestive heart failure):  -05/26/2022 showed EF> 75%.  Repeat echo similar EF greater than 75% -Interestingly BNP is only mildly elevated Plan: Transition back to oral diuretic Lasix 40 mg daily at time of discharge  Paroxysmal atrial fibrillation:  Chronic anticoagulation - Continue Eliquis and metoprolol   Essential hypertension Continue home to follow   GERD (gastroesophageal reflux disease) -Protonix   Diabetes mellitus without complication, uncontrolled Can resume home regimen   HLD: -Lipitor   Obesity, Class III, BMI 40-49.9 (morbid obesity):  - Body weight 108.8 kg, BMI 46.84 - Complicating factor to overall prognosis and care  Discharge Instructions  Discharge Instructions     Diet - low sodium heart healthy   Complete by: As directed    Increase activity slowly   Complete by: As directed       Allergies as of 01/23/2023       Reactions   Egg-derived Products Anaphylaxis   Egg albumin   Fentanyl Anaphylaxis   Morphine And Codeine    unknown   Spiriva Handihaler [tiotropium Bromide Monohydrate] Other (See Comments)   bronchiospasms   Sulfa Antibiotics Hives   Penicillins Rash   Has patient had a PCN reaction causing immediate rash, facial/tongue/throat swelling, SOB or lightheadedness with hypotension: YES Has patient had a PCN reaction causing severe rash involving mucus membranes or skin necrosis: NO Has patient had a PCN reaction that required hospitalization NO Has patient had a PCN reaction occurring within the last 10 years: NO If all of the above answers are "NO", then may  proceed with Cephalosporin use.   Voltaren [diclofenac] Rash        Medication List     STOP taking these medications    fluticasone furoate-vilanterol 200-25 MCG/ACT Aepb Commonly known as: BREO  ELLIPTA   ipratropium 0.02 % nebulizer solution Commonly known as: ATROVENT   levalbuterol 0.63 MG/3ML nebulizer solution Commonly known as: XOPENEX       TAKE these medications    acetaminophen 500 MG tablet Commonly known as: TYLENOL Take 1,000 mg by mouth every 6 (six) hours as needed for mild pain.   albuterol 108 (90 Base) MCG/ACT inhaler Commonly known as: VENTOLIN HFA Inhale 2 puffs into the lungs every 4 (four) hours as needed for wheezing or shortness of breath.   atorvastatin 20 MG tablet Commonly known as: LIPITOR Take 20 mg by mouth daily.   Cholecalciferol 25 MCG (1000 UT) capsule Take 1,000 Units by mouth daily.   cyanocobalamin 500 MCG tablet Commonly known as: VITAMIN B12 Take 500 mcg by mouth daily.   dextromethorphan-guaiFENesin 30-600 MG 12hr tablet Commonly known as: MUCINEX DM Take 1 tablet by mouth 2 (two) times daily.   Eliquis 5 MG Tabs tablet Generic drug: apixaban Take 1 tablet (5 mg total) by mouth 2 (two) times daily.   famotidine 20 MG tablet Commonly known as: PEPCID Take 20 mg by mouth daily.   fluticasone 50 MCG/ACT nasal spray Commonly known as: FLONASE Place 2 sprays into both nostrils daily.   furosemide 40 MG tablet Commonly known as: LASIX Take 1 tablet (40 mg total) by mouth daily.   ipratropium-albuterol 0.5-2.5 (3) MG/3ML Soln Commonly known as: DUONEB Take 3 mLs by nebulization every 6 (six) hours as needed (Shortness of breath and wheezing).   loratadine-pseudoephedrine 10-240 MG 24 hr tablet Commonly known as: CLARITIN-D 24-hour Take 1 tablet by mouth daily.   LORazepam 0.5 MG tablet Commonly known as: ATIVAN Take 1 tablet (0.5 mg total) by mouth every 6 (six) hours as needed for anxiety.   metFORMIN 750 MG 24 hr tablet Commonly known as: GLUCOPHAGE-XR Take 750 mg by mouth daily.   metoprolol tartrate 50 MG tablet Commonly known as: LOPRESSOR Take 50 mg by mouth 2 (two) times daily.   montelukast 10 MG  tablet Commonly known as: SINGULAIR Take 10 mg by mouth at bedtime.   potassium chloride 10 MEQ tablet Commonly known as: KLOR-CON Take 10 mEq by mouth daily.   predniSONE 10 MG tablet Commonly known as: DELTASONE Take 6 tablets (60 mg total) by mouth daily for 3 days, THEN 5 tablets (50 mg total) daily for 3 days, THEN 4 tablets (40 mg total) daily for 3 days, THEN 2 tablets (20 mg total) daily for 1 day. Start taking on: Jan 23, 2023        Allergies  Allergen Reactions   Egg-Derived Products Anaphylaxis    Egg albumin   Fentanyl Anaphylaxis   Morphine And Codeine     unknown   Spiriva Handihaler [Tiotropium Bromide Monohydrate] Other (See Comments)    bronchiospasms   Sulfa Antibiotics Hives   Penicillins Rash    Has patient had a PCN reaction causing immediate rash, facial/tongue/throat swelling, SOB or lightheadedness with hypotension: YES Has patient had a PCN reaction causing severe rash involving mucus membranes or skin necrosis: NO Has patient had a PCN reaction that required hospitalization NO Has patient had a PCN reaction occurring within the last 10 years: NO If all of the above answers are "NO", then may  proceed with Cephalosporin use.   Voltaren [Diclofenac] Rash    Consultations: None   Procedures/Studies: ECHOCARDIOGRAM COMPLETE  Result Date: 01/20/2023    ECHOCARDIOGRAM REPORT   Patient Name:   COLINDA [name] [facility] Date of Exam: 01/19/2023 Medical Rec #:  [id number]         Height:       60.0 in Accession #:    [id number]        Weight:       224.9 lb Date of Birth:  1951-04-01         BSA:          1.962 m Patient Age:    71 years          BP:           123/71 mmHg Patient Gender: F                 HR:           117 bpm. Exam Location:  [facility] Procedure: 2D Echo, Cardiac Doppler and Color Doppler Indications:     I50.31 Acute Diastolic CHF  History:         Patient has prior history of Echocardiogram examinations, most                  recent 05/18/2022.  Arrythmias:Tachycardia, Signs/Symptoms:Edema;                  Risk Factors:Dyslipidemia.  Sonographer:     Daphine Deutscher RDCS Referring Phys:  [id number] Andris Baumann Diagnosing Phys: Yvonne Kendall MD  Sonographer Comments: Technically difficult study due to poor echo windows. IMPRESSIONS  1. Intracavitary gradient up to 68 mmHg. Left ventricular ejection fraction, by estimation, is >75%. The left ventricle has hyperdynamic function. Left ventricular endocardial border not optimally defined to evaluate regional wall motion. Left ventricular diastolic parameters are consistent with Grade I diastolic dysfunction (impaired relaxation). Elevated left atrial pressure.  2. Right ventricular systolic function is normal. The right ventricular size is normal.  3. The mitral valve is grossly normal. No evidence of mitral valve regurgitation. No evidence of mitral stenosis.  4. The aortic valve was not well visualized. Aortic valve regurgitation is not visualized. Elevated LVOT gradient appreciated, though limited views of the valve do not suggest significant stenosis. FINDINGS  Left Ventricle: Intracavitary gradient up to 68 mmHg. Left ventricular ejection fraction, by estimation, is >75%. The left ventricle has hyperdynamic function. Left ventricular endocardial border not optimally defined to evaluate regional wall motion. The left ventricular internal cavity size was normal in size. There is no left ventricular hypertrophy. Left ventricular diastolic parameters are consistent with Grade I diastolic dysfunction (impaired relaxation). Elevated left atrial pressure. Right Ventricle: The right ventricular size is normal. No increase in right ventricular wall thickness. Right ventricular systolic function is normal. Left Atrium: Left atrial size was normal in size. Right Atrium: Right atrial size was normal in size. Pericardium: There is no evidence of pericardial effusion. Mitral Valve: The mitral valve is grossly  normal. There is mild thickening of the mitral valve leaflet(s). No evidence of mitral valve regurgitation. No evidence of mitral valve stenosis. Tricuspid Valve: The tricuspid valve is not well visualized. Tricuspid valve regurgitation is not demonstrated. Aortic Valve: The aortic valve was not well visualized. Aortic valve regurgitation is not visualized. Elevated LVOT gradient appreciated, though limited views of the valve do not suggest significant stenosis. Pulmonic Valve: The pulmonic valve was not well visualized. Pulmonic valve  regurgitation is not visualized. No evidence of pulmonic stenosis. Aorta: The aortic root is normal in size and structure. Pulmonary Artery: The pulmonary artery is not well seen. Venous: The inferior vena cava was not well visualized. IAS/Shunts: The interatrial septum was not well visualized.  LEFT VENTRICLE PLAX 2D LVIDd:         5.00 cm   Diastology LVIDs:         3.10 cm   LV e' medial:    4.68 cm/s LV PW:         0.70 cm   LV E/e' medial:  19.6 LV IVS:        0.70 cm   LV e' lateral:   6.10 cm/s LVOT diam:     1.80 cm   LV E/e' lateral: 15.0 LVOT Area:     2.54 cm  RIGHT VENTRICLE             IVC RV Basal diam:  2.90 cm     IVC diam: 1.90 cm RV S prime:     16.18 cm/s LEFT ATRIUM             Index        RIGHT ATRIUM           Index LA diam:        4.20 cm 2.14 cm/m   RA Area:     11.50 cm LA Vol (A2C):   38.5 ml 19.62 ml/m  RA Volume:   28.60 ml  14.58 ml/m LA Vol (A4C):   31.9 ml 16.26 ml/m LA Biplane Vol: 38.8 ml 19.78 ml/m   AORTA Ao Root diam: 2.70 cm MITRAL VALVE MV Area (PHT): 4.68 cm     SHUNTS MV Decel Time: 162 msec     Systemic Diam: 1.80 cm MV E velocity: 91.70 cm/s MV A velocity: 131.00 cm/s MV E/A ratio:  0.70 Cristal Deer End MD Electronically signed by Yvonne Kendall MD Signature Date/Time: 01/20/2023/10:31:42 AM    Final    DG Chest Port 1 View  Result Date: 01/18/2023 CLINICAL DATA:  Questionable sepsis EXAM: PORTABLE CHEST 1 VIEW COMPARISON:  Chest  x-ray 12/30/2022 FINDINGS: The heart size and mediastinal contours are within normal limits. Both lungs are clear. The visualized skeletal structures are unremarkable. IMPRESSION: No active disease. Electronically Signed   By: Darliss Cheney M.D.   On: 01/18/2023 23:08   DG Chest Port 1 View  Result Date: 12/30/2022 CLINICAL DATA:  Shortness of breath EXAM: PORTABLE CHEST 1 VIEW COMPARISON:  05/18/2022 FINDINGS: Hyperinflation with interstitial coarsening. There is no edema, consolidation, effusion, or pneumothorax. Normal heart size and mediastinal contours. Artifact from EKG leads. IMPRESSION: COPD without acute superimposed finding. Electronically Signed   By: Tiburcio Pea M.D.   On: 12/30/2022 05:55      Subjective: Seen and examined at the time of discharge.  Stable no distress.  Appropriate for discharge home  Discharge Exam: Vitals:   01/23/23 0903 01/23/23 1149  BP: (!) 111/54 133/68  Pulse: 80 66  Resp: 18 18  Temp: 98.4 F (36.9 C)   SpO2: 93% 93%   Vitals:   01/23/23 0709 01/23/23 0728 01/23/23 0903 01/23/23 1149  BP:   (!) 111/54 133/68  Pulse:   80 66  Resp:   18 18  Temp:   98.4 F (36.9 C)   TempSrc:   Oral   SpO2:  92% 93% 93%  Weight: 90.5 kg     Height:  General: Pt is alert, awake, not in acute distress Cardiovascular: RRR, S1/S2 +, no rubs, no gallops Respiratory: Improved air entry.  Mild scattered end expiratory wheeze.  Normal work of breathing Abdominal: Soft, NT, ND, bowel sounds + Extremities: no edema, no cyanosis    The results of significant diagnostics from this hospitalization (including imaging, microbiology, ancillary and laboratory) are listed below for reference.     Microbiology: Recent Results (from the past 240 hour(s))  Blood culture (routine single)     Status: None   Collection Time: 01/18/23 10:59 PM   Specimen: BLOOD  Result Value Ref Range Status   Specimen Description BLOOD BLOOD RIGHT ARM  Final   Special  Requests   Final    BOTTLES DRAWN AEROBIC AND ANAEROBIC Blood Culture results may not be optimal due to an excessive volume of blood received in culture bottles   Culture   Final    NO GROWTH 5 DAYS Performed at [facility], [address] [place], [place] [address]    Report Status 01/23/2023 FINAL  Final  Expectorated Sputum Assessment w Gram Stain, Rflx to Resp Cult     Status: None   Collection Time: 01/19/23  9:34 AM   Specimen: Sputum  Result Value Ref Range Status   Specimen Description SPUTUM  Final   Special Requests NONE  Final   Sputum evaluation   Final    THIS SPECIMEN IS ACCEPTABLE FOR SPUTUM CULTURE Performed at [facility], [address], [place], [place] [address]    Report Status 01/19/2023 FINAL  Final  Culture, Respiratory w Gram Stain     Status: None   Collection Time: 01/19/23  9:34 AM   Specimen: SPU  Result Value Ref Range Status   Specimen Description   Final    SPUTUM Performed at [facility], [address] [place], [place] [address]    Special Requests   Final    NONE Reflexed from [id number] Performed at [facility], [address] [place], [place] [address]    Gram Stain   Final    ABUNDANT WBC PRESENT, PREDOMINANTLY PMN RARE GRAM POSITIVE COCCI IN PAIRS    Culture   Final    MODERATE Normal respiratory flora-no Staph aureus or Pseudomonas seen Performed at [facility], [address], [place], [place] [address]    Report Status 01/21/2023 FINAL  Final  SARS Coronavirus 2 by RT PCR (hospital order, performed in [facility] hospital lab) *cepheid single result test* Anterior Nasal Swab     Status: None   Collection Time: 01/19/23  1:56 PM   Specimen: Anterior Nasal Swab  Result Value Ref Range Status   SARS Coronavirus 2 by RT PCR NEGATIVE NEGATIVE Final    Comment: (NOTE) SARS-CoV-2 target nucleic acids are NOT DETECTED.  The SARS-CoV-2 RNA is generally detectable in upper and  lower respiratory specimens during the acute phase of infection. The lowest concentration of SARS-CoV-2 viral copies this assay can detect is 250 copies / mL. A negative result does not preclude SARS-CoV-2 infection and should not be used as the sole basis for treatment or other patient management decisions.  A negative result may occur with improper specimen collection / handling, submission of specimen other than nasopharyngeal swab, presence of viral mutation(s) within the areas targeted by this assay, and inadequate number of viral copies (<250 copies / mL). A negative result must be combined with clinical observations, patient history, and epidemiological information.  Fact Sheet for Patients:   RoadLapTop.co.za  Fact Sheet for Healthcare Providers: http://kim-miller.com/  This test is not yet approved or  cleared by the [place] FDA and has been authorized for detection and/or diagnosis of SARS-CoV-2 by FDA under an Emergency Use Authorization (EUA).  This EUA will remain in effect (meaning this test can be used) for the duration of the COVID-19 declaration under Section 564(b)(1) of the Act, 21 U.S.C. section 360bbb-3(b)(1), unless the authorization is terminated or revoked sooner.  Performed at [facility], [address] [place], [place] [address]   Respiratory (~20 pathogens) panel by PCR     Status: None   Collection Time: 01/19/23  1:56 PM   Specimen: Nasopharyngeal Swab; Respiratory  Result Value Ref Range Status   Adenovirus NOT DETECTED NOT DETECTED Final   Coronavirus 229E NOT DETECTED NOT DETECTED Final    Comment: (NOTE) The Coronavirus on the Respiratory Panel, DOES NOT test for the novel  Coronavirus (2019 nCoV)    Coronavirus HKU1 NOT DETECTED NOT DETECTED Final   Coronavirus NL63 NOT DETECTED NOT DETECTED Final   Coronavirus OC43 NOT DETECTED NOT DETECTED Final   Metapneumovirus NOT DETECTED  NOT DETECTED Final   Rhinovirus / Enterovirus NOT DETECTED NOT DETECTED Final   Influenza A NOT DETECTED NOT DETECTED Final   Influenza B NOT DETECTED NOT DETECTED Final   Parainfluenza Virus 1 NOT DETECTED NOT DETECTED Final   Parainfluenza Virus 2 NOT DETECTED NOT DETECTED Final   Parainfluenza Virus 3 NOT DETECTED NOT DETECTED Final   Parainfluenza Virus 4 NOT DETECTED NOT DETECTED Final   Respiratory Syncytial Virus NOT DETECTED NOT DETECTED Final   Bordetella pertussis NOT DETECTED NOT DETECTED Final   Bordetella Parapertussis NOT DETECTED NOT DETECTED Final   Chlamydophila pneumoniae NOT DETECTED NOT DETECTED Final   Mycoplasma pneumoniae NOT DETECTED NOT DETECTED Final    Comment: Performed at [facility], [address] [place], [place] [address]  MRSA Next Gen by PCR, Nasal     Status: None   Collection Time: 01/20/23  6:21 PM   Specimen: Nasal Mucosa; Nasal Swab  Result Value Ref Range Status   MRSA by PCR Next Gen NOT DETECTED NOT DETECTED Final    Comment: (NOTE) The GeneXpert MRSA Assay (FDA approved for NASAL specimens only), is one component of a comprehensive MRSA colonization surveillance program. It is not intended to diagnose MRSA infection nor to guide or monitor treatment for MRSA infections. Test performance is not FDA approved in patients less than 38 years old. Performed at [facility], [address] [place], [place] [address]      Labs: BNP (last 3 results) Recent Labs    05/18/22 0029 12/30/22 0529 01/18/23 2259  BNP 59.7 97.9 127.8*   Basic Metabolic Panel: Recent Labs  Lab 01/18/23 2259 01/21/23 0627 01/22/23 0610 01/23/23 0615  NA 138 136 135 140  K 4.2 3.5 4.0 3.7  CL 97* 96* 94* 97*  CO2 30 31 29  32  GLUCOSE 171* 188* 208* 193*  BUN 23 32* 30* 33*  CREATININE 1.01* 0.85 0.88 0.97  CALCIUM 9.3 8.9 9.2 9.4   Liver Function Tests: Recent Labs  Lab 01/18/23 2259  AST 36  ALT 51*  ALKPHOS 90  BILITOT 1.1   PROT 7.7  ALBUMIN 4.5   No results for input(s): "LIPASE", "AMYLASE" in the last 168 hours. No results for input(s): "AMMONIA" in the last 168 hours. CBC: Recent Labs  Lab  01/18/23 2259 01/21/23 0627 01/22/23 0610 01/23/23 0615  WBC 9.3 8.7 11.0* 11.9*  NEUTROABS 4.2  --   --   --   HGB 14.7 13.3 14.5 14.2  HCT 45.9 40.5 43.8 42.8  MCV 95.0 92.9 91.3 90.9  PLT 207 164 188 185   Cardiac Enzymes: No results for input(s): "CKTOTAL", "CKMB", "CKMBINDEX", "TROPONINI" in the last 168 hours. BNP: Invalid input(s): "POCBNP" CBG: Recent Labs  Lab 01/22/23 0823 01/22/23 1133 01/22/23 1637 01/22/23 2102 01/23/23 0819  GLUCAP 237* 300* 124* 341* 170*   D-Dimer No results for input(s): "DDIMER" in the last 72 hours. Hgb A1c No results for input(s): "HGBA1C" in the last 72 hours. Lipid Profile No results for input(s): "CHOL", "HDL", "LDLCALC", "TRIG", "CHOLHDL", "LDLDIRECT" in the last 72 hours. Thyroid function studies No results for input(s): "TSH", "T4TOTAL", "T3FREE", "THYROIDAB" in the last 72 hours.  Invalid input(s): "FREET3" Anemia work up No results for input(s): "VITAMINB12", "FOLATE", "FERRITIN", "TIBC", "IRON", "RETICCTPCT" in the last 72 hours. Urinalysis    Component Value Date/Time   COLORURINE Yellow 09/27/2014 1820   APPEARANCEUR Hazy 09/27/2014 1820   LABSPEC 1.025 09/27/2014 1820   PHURINE 5.0 09/27/2014 1820   GLUCOSEU Negative 09/27/2014 1820   HGBUR 1+ 09/27/2014 1820   BILIRUBINUR 1+ 09/27/2014 1820   KETONESUR Negative 09/27/2014 1820   PROTEINUR 100 mg/dL 16/06/9603 5409   NITRITE Negative 09/27/2014 1820   LEUKOCYTESUR Negative 09/27/2014 1820   Sepsis Labs Recent Labs  Lab 01/18/23 2259 01/21/23 0627 01/22/23 0610 01/23/23 0615  WBC 9.3 8.7 11.0* 11.9*   Microbiology Recent Results (from the past 240 hour(s))  Blood culture (routine single)     Status: None   Collection Time: 01/18/23 10:59 PM   Specimen: BLOOD  Result Value Ref  Range Status   Specimen Description BLOOD BLOOD RIGHT ARM  Final   Special Requests   Final    BOTTLES DRAWN AEROBIC AND ANAEROBIC Blood Culture results may not be optimal due to an excessive volume of blood received in culture bottles   Culture   Final    NO GROWTH 5 DAYS Performed at [facility], [address] [place], [place] [address]    Report Status 01/23/2023 FINAL  Final  Expectorated Sputum Assessment w Gram Stain, Rflx to Resp Cult     Status: None   Collection Time: 01/19/23  9:34 AM   Specimen: Sputum  Result Value Ref Range Status   Specimen Description SPUTUM  Final   Special Requests NONE  Final   Sputum evaluation   Final    THIS SPECIMEN IS ACCEPTABLE FOR SPUTUM CULTURE Performed at [facility], [address], [place], [place] [address]    Report Status 01/19/2023 FINAL  Final  Culture, Respiratory w Gram Stain     Status: None   Collection Time: 01/19/23  9:34 AM   Specimen: SPU  Result Value Ref Range Status   Specimen Description   Final    SPUTUM Performed at [facility], [address] [place], [place] [address]    Special Requests   Final    NONE Reflexed from [id number] Performed at [facility], [address] [place], [place] [address]    Gram Stain   Final    ABUNDANT WBC PRESENT, PREDOMINANTLY PMN RARE GRAM POSITIVE COCCI IN PAIRS    Culture   Final    MODERATE Normal respiratory flora-no Staph aureus or Pseudomonas seen Performed at [facility], [address] [place], [place] [address]    Report Status 01/21/2023 FINAL  Final  SARS Coronavirus 2 by RT PCR (hospital order, performed in [facility] hospital lab) *cepheid single result test* Anterior Nasal Swab     Status: None   Collection Time: 01/19/23  1:56 PM   Specimen: Anterior Nasal Swab  Result Value Ref Range Status   SARS Coronavirus 2 by RT PCR NEGATIVE NEGATIVE Final    Comment: (NOTE) SARS-CoV-2 target nucleic acids are NOT  DETECTED.  The SARS-CoV-2 RNA is generally detectable in upper and lower respiratory specimens during the acute phase of infection. The lowest concentration of SARS-CoV-2 viral copies this assay can detect is 250 copies / mL. A negative result does not preclude SARS-CoV-2 infection and should not be used as the sole basis for treatment or other patient management decisions.  A negative result may occur with improper specimen collection / handling, submission of specimen other than nasopharyngeal swab, presence of viral mutation(s) within the areas targeted by this assay, and inadequate number of viral copies (<250 copies / mL). A negative result must be combined with clinical observations, patient history, and epidemiological information.  Fact Sheet for Patients:   RoadLapTop.co.za  Fact Sheet for Healthcare Providers: [email or web address]  This test is not yet approved or  cleared by the [place] FDA and has been authorized for detection and/or diagnosis of SARS-CoV-2 by FDA under an Emergency Use Authorization (EUA).  This EUA will remain in effect (meaning this test can be used) for the duration of the COVID-19 declaration under Section 564(b)(1) of the Act, 21 U.S.C. section 360bbb-3(b)(1), unless the authorization is terminated or revoked sooner.  Performed at [facility], [address] [place], [place] [address]   Respiratory (~20 pathogens) panel by PCR     Status: None   Collection Time: 01/19/23  1:56 PM   Specimen: Nasopharyngeal Swab; Respiratory  Result Value Ref Range Status   Adenovirus NOT DETECTED NOT DETECTED Final   Coronavirus 229E NOT DETECTED NOT DETECTED Final    Comment: (NOTE) The Coronavirus on the Respiratory Panel, DOES NOT test for the novel  Coronavirus (2019 nCoV)    Coronavirus HKU1 NOT DETECTED NOT DETECTED Final   Coronavirus NL63 NOT DETECTED NOT DETECTED Final   Coronavirus  OC43 NOT DETECTED NOT DETECTED Final   Metapneumovirus NOT DETECTED NOT DETECTED Final   Rhinovirus / Enterovirus NOT DETECTED NOT DETECTED Final   Influenza A NOT DETECTED NOT DETECTED Final   Influenza B NOT DETECTED NOT DETECTED Final   Parainfluenza Virus 1 NOT DETECTED NOT DETECTED Final   Parainfluenza Virus 2 NOT DETECTED NOT DETECTED Final   Parainfluenza Virus 3 NOT DETECTED NOT DETECTED Final   Parainfluenza Virus 4 NOT DETECTED NOT DETECTED Final   Respiratory Syncytial Virus NOT DETECTED NOT DETECTED Final   Bordetella pertussis NOT DETECTED NOT DETECTED Final   Bordetella Parapertussis NOT DETECTED NOT DETECTED Final   Chlamydophila pneumoniae NOT DETECTED NOT DETECTED Final   Mycoplasma pneumoniae NOT DETECTED NOT DETECTED Final    Comment: Performed at [facility], [address], [place], [place] [address]  MRSA Next Gen by PCR, Nasal     Status: None   Collection Time: 01/20/23  6:21 PM   Specimen: Nasal Mucosa; Nasal Swab  Result Value Ref Range Status   MRSA by PCR Next Gen NOT DETECTED NOT DETECTED Final    Comment: (NOTE) The GeneXpert MRSA Assay (FDA approved for NASAL specimens only),  is one component of a comprehensive MRSA colonization surveillance program. It is not intended to diagnose MRSA infection nor to guide or monitor treatment for MRSA infections. Test performance is not FDA approved in patients less than 92 years old. Performed at [facility], [address] [place], [place] [address]      Time coordinating discharge: Over 30 minutes  SIGNED:   Tresa Moore, MD  [facility] 01/23/2023, 11:59 AM Pager   If 7PM-7AM, please contact night-coverage

## 2023-01-23 NOTE — Progress Notes (Signed)
  Transition of Care [facility]) Screening Note   Patient Details  Name: Madison [name] Date of Birth: 1950/10/14   [facility] CM/SW Contact:    Truddie Hidden, RN Phone Number: 01/23/2023, 10:18 AM    Transition of Care Department [facility]) has reviewed patient and no TOC needs have been identified at this time. We will continue to monitor patient advancement through interdisciplinary progression rounds. If new patient transition needs arise, please place a TOC consult.

## 2023-01-23 NOTE — TOC Transition Note (Signed)
Transition of Care [facility]) - Progression Note    Patient Details  Name: Madison [name] MRN: [id number] Date of Birth: Dec 25, 1950  Transition of Care [facility]) CM/SW Contact  Truddie Hidden, RN Phone Number: 01/23/2023, 12:25 PM  Clinical Narrative:    Spoke with patient regarding discharge plan. She declined to the need for [facility] RN. Patient's daughter will transport her home.  TOC signing off.        Expected Discharge Plan and Services         Expected Discharge Date: 01/23/23                                     Social Determinants of Health (SDOH) Interventions SDOH Screenings   Food Insecurity: No Food Insecurity (01/20/2023)  Housing: Low Risk  (01/20/2023)  Transportation Needs: No Transportation Needs (01/20/2023)  Utilities: Not At Risk (01/20/2023)  Tobacco Use: Medium Risk (01/18/2023)    Readmission Risk Interventions     No data to display

## 2023-01-28 ENCOUNTER — Other Ambulatory Visit: Payer: Self-pay

## 2023-01-28 MED ORDER — ELIQUIS 5 MG PO TABS: ORAL_TABLET | ORAL | 3 refills | Status: AC

## 2023-01-30 ENCOUNTER — Other Ambulatory Visit: Payer: Self-pay

## 2023-06-10 ENCOUNTER — Encounter: Payer: Self-pay | Admitting: Emergency Medicine

## 2023-06-10 ENCOUNTER — Inpatient Hospital Stay
Admission: EM | Admit: 2023-06-10 | Discharge: 2023-06-18 | DRG: 189 | Disposition: A | Discharge status: Home / Self Care | Payer: Medicare Other | Attending: Internal Medicine | Admitting: Internal Medicine

## 2023-06-10 ENCOUNTER — Other Ambulatory Visit: Payer: Self-pay

## 2023-06-10 ENCOUNTER — Inpatient Hospital Stay: Payer: Medicare Other

## 2023-06-10 ENCOUNTER — Emergency Department: Payer: Medicare Other

## 2023-06-10 DIAGNOSIS — Z79899 Other long term (current) drug therapy: Secondary | ICD-10-CM | POA: Diagnosis not present

## 2023-06-10 DIAGNOSIS — Z87891 Personal history of nicotine dependence: Secondary | ICD-10-CM

## 2023-06-10 DIAGNOSIS — Z1152 Encounter for screening for COVID-19: Secondary | ICD-10-CM

## 2023-06-10 DIAGNOSIS — Z23 Encounter for immunization: Secondary | ICD-10-CM

## 2023-06-10 DIAGNOSIS — E1165 Type 2 diabetes mellitus with hyperglycemia: Secondary | ICD-10-CM | POA: Diagnosis not present

## 2023-06-10 DIAGNOSIS — L03119 Cellulitis of unspecified part of limb: Secondary | ICD-10-CM | POA: Diagnosis present

## 2023-06-10 DIAGNOSIS — F419 Anxiety disorder, unspecified: Secondary | ICD-10-CM | POA: Diagnosis present

## 2023-06-10 DIAGNOSIS — I5033 Acute on chronic diastolic (congestive) heart failure: Secondary | ICD-10-CM

## 2023-06-10 DIAGNOSIS — E119 Type 2 diabetes mellitus without complications: Secondary | ICD-10-CM

## 2023-06-10 DIAGNOSIS — E876 Hypokalemia: Secondary | ICD-10-CM | POA: Diagnosis present

## 2023-06-10 DIAGNOSIS — I480 Paroxysmal atrial fibrillation: Secondary | ICD-10-CM

## 2023-06-10 DIAGNOSIS — Z7984 Long term (current) use of oral hypoglycemic drugs: Secondary | ICD-10-CM

## 2023-06-10 DIAGNOSIS — Z9049 Acquired absence of other specified parts of digestive tract: Secondary | ICD-10-CM

## 2023-06-10 DIAGNOSIS — Z7901 Long term (current) use of anticoagulants: Secondary | ICD-10-CM | POA: Diagnosis not present

## 2023-06-10 DIAGNOSIS — T380X5A Adverse effect of glucocorticoids and synthetic analogues, initial encounter: Secondary | ICD-10-CM | POA: Diagnosis not present

## 2023-06-10 DIAGNOSIS — E785 Hyperlipidemia, unspecified: Secondary | ICD-10-CM | POA: Diagnosis present

## 2023-06-10 DIAGNOSIS — Z833 Family history of diabetes mellitus: Secondary | ICD-10-CM

## 2023-06-10 DIAGNOSIS — I130 Hypertensive heart and chronic kidney disease with heart failure and stage 1 through stage 4 chronic kidney disease, or unspecified chronic kidney disease: Secondary | ICD-10-CM | POA: Diagnosis present

## 2023-06-10 DIAGNOSIS — Z6841 Body Mass Index (BMI) 40.0 and over, adult: Secondary | ICD-10-CM

## 2023-06-10 DIAGNOSIS — J9621 Acute and chronic respiratory failure with hypoxia: Principal | ICD-10-CM

## 2023-06-10 DIAGNOSIS — L03116 Cellulitis of left lower limb: Secondary | ICD-10-CM | POA: Diagnosis present

## 2023-06-10 DIAGNOSIS — E66813 Obesity, class 3: Secondary | ICD-10-CM | POA: Diagnosis present

## 2023-06-10 DIAGNOSIS — I5032 Chronic diastolic (congestive) heart failure: Secondary | ICD-10-CM | POA: Diagnosis present

## 2023-06-10 DIAGNOSIS — J441 Chronic obstructive pulmonary disease with (acute) exacerbation: Secondary | ICD-10-CM | POA: Diagnosis present

## 2023-06-10 DIAGNOSIS — I10 Essential (primary) hypertension: Secondary | ICD-10-CM

## 2023-06-10 DIAGNOSIS — J9622 Acute and chronic respiratory failure with hypercapnia: Secondary | ICD-10-CM | POA: Diagnosis present

## 2023-06-10 DIAGNOSIS — Z8249 Family history of ischemic heart disease and other diseases of the circulatory system: Secondary | ICD-10-CM

## 2023-06-10 DIAGNOSIS — N189 Chronic kidney disease, unspecified: Secondary | ICD-10-CM | POA: Diagnosis present

## 2023-06-10 DIAGNOSIS — E1122 Type 2 diabetes mellitus with diabetic chronic kidney disease: Secondary | ICD-10-CM | POA: Diagnosis present

## 2023-06-10 DIAGNOSIS — Z83438 Family history of other disorder of lipoprotein metabolism and other lipidemia: Secondary | ICD-10-CM

## 2023-06-10 DIAGNOSIS — J9601 Acute respiratory failure with hypoxia: Secondary | ICD-10-CM

## 2023-06-10 DIAGNOSIS — E1151 Type 2 diabetes mellitus with diabetic peripheral angiopathy without gangrene: Secondary | ICD-10-CM | POA: Diagnosis present

## 2023-06-10 DIAGNOSIS — Z885 Allergy status to narcotic agent status: Secondary | ICD-10-CM

## 2023-06-10 DIAGNOSIS — Z8701 Personal history of pneumonia (recurrent): Secondary | ICD-10-CM

## 2023-06-10 DIAGNOSIS — Z87892 Personal history of anaphylaxis: Secondary | ICD-10-CM

## 2023-06-10 DIAGNOSIS — Z91012 Allergy to eggs: Secondary | ICD-10-CM

## 2023-06-10 DIAGNOSIS — Z888 Allergy status to other drugs, medicaments and biological substances status: Secondary | ICD-10-CM

## 2023-06-10 DIAGNOSIS — Z880 Allergy status to penicillin: Secondary | ICD-10-CM

## 2023-06-10 LAB — CBC WITH DIFFERENTIAL/PLATELET
Abs Immature Granulocytes: 0.05 10*3/uL (ref 0.00–0.07)
Basophils Absolute: 0.1 10*3/uL (ref 0.0–0.1)
Basophils Relative: 1 %
Eosinophils Absolute: 0.9 10*3/uL — ABNORMAL HIGH (ref 0.0–0.5)
Eosinophils Relative: 10 %
HCT: 45.9 % (ref 36.0–46.0)
Hemoglobin: 14.7 g/dL (ref 12.0–15.0)
Immature Granulocytes: 1 %
Lymphocytes Relative: 32 %
Lymphs Abs: 2.9 10*3/uL (ref 0.7–4.0)
MCH: 30.6 pg (ref 26.0–34.0)
MCHC: 32 g/dL (ref 30.0–36.0)
MCV: 95.4 fL (ref 80.0–100.0)
Monocytes Absolute: 0.7 10*3/uL (ref 0.1–1.0)
Monocytes Relative: 8 %
Neutro Abs: 4.5 10*3/uL (ref 1.7–7.7)
Neutrophils Relative %: 48 %
Platelets: 231 10*3/uL (ref 150–400)
RBC: 4.81 MIL/uL (ref 3.87–5.11)
RDW: 12.8 % (ref 11.5–15.5)
WBC: 9.1 10*3/uL (ref 4.0–10.5)
nRBC: 0 % (ref 0.0–0.2)

## 2023-06-10 LAB — COMPREHENSIVE METABOLIC PANEL
ALT: 46 U/L — ABNORMAL HIGH (ref 0–44)
AST: 28 U/L (ref 15–41)
Albumin: 4.1 g/dL (ref 3.5–5.0)
Alkaline Phosphatase: 71 U/L (ref 38–126)
Anion gap: 14 (ref 5–15)
BUN: 16 mg/dL (ref 8–23)
CO2: 29 mmol/L (ref 22–32)
Calcium: 9.4 mg/dL (ref 8.9–10.3)
Chloride: 96 mmol/L — ABNORMAL LOW (ref 98–111)
Creatinine, Ser: 0.74 mg/dL (ref 0.44–1.00)
GFR, Estimated: >60 mL/min (ref >60)
Glucose, Bld: 211 mg/dL — ABNORMAL HIGH (ref 70–99)
Potassium: 3.7 mmol/L (ref 3.5–5.1)
Sodium: 139 mmol/L (ref 135–145)
Total Bilirubin: 1 mg/dL (ref 0.3–1.2)
Total Protein: 7.2 g/dL (ref 6.5–8.1)

## 2023-06-10 LAB — BLOOD GAS, VENOUS
Acid-Base Excess: 7.9 mmol/L — ABNORMAL HIGH (ref 0.0–2.0)
Bicarbonate: 35.9 mmol/L — ABNORMAL HIGH (ref 20.0–28.0)
O2 Saturation: 79.9 %
Patient temperature: 37
pCO2, Ven: 65 mm[Hg] — ABNORMAL HIGH (ref 44–60)
pH, Ven: 7.35 (ref 7.25–7.43)
pO2, Ven: 49 mm[Hg] — ABNORMAL HIGH (ref 32–45)

## 2023-06-10 LAB — CBG MONITORING, ED: Glucose-Capillary: 171 mg/dL — ABNORMAL HIGH (ref 70–99)

## 2023-06-10 LAB — BRAIN NATRIURETIC PEPTIDE: B Natriuretic Peptide: 173.4 pg/mL — ABNORMAL HIGH (ref 0.0–100.0)

## 2023-06-10 LAB — TROPONIN I (HIGH SENSITIVITY): Troponin I (High Sensitivity): 7 ng/L (ref <18)

## 2023-06-10 LAB — SARS CORONAVIRUS 2 BY RT PCR: SARS Coronavirus 2 by RT PCR: NEGATIVE

## 2023-06-10 MED ORDER — HYDRALAZINE HCL 20 MG/ML IJ SOLN: 5.0000 mg | INTRAMUSCULAR | Status: DC | PRN

## 2023-06-10 MED ORDER — DM-GUAIFENESIN ER 30-600 MG PO TB12
1.0000 | ORAL_TABLET | Freq: Two times a day (BID) | ORAL | Status: DC | PRN
  Administered 2023-06-11 – 2023-06-16 (×8): 1 via ORAL
  Filled 2023-06-10 (×9): qty 1

## 2023-06-10 MED ORDER — ACETAMINOPHEN 325 MG PO TABS
650.0000 mg | ORAL_TABLET | Freq: Four times a day (QID) | ORAL | Status: DC | PRN
  Administered 2023-06-11 – 2023-06-18 (×15): 650 mg via ORAL
  Filled 2023-06-10 (×15): qty 2

## 2023-06-10 MED ORDER — SODIUM CHLORIDE 0.9 % IV SOLN
2.0000 g | Freq: Once | INTRAVENOUS | Status: AC
  Administered 2023-06-10: 2 g via INTRAVENOUS
  Filled 2023-06-10: qty 20

## 2023-06-10 MED ORDER — SODIUM CHLORIDE 0.9 % IV SOLN
500.0000 mg | Freq: Once | INTRAVENOUS | Status: AC
  Administered 2023-06-10: 500 mg via INTRAVENOUS
  Filled 2023-06-10: qty 5

## 2023-06-10 MED ORDER — ALBUTEROL SULFATE (2.5 MG/3ML) 0.083% IN NEBU
2.5000 mg | INHALATION_SOLUTION | Freq: Once | RESPIRATORY_TRACT | Status: AC
  Administered 2023-06-10: 2.5 mg via RESPIRATORY_TRACT
  Filled 2023-06-10: qty 3

## 2023-06-10 MED ORDER — INSULIN ASPART 100 UNIT/ML IJ SOLN
0.0000 [IU] | Freq: Three times a day (TID) | INTRAMUSCULAR | Status: DC
  Administered 2023-06-11: 1 [IU] via SUBCUTANEOUS
  Administered 2023-06-11: 5 [IU] via SUBCUTANEOUS
  Administered 2023-06-11: 7 [IU] via SUBCUTANEOUS
  Administered 2023-06-12 (×2): 2 [IU] via SUBCUTANEOUS
  Administered 2023-06-12: 25 [IU] via SUBCUTANEOUS
  Administered 2023-06-13: 3 [IU] via SUBCUTANEOUS
  Administered 2023-06-13 (×2): 9 [IU] via SUBCUTANEOUS
  Administered 2023-06-14: 5 [IU] via SUBCUTANEOUS
  Administered 2023-06-14: 2 [IU] via SUBCUTANEOUS
  Administered 2023-06-14 – 2023-06-15 (×2): 7 [IU] via SUBCUTANEOUS
  Administered 2023-06-15: 2 [IU] via SUBCUTANEOUS
  Administered 2023-06-16: 3 [IU] via SUBCUTANEOUS
  Administered 2023-06-16: 2 [IU] via SUBCUTANEOUS
  Administered 2023-06-16: 3 [IU] via SUBCUTANEOUS
  Administered 2023-06-17: 5 [IU] via SUBCUTANEOUS
  Administered 2023-06-17: 7 [IU] via SUBCUTANEOUS
  Administered 2023-06-18: 9 [IU] via SUBCUTANEOUS
  Filled 2023-06-10 (×20): qty 1

## 2023-06-10 MED ORDER — SODIUM CHLORIDE 0.9 % IV SOLN: Freq: Once | INTRAVENOUS | Status: DC

## 2023-06-10 MED ORDER — DIPHENHYDRAMINE HCL 50 MG/ML IJ SOLN
12.5000 mg | Freq: Three times a day (TID) | INTRAMUSCULAR | Status: DC | PRN
  Filled 2023-06-10: qty 1

## 2023-06-10 MED ORDER — FUROSEMIDE 10 MG/ML IJ SOLN
40.0000 mg | Freq: Two times a day (BID) | INTRAMUSCULAR | Status: DC
  Administered 2023-06-10 – 2023-06-12 (×4): 40 mg via INTRAVENOUS
  Filled 2023-06-10 (×4): qty 4

## 2023-06-10 MED ORDER — ALBUTEROL SULFATE (2.5 MG/3ML) 0.083% IN NEBU: 2.5000 mg | INHALATION_SOLUTION | RESPIRATORY_TRACT | Status: DC

## 2023-06-10 MED ORDER — VANCOMYCIN HCL 2000 MG/400ML IV SOLN
2000.0000 mg | Freq: Once | INTRAVENOUS | Status: AC
  Administered 2023-06-11: 2000 mg via INTRAVENOUS
  Filled 2023-06-10: qty 400

## 2023-06-10 MED ORDER — SODIUM CHLORIDE 0.9 % IV BOLUS
500.0000 mL | Freq: Once | INTRAVENOUS | Status: AC
  Administered 2023-06-10: 500 mL via INTRAVENOUS

## 2023-06-10 MED ORDER — INSULIN ASPART 100 UNIT/ML IJ SOLN
0.0000 [IU] | Freq: Every day | INTRAMUSCULAR | Status: DC
  Administered 2023-06-12 – 2023-06-15 (×2): 2 [IU] via SUBCUTANEOUS
  Administered 2023-06-16: 3 [IU] via SUBCUTANEOUS
  Administered 2023-06-17: 2 [IU] via SUBCUTANEOUS
  Filled 2023-06-10 (×4): qty 1

## 2023-06-10 MED ORDER — SODIUM CHLORIDE 0.9 % IV SOLN
2.0000 g | INTRAVENOUS | Status: DC
  Administered 2023-06-11 – 2023-06-12 (×2): 2 g via INTRAVENOUS
  Filled 2023-06-10 (×2): qty 20

## 2023-06-10 NOTE — H&P (Signed)
History and Physical    Desirree Inwood [name]:[id number] DOB: 22-Feb-1951 DOA: 06/10/2023  Referring MD/NP/PA:   PCP: Dione Housekeeper, MD   Patient coming from:  The patient is coming from home.     Chief Complaint: SOB  HPI: Madison [name] is a 72 y.o. female with medical history significant of COPD on prn O2 (up to 4L of O2 at night per pt), dCHF,  HTN, HLD,  DM, A fib on Eliquis, morbid obesity, who presents with shortness of breath.   Patient states that she has worsening shortness breath for several days, which has been progressively worsening.  She has wheezing and cough with yellow sputum production.  Denies chest pain, fever or chills.  No nausea, no vomiting, diarrhea or abdominal pain currently.  Denies symptoms of UTI.  Patient was found to have severe respiratory distress, cannot speak in full sentence, using accessory muscle for breathing in tripoding position in ED. BiPAP is started in ED. Per report, pt was given 125 mg of Solu-Medrol and 2 g of magnesium by EMS.  Patient has chronic bilateral leg edema and chronic bilateral lower leg erythema, and stating that her left lower leg is more erythematous and painful.  Data reviewed independently and ED Course: pt was found to have WBC 9.1, GFR> 60, negative COVID PCR, BNP 173, VBG with pH 7.35, CO2 65, O2 79.9.  Temperature normal, soft blood pressure 90/59, 94/51, which improved to 123/64 after giving 500 cc normal saline in ED, heart rate 80, RR 25.  Chest x-ray negative.  Patient is admitted to PCU as inpatient.   EKG: I have personally reviewed.  Sinus rhythm, QTc 500, low voltage, and stable baseline  Review of Systems:   General: no fevers, chills, no body weight gain, has fatigue HEENT: no blurry vision, hearing changes or sore throat Respiratory: has dyspnea, coughing, wheezing CV: no chest pain, no palpitations GI: no nausea, vomiting, abdominal pain, diarrhea, constipation GU: no dysuria, burning on  urination, increased urinary frequency, hematuria  Ext: has leg edema Neuro: no unilateral weakness, numbness, or tingling, no vision change or hearing loss Skin: no rash, no skin tear. MSK: No muscle spasm, no deformity, no limitation of range of movement in spin Heme: No easy bruising.  Travel history: No recent long distant travel.   Allergy:  Allergies  Allergen Reactions   Egg-Derived Products Anaphylaxis    Egg albumin   Fentanyl Anaphylaxis   Morphine And Codeine     unknown   Spiriva Handihaler [Tiotropium Bromide Monohydrate] Other (See Comments)    bronchiospasms   Sulfa Antibiotics Hives   Penicillins Rash    Has patient had a PCN reaction causing immediate rash, facial/tongue/throat swelling, SOB or lightheadedness with hypotension: YES Has patient had a PCN reaction causing severe rash involving mucus membranes or skin necrosis: NO Has patient had a PCN reaction that required hospitalization NO Has patient had a PCN reaction occurring within the last 10 years: NO If all of the above answers are "NO", then may proceed with Cephalosporin use.   Voltaren [Diclofenac] Rash    Past Medical History:  Diagnosis Date   Anemia    Arthritis    Blood transfusion without reported diagnosis    Chronic kidney disease 05/22/2016   urinary retention  has catheter      Edema extremities    Hyperlipidemia    Peripheral vascular disease (HCC)    Pneumonia 11/2013   Hx. pneumonia  Seasonal allergies    Tachycardia     Past Surgical History:  Procedure Laterality Date   ANKLE SURGERY Right    APPENDECTOMY     BACK SURGERY  05/2016   CARPAL TUNNEL RELEASE Left    CHOLECYSTECTOMY     TONSILLECTOMY     TUBAL LIGATION     VEIN LIGATION AND STRIPPING      Social History:  reports that she quit smoking about 2 years ago. Her smoking use included cigarettes. She started smoking about 3 years ago. She has a 0.3 pack-year smoking history. She has never used smokeless  tobacco. She reports current alcohol use. She reports that she does not use drugs.  Family History:  Family History  Problem Relation Age of Onset   Heart disease Mother    Hypertension Mother    Hyperlipidemia Mother    Diabetes Brother      Prior to Admission medications   Medication Sig Start Date End Date Taking? Authorizing Provider  acetaminophen (TYLENOL) 500 MG tablet Take 1,000 mg by mouth every 6 (six) hours as needed for mild pain.    [provider]  albuterol (VENTOLIN HFA) 108 (90 Base) MCG/ACT inhaler Inhale 2 puffs into the lungs every 4 (four) hours as needed for wheezing or shortness of breath. 07/03/22 07/03/23  [provider]  apixaban (ELIQUIS) 5 MG TABS tablet Take 1 tablet (5 mg total) by mouth every 12 (twelve) hours 01/28/23     atorvastatin (LIPITOR) 20 MG tablet Take 20 mg by mouth daily. 08/27/22 08/27/23  [provider]  Cholecalciferol 25 MCG (1000 UT) capsule Take 1,000 Units by mouth daily.    [provider]  cyanocobalamin (VITAMIN B12) 500 MCG tablet Take 500 mcg by mouth daily.    [provider]  dextromethorphan-guaiFENesin (MUCINEX DM) 30-600 MG 12hr tablet Take 1 tablet by mouth 2 (two) times daily. 05/19/22   Arnetha Courser, MD  famotidine (PEPCID) 20 MG tablet Take 20 mg by mouth daily. 05/15/20   [provider]  fluticasone (FLONASE) 50 MCG/ACT nasal spray Place 2 sprays into both nostrils daily.     [provider]  furosemide (LASIX) 40 MG tablet Take 1 tablet (40 mg total) by mouth daily. 01/23/23 02/22/23  Tresa Moore, MD  ipratropium-albuterol (DUONEB) 0.5-2.5 (3) MG/3ML SOLN Take 3 mLs by nebulization every 6 (six) hours as needed (Shortness of breath and wheezing). 05/19/22   Arnetha Courser, MD  loratadine-pseudoephedrine (CLARITIN-D 24-HOUR) 10-240 MG 24 hr tablet Take 1 tablet by mouth daily.    [provider]  LORazepam (ATIVAN) 0.5 MG tablet Take 1 tablet (0.5 mg  total) by mouth every 6 (six) hours as needed for anxiety. 01/23/23   Tresa Moore, MD  metFORMIN (GLUCOPHAGE-XR) 750 MG 24 hr tablet Take 750 mg by mouth daily. 05/14/22   [provider]  metoprolol (LOPRESSOR) 50 MG tablet Take 50 mg by mouth 2 (two) times daily.  02/26/16   [provider]  montelukast (SINGULAIR) 10 MG tablet Take 10 mg by mouth at bedtime. 07/03/22 07/03/23  [provider]  potassium chloride (KLOR-CON) 10 MEQ tablet Take 10 mEq by mouth daily. 10/24/21   [provider]    Physical Exam: Vitals:   06/10/23 2000 06/10/23 2030 06/10/23 2300  BP: (!) 90/57 (!) 94/51 114/61  Pulse: 80 77 80  Resp: (!) 24 (!) 25 19  Temp: 97.8 F (36.6 C)    TempSrc: Axillary  SpO2: 100% 100% 100%  Weight:   96.2 kg   General: Not in acute distress HEENT:       Eyes: PERRL, EOMI, no jaundice       ENT: No discharge from the ears and nose, no pharynx injection, no tonsillar enlargement.        Neck: Difficult to assess JVD due to obesity, no bruit, no mass felt. Heme: No neck lymph node enlargement. Cardiac: S1/S2, RRR, No murmurs, No gallops or rubs. Respiratory: Has wheezing bilaterally GI: Soft, nondistended, nontender, no rebound pain, no organomegaly, BS present. GU: No hematuria Ext: Has bilateral 1+ leg edema.  Has erythema in both lower legs, left leg is worse than the right.  The left lower leg is tender and warm on touch. Musculoskeletal: No joint deformities, No joint redness or warmth, no limitation of ROM in spin. Skin: No rashes.  Neuro: Alert, oriented X3, cranial nerves II-XII grossly intact, moves all extremities normally. Psych: Patient is not psychotic, no suicidal or hemocidal ideation.  Labs on Admission: I have personally reviewed following labs and imaging studies  CBC: Recent Labs  Lab 06/10/23 2002  WBC 9.1  NEUTROABS 4.5  HGB 14.7  HCT 45.9  MCV 95.4  PLT 231   Basic Metabolic Panel: Recent Labs  Lab  06/10/23 2002  NA 139  K 3.7  CL 96*  CO2 29  GLUCOSE 211*  BUN 16  CREATININE 0.74  CALCIUM 9.4   GFR: Estimated Creatinine Clearance: 64.1 mL/min (by C-G formula based on SCr of 0.74 mg/dL). Liver Function Tests: Recent Labs  Lab 06/10/23 2002  AST 28  ALT 46*  ALKPHOS 71  BILITOT 1.0  PROT 7.2  ALBUMIN 4.1   No results for input(s): "LIPASE", "AMYLASE" in the last 168 hours. No results for input(s): "AMMONIA" in the last 168 hours. Coagulation Profile: No results for input(s): "INR", "PROTIME" in the last 168 hours. Cardiac Enzymes: No results for input(s): "CKTOTAL", "CKMB", "CKMBINDEX", "TROPONINI" in the last 168 hours. BNP (last 3 results) No results for input(s): "PROBNP" in the last 8760 hours. HbA1C: No results for input(s): "HGBA1C" in the last 72 hours. CBG: Recent Labs  Lab 06/10/23 2236  GLUCAP 171*   Lipid Profile: No results for input(s): "CHOL", "HDL", "LDLCALC", "TRIG", "CHOLHDL", "LDLDIRECT" in the last 72 hours. Thyroid Function Tests: No results for input(s): "TSH", "T4TOTAL", "FREET4", "T3FREE", "THYROIDAB" in the last 72 hours. Anemia Panel: No results for input(s): "VITAMINB12", "FOLATE", "FERRITIN", "TIBC", "IRON", "RETICCTPCT" in the last 72 hours. Urine analysis:    Component Value Date/Time   COLORURINE Yellow 09/27/2014 1820   APPEARANCEUR Hazy 09/27/2014 1820   LABSPEC 1.025 09/27/2014 1820   PHURINE 5.0 09/27/2014 1820   GLUCOSEU Negative 09/27/2014 1820   HGBUR 1+ 09/27/2014 1820   BILIRUBINUR 1+ 09/27/2014 1820   KETONESUR Negative 09/27/2014 1820   PROTEINUR 100 mg/dL 81/19/1478 2956   NITRITE Negative 09/27/2014 1820   LEUKOCYTESUR Negative 09/27/2014 1820   Sepsis Labs: @LABRCNTIP (procalcitonin:4,lacticidven:4) ) Recent Results (from the past 240 hour(s))  SARS Coronavirus 2 by RT PCR (hospital order, performed in [facility] hospital lab) *cepheid single result test* Anterior Nasal Swab     Status: None    Collection Time: 06/10/23  8:02 PM   Specimen: Anterior Nasal Swab  Result Value Ref Range Status   SARS Coronavirus 2 by RT PCR NEGATIVE NEGATIVE Final    Comment: (NOTE) SARS-CoV-2 target nucleic acids are NOT DETECTED.  The SARS-CoV-2 RNA is generally detectable  in upper and lower respiratory specimens during the acute phase of infection. The lowest concentration of SARS-CoV-2 viral copies this assay can detect is 250 copies / mL. A negative result does not preclude SARS-CoV-2 infection and should not be used as the sole basis for treatment or other patient management decisions.  A negative result may occur with improper specimen collection / handling, submission of specimen other than nasopharyngeal swab, presence of viral mutation(s) within the areas targeted by this assay, and inadequate number of viral copies (<250 copies / mL). A negative result must be combined with clinical observations, patient history, and epidemiological information.  Fact Sheet for Patients:   RoadLapTop.co.za  Fact Sheet for Healthcare Providers: http://kim-miller.com/  This test is not yet approved or  cleared by the [place] FDA and has been authorized for detection and/or diagnosis of SARS-CoV-2 by FDA under an Emergency Use Authorization (EUA).  This EUA will remain in effect (meaning this test can be used) for the duration of the COVID-19 declaration under Section 564(b)(1) of the Act, 21 U.S.C. section 360bbb-3(b)(1), unless the authorization is terminated or revoked sooner.  Performed at [facility] [address] [place], [place] [address]      Radiological Exams on Admission: DG Chest Portable 1 View  Result Date: 06/10/2023 CLINICAL DATA:  Shortness of breath for 2 days EXAM: PORTABLE CHEST 1 VIEW COMPARISON:  01/18/2023 FINDINGS: Cardiac shadow is within normal limits. Aortic calcifications are seen. The lungs are clear  bilaterally. No bony abnormality is noted. IMPRESSION: No acute abnormality noted. Electronically Signed   By: Alcide Clever M.D.   On: 06/10/2023 20:49      Assessment/Plan Principal Problem:   Acute on chronic respiratory failure with hypoxia and hypercapnia (HCC) Active Problems:   COPD exacerbation (HCC)   Acute on chronic diastolic CHF (congestive heart failure) (HCC)   Paroxysmal atrial fibrillation (HCC)   Essential hypertension   Cellulitis of lower extremity   Diabetes mellitus without complication (HCC)   HLD (hyperlipidemia)   Obesity, Class III, BMI 40-49.9 (morbid obesity) (HCC)   Assessment and Plan:   Acute on chronic respiratory failure with hypoxia and hypercapnia: Patient has SOB, productive cough, wheezing on auscultation, consistent with COPD exacerbation.  Patient has 1+ bilateral leg edema and elevated BNP 173, clinically consistent with CHF exacerbation.     -Admitted to PCU as inpatient -BiPAP, try to wean off BiPAP -Bronchodilators, antibiotic and Solu-Medrol for COPD -Lasix for CHF exacerbation -Nasal cannula oxygen to maintain oxygen saturation above 93% when patient is off BiPAP   COPD exacerbation -Bronchodilators -sigulair -Patient received 2 g of magnesium sulfate and 125 mg of Solu-Medrol -Solu-Medrol 40 mg IV bid -Antibiotics: on vancomycin and Rocephin which are for lower extremity cellulitis (patient received 1 dose of azithromycin in ED) -Mucinex for cough  -Incentive spirometry -sputum culture   Acute on chronic diastolic CHF (congestive heart failure): 2D echo on 01/19/2023 showed EF> 75%.  -Lasix 40 mg bid by IV -Daily weights -strict I/O's -Low salt diet -Fluid restriction -As needed bronchodilators for shortness of breath   Paroxysmal atrial fibrillation: HR 80s.  -Eliquis -Metoprolol   Essential hypertension -IV hydralazine as needed -Metoprolol -Patient is on IV Lasix   Diabetes mellitus without complication: Recent A1c  9.2, poorly controlled.  Patient is taking metformin at home -Sliding scale insulin   HLD: -Lipitor   Obesity, Class III, BMI 40-49.9 (morbid obesity): Body weight 96.2 kg, BMI 41.40 -Encourage to lose some weight, -Healthy  diet and exercise   Cellulitis of lower extremity-left lower leg: -on vancomycin and Rocephin -Follow-up of blood culture     DVT ppx: on Eliquis  Code Status: Full code per pt  Family Communication: not done, no family member is at bed side.      Disposition Plan:  Anticipate discharge back to previous environment  Consults called:  none  Admission status and Level of care: Progressive:   as inpt     Dispo: The patient is from: Home              Anticipated d/c is to: Home              Anticipated d/c date is: 2 days              Patient currently is not medically stable to d/c.    Severity of Illness:  The appropriate patient status for this patient is INPATIENT. Inpatient status is judged to be reasonable and necessary in order to provide the required intensity of service to ensure the patient's safety. The patient's presenting symptoms, physical exam findings, and initial radiographic and laboratory data in the context of their chronic comorbidities is felt to place them at high risk for further clinical deterioration. Furthermore, it is not anticipated that the patient will be medically stable for discharge from the hospital within 2 midnights of admission.   * I certify that at the point of admission it is my clinical judgment that the patient will require inpatient hospital care spanning beyond 2 midnights from the point of admission due to high intensity of service, high risk for further deterioration and high frequency of surveillance required.*       Date of Service 06/11/2023    Lorretta Harp [facility]   If 7PM-7AM, please contact night-coverage [email or web address] 06/11/2023, 12:27 AM

## 2023-06-10 NOTE — Progress Notes (Signed)
Pharmacy Antibiotic Note  Madison [name] is a 72 y.o. female admitted on 06/10/2023 with cellulitis.  Pharmacy has been consulted for Vancomycin dosing.  Plan: Pt given Vancomycin 2000 mg once. Vancomycin 1000 mg IV Q 24 hrs. Goal AUC 400-550. Expected AUC: 470.7 SCr used: 0.74, Vd used: 0.5, BMI: 41.3  Pharmacy will continue to follow and will adjust abx dosing whenever warranted.  Temp (24hrs), Avg:97.8 F (36.6 C), Min:97.8 F (36.6 C), Max:97.8 F (36.6 C)   Recent Labs  Lab 06/10/23 2002  WBC 9.1  CREATININE 0.74    Estimated Creatinine Clearance: 64.1 mL/min (by C-G formula based on SCr of 0.74 mg/dL).    Allergies  Allergen Reactions   Egg-Derived Products Anaphylaxis    Egg albumin   Fentanyl Anaphylaxis   Morphine And Codeine     unknown   Spiriva Handihaler [Tiotropium Bromide Monohydrate] Other (See Comments)    bronchiospasms   Sulfa Antibiotics Hives   Penicillins Rash    Has patient had a PCN reaction causing immediate rash, facial/tongue/throat swelling, SOB or lightheadedness with hypotension: YES Has patient had a PCN reaction causing severe rash involving mucus membranes or skin necrosis: NO Has patient had a PCN reaction that required hospitalization NO Has patient had a PCN reaction occurring within the last 10 years: NO If all of the above answers are "NO", then may proceed with Cephalosporin use.   Voltaren [Diclofenac] Rash    Antimicrobials this admission: 10/02 Azithromycin >> x 1 dose 10/02 Ceftriaxone >>  10/03 Vancomycin >>   Microbiology results: 10/02 BCx: Pending 10/02 Sputum: Pending   Thank you for allowing pharmacy to be a part of this patient's care.  Otelia Sergeant, PharmD, [facility] 06/10/2023 11:27 PM

## 2023-06-10 NOTE — ED Provider Notes (Signed)
[facility] Provider Note    Event Date/Time   First MD Initiated Contact with Patient 06/10/23 1958     (approximate)   History   Respiratory Distress   HPI  Madison [name] is a 72 y.o. female with history of COPD, CHF, CKD, here with shortness of breath.  The patient states that for the last several days, she has had progressive worsening, now severe shortness of breath.  She has been trying to use home medications without significant relief.  Over the last 24 hours, her breathing is acutely worsened.  She has been in obvious respiratory stress with EMS.  She was given steroids, IV magnesium, and 3 DuoNebs.  She states her symptoms are mildly improved but she remains very tachypneic, speaking in short sentences.  Denies any chest pain.     Physical Exam   Triage Vital Signs: ED Triage Vitals  Encounter Vitals Group     BP      Systolic BP Percentile      Diastolic BP Percentile      Pulse      Resp      Temp      Temp src      SpO2      Weight      Height      Head Circumference      Peak Flow      Pain Score      Pain Loc      Pain Education      Exclude from Growth Chart     Most recent vital signs: Vitals:   06/10/23 2030 06/10/23 2300  BP: (!) 94/51 114/61  Pulse: 77 80  Resp: (!) 25 19  Temp:    SpO2: 100% 100%     General: Awake, sitting upright, mild respiratory distress. CV:  Good peripheral perfusion.  Regular rate and rhythm. Resp:  Significantly increased work of breathing with retractions, diminished aeration.  Sitting upright. Abd:  No distention.  No tenderness. Other:  2+ pitting edema bilateral lower extremities.   ED Results / Procedures / Treatments   Labs (all labs ordered are listed, but only abnormal results are displayed) Labs Reviewed  BLOOD GAS, VENOUS - Abnormal; Notable for the following components:      Result Value   pCO2, Ven 65 (*)    pO2, Ven 49 (*)    Bicarbonate 35.9 (*)    Acid-Base  Excess 7.9 (*)    All other components within normal limits  CBC WITH DIFFERENTIAL/PLATELET - Abnormal; Notable for the following components:   Eosinophils Absolute 0.9 (*)    All other components within normal limits  COMPREHENSIVE METABOLIC PANEL - Abnormal; Notable for the following components:   Chloride 96 (*)    Glucose, Bld 211 (*)    ALT 46 (*)    All other components within normal limits  BRAIN NATRIURETIC PEPTIDE - Abnormal; Notable for the following components:   B Natriuretic Peptide 173.4 (*)    All other components within normal limits  CBG MONITORING, ED - Abnormal; Notable for the following components:   Glucose-Capillary 171 (*)    All other components within normal limits  SARS CORONAVIRUS 2 BY RT PCR  CULTURE, BLOOD (ROUTINE X 2)  CULTURE, BLOOD (ROUTINE X 2)  EXPECTORATED SPUTUM ASSESSMENT W GRAM STAIN, RFLX TO RESP C  PROCALCITONIN  C-REACTIVE PROTEIN  SEDIMENTATION RATE  HIV ANTIBODY (ROUTINE TESTING W REFLEX)  BASIC METABOLIC PANEL  CBC  TROPONIN I (HIGH SENSITIVITY)     EKG Sinus rhythm, VR 80. pR 197, QRS 94, QTc 500. No acute ST elevations.   RADIOLOGY CXR: Clear   I also independently reviewed and agree with radiologist interpretations.   PROCEDURES:  Critical Care performed: Yes, see critical care procedure note(s)  .Critical Care  Performed by: Shaune Pollack, MD Authorized by: Shaune Pollack, MD   Critical care provider statement:    Critical care time (minutes):  30   Critical care time was exclusive of:  Separately billable procedures and treating other patients   Critical care was necessary to treat or prevent imminent or life-threatening deterioration of the following conditions:  Cardiac failure, respiratory failure and circulatory failure   Critical care was time spent personally by me on the following activities:  Development of treatment plan with patient or surrogate, discussions with consultants, evaluation of patient's  response to treatment, examination of patient, ordering and review of laboratory studies, ordering and review of radiographic studies, ordering and performing treatments and interventions, pulse oximetry, re-evaluation of patient's condition and review of old charts     MEDICATIONS ORDERED IN ED: Medications  insulin aspart (novoLOG) injection 0-9 Units (has no administration in time range)  insulin aspart (novoLOG) injection 0-5 Units ( Subcutaneous Not Given 06/10/23 2238)  hydrALAZINE (APRESOLINE) injection 5 mg (has no administration in time range)  acetaminophen (TYLENOL) tablet 650 mg (has no administration in time range)  diphenhydrAMINE (BENADRYL) injection 12.5 mg (has no administration in time range)  dextromethorphan-guaiFENesin (MUCINEX DM) 30-600 MG per 12 hr tablet 1 tablet (has no administration in time range)  cefTRIAXone (ROCEPHIN) 2 g in sodium chloride 0.9 % 100 mL IVPB (has no administration in time range)  furosemide (LASIX) injection 40 mg (40 mg Intravenous Given 06/10/23 2321)  vancomycin (VANCOREADY) IVPB 2000 mg/400 mL (2,000 mg Intravenous New Bag/Given 06/11/23 0019)  atorvastatin (LIPITOR) tablet 20 mg (has no administration in time range)  metoprolol tartrate (LOPRESSOR) tablet 50 mg (has no administration in time range)  famotidine (PEPCID) tablet 20 mg (has no administration in time range)  apixaban (ELIQUIS) tablet 5 mg (has no administration in time range)  loratadine (CLARITIN) tablet 10 mg (has no administration in time range)  montelukast (SINGULAIR) tablet 10 mg (has no administration in time range)  fluticasone (FLONASE) 50 MCG/ACT nasal spray 2 spray (has no administration in time range)  ipratropium-albuterol (DUONEB) 0.5-2.5 (3) MG/3ML nebulizer solution 3 mL (has no administration in time range)  albuterol (PROVENTIL) (2.5 MG/3ML) 0.083% nebulizer solution 2.5 mg (has no administration in time range)  methylPREDNISolone sodium succinate (SOLU-MEDROL)  125 mg/2 mL injection 80 mg (80 mg Intravenous Given 06/11/23 0103)  albuterol (PROVENTIL) (2.5 MG/3ML) 0.083% nebulizer solution 2.5 mg (2.5 mg Nebulization Given 06/10/23 2044)  albuterol (PROVENTIL) (2.5 MG/3ML) 0.083% nebulizer solution 2.5 mg (2.5 mg Nebulization Given 06/10/23 2044)  albuterol (PROVENTIL) (2.5 MG/3ML) 0.083% nebulizer solution 2.5 mg (2.5 mg Nebulization Given 06/10/23 2044)  sodium chloride 0.9 % bolus 500 mL (0 mLs Intravenous Stopped 06/10/23 2320)  cefTRIAXone (ROCEPHIN) 2 g in sodium chloride 0.9 % 100 mL IVPB (0 g Intravenous Stopped 06/10/23 2224)  azithromycin (ZITHROMAX) 500 mg in sodium chloride 0.9 % 250 mL IVPB (0 mg Intravenous Stopped 06/10/23 2320)     IMPRESSION / MDM / ASSESSMENT AND PLAN / ED COURSE  I reviewed the triage vital signs and the nursing notes.  Differential diagnosis includes, but is not limited to, COPD exacerbation, CHF, PNA, anemia, arrhythmia  Patient's presentation is most consistent with acute presentation with potential threat to life or bodily function.  The patient is on the cardiac monitor to evaluate for evidence of arrhythmia and/or significant heart rate changes   72 yo F with h/o COPD, CHF, here with SOB. Pt in moderate resp distress on arrival. Placed on BIPAP, breathing tx continued. CXR shows no PNA. Labs overall reassuring. Procal negative. BNP slightly elevated.Trop negative and EKG nonischemic. CMP with normal renal function, lytes. CBC without leukocytosis. Suspect primary COPD exacerbation, possible component of CHF though intravascularly she appears dry. Will admit to medicine.   FINAL CLINICAL IMPRESSION(S) / ED DIAGNOSES   Final diagnoses:  COPD exacerbation (HCC)  Acute respiratory failure with hypoxia (HCC)     Rx / DC Orders   ED Discharge Orders     None        Note:  This document was prepared using Dragon voice recognition software and may include unintentional dictation  errors.   Shaune Pollack, MD 06/11/23 [phone]

## 2023-06-10 NOTE — ED Triage Notes (Signed)
BIBA Per EMS: pt coming from home w/ c/o SHOB x 2 days. Pt tripoding upon EMS arrival. Pt also has ankle swelling. Hx COPD. Wheezing noted in all lobes. 98% w/ duonebs. 4 duonebs, 125 solumedrol, 2 mag given en route. Hx ventilation.

## 2023-06-11 ENCOUNTER — Encounter: Payer: Self-pay | Admitting: Internal Medicine

## 2023-06-11 DIAGNOSIS — E876 Hypokalemia: Secondary | ICD-10-CM | POA: Insufficient documentation

## 2023-06-11 LAB — CBG MONITORING, ED
Glucose-Capillary: 259 mg/dL — ABNORMAL HIGH (ref 70–99)
Glucose-Capillary: 349 mg/dL — ABNORMAL HIGH (ref 70–99)
Glucose-Capillary: 130 mg/dL — ABNORMAL HIGH (ref 70–99)

## 2023-06-11 LAB — BASIC METABOLIC PANEL
Anion gap: 14 (ref 5–15)
BUN: 16 mg/dL (ref 8–23)
CO2: 28 mmol/L (ref 22–32)
Calcium: 8.4 mg/dL — ABNORMAL LOW (ref 8.9–10.3)
Chloride: 95 mmol/L — ABNORMAL LOW (ref 98–111)
Creatinine, Ser: 0.85 mg/dL (ref 0.44–1.00)
GFR, Estimated: >60 mL/min (ref >60)
Glucose, Bld: 254 mg/dL — ABNORMAL HIGH (ref 70–99)
Potassium: 3.3 mmol/L — ABNORMAL LOW (ref 3.5–5.1)
Sodium: 137 mmol/L (ref 135–145)

## 2023-06-11 LAB — EXPECTORATED SPUTUM ASSESSMENT W GRAM STAIN, RFLX TO RESP C

## 2023-06-11 LAB — CBC
HCT: 40.4 % (ref 36.0–46.0)
Hemoglobin: 13.6 g/dL (ref 12.0–15.0)
MCH: 30.6 pg (ref 26.0–34.0)
MCHC: 33.7 g/dL (ref 30.0–36.0)
MCV: 91 fL (ref 80.0–100.0)
Platelets: 187 10*3/uL (ref 150–400)
RBC: 4.44 MIL/uL (ref 3.87–5.11)
RDW: 12.9 % (ref 11.5–15.5)
WBC: 6.7 10*3/uL (ref 4.0–10.5)
nRBC: 0 % (ref 0.0–0.2)

## 2023-06-11 LAB — GLUCOSE, CAPILLARY: Glucose-Capillary: 182 mg/dL — ABNORMAL HIGH (ref 70–99)

## 2023-06-11 LAB — PROCALCITONIN: Procalcitonin: <0.1 ng/mL

## 2023-06-11 LAB — HIV ANTIBODY (ROUTINE TESTING W REFLEX): HIV Screen 4th Generation wRfx: NONREACTIVE

## 2023-06-11 LAB — SEDIMENTATION RATE: Sed Rate: 18 mm/h (ref 0–30)

## 2023-06-11 LAB — C-REACTIVE PROTEIN: CRP: 2.2 mg/dL — ABNORMAL HIGH (ref <1.0)

## 2023-06-11 MED ORDER — LORATADINE 10 MG PO TABS
10.0000 mg | ORAL_TABLET | Freq: Every day | ORAL | Status: DC
  Administered 2023-06-11 – 2023-06-18 (×8): 10 mg via ORAL
  Filled 2023-06-11 (×8): qty 1

## 2023-06-11 MED ORDER — LORAZEPAM 0.5 MG PO TABS: 0.5000 mg | ORAL_TABLET | Freq: Four times a day (QID) | ORAL | Status: DC | PRN

## 2023-06-11 MED ORDER — FLUTICASONE PROPIONATE 50 MCG/ACT NA SUSP
2.0000 | Freq: Every day | NASAL | Status: DC
  Administered 2023-06-12 – 2023-06-18 (×7): 2 via NASAL
  Filled 2023-06-11 (×2): qty 16

## 2023-06-11 MED ORDER — POTASSIUM CHLORIDE 10 MEQ/100ML IV SOLN
10.0000 meq | INTRAVENOUS | Status: DC
  Filled 2023-06-11: qty 100

## 2023-06-11 MED ORDER — METOPROLOL TARTRATE 50 MG PO TABS
50.0000 mg | ORAL_TABLET | Freq: Two times a day (BID) | ORAL | Status: DC
  Administered 2023-06-11 – 2023-06-18 (×15): 50 mg via ORAL
  Filled 2023-06-11 (×15): qty 1

## 2023-06-11 MED ORDER — ATORVASTATIN CALCIUM 20 MG PO TABS
20.0000 mg | ORAL_TABLET | Freq: Every day | ORAL | Status: DC
  Administered 2023-06-11 – 2023-06-18 (×8): 20 mg via ORAL
  Filled 2023-06-11 (×8): qty 1

## 2023-06-11 MED ORDER — GUAIFENESIN 100 MG/5ML PO LIQD
5.0000 mL | ORAL | Status: DC | PRN
  Administered 2023-06-12 – 2023-06-18 (×6): 5 mL via ORAL
  Filled 2023-06-11 (×6): qty 10

## 2023-06-11 MED ORDER — MONTELUKAST SODIUM 10 MG PO TABS
10.0000 mg | ORAL_TABLET | Freq: Every day | ORAL | Status: DC
  Administered 2023-06-11 – 2023-06-17 (×7): 10 mg via ORAL
  Filled 2023-06-11 (×7): qty 1

## 2023-06-11 MED ORDER — ALBUTEROL SULFATE (2.5 MG/3ML) 0.083% IN NEBU
2.5000 mg | INHALATION_SOLUTION | RESPIRATORY_TRACT | Status: DC | PRN
  Administered 2023-06-11 – 2023-06-17 (×2): 2.5 mg via RESPIRATORY_TRACT
  Filled 2023-06-11 (×2): qty 3

## 2023-06-11 MED ORDER — APIXABAN 5 MG PO TABS
5.0000 mg | ORAL_TABLET | Freq: Two times a day (BID) | ORAL | Status: DC
  Administered 2023-06-11 – 2023-06-18 (×15): 5 mg via ORAL
  Filled 2023-06-11 (×16): qty 1

## 2023-06-11 MED ORDER — VANCOMYCIN HCL IN DEXTROSE 1-5 GM/200ML-% IV SOLN
1000.0000 mg | INTRAVENOUS | Status: DC
  Administered 2023-06-11: 1000 mg via INTRAVENOUS
  Filled 2023-06-11 (×2): qty 200

## 2023-06-11 MED ORDER — POTASSIUM CHLORIDE CRYS ER 20 MEQ PO TBCR
40.0000 meq | EXTENDED_RELEASE_TABLET | ORAL | Status: AC
  Administered 2023-06-11: 40 meq via ORAL
  Filled 2023-06-11: qty 2

## 2023-06-11 MED ORDER — INSULIN GLARGINE-YFGN 100 UNIT/ML ~~LOC~~ SOLN
10.0000 [IU] | Freq: Every day | SUBCUTANEOUS | Status: DC
  Administered 2023-06-11 – 2023-06-15 (×5): 10 [IU] via SUBCUTANEOUS
  Filled 2023-06-11 (×5): qty 0.1

## 2023-06-11 MED ORDER — METHYLPREDNISOLONE SODIUM SUCC 125 MG IJ SOLR
80.0000 mg | Freq: Every day | INTRAMUSCULAR | Status: DC
  Administered 2023-06-11 – 2023-06-12 (×2): 80 mg via INTRAVENOUS
  Filled 2023-06-11 (×2): qty 2

## 2023-06-11 MED ORDER — FAMOTIDINE 20 MG PO TABS
20.0000 mg | ORAL_TABLET | Freq: Every day | ORAL | Status: DC
  Administered 2023-06-11 – 2023-06-17 (×7): 20 mg via ORAL
  Filled 2023-06-11 (×7): qty 1

## 2023-06-11 MED ORDER — IPRATROPIUM-ALBUTEROL 0.5-2.5 (3) MG/3ML IN SOLN
3.0000 mL | RESPIRATORY_TRACT | Status: DC
  Administered 2023-06-11 – 2023-06-12 (×7): 3 mL via RESPIRATORY_TRACT
  Filled 2023-06-11 (×7): qty 3

## 2023-06-11 NOTE — ED Notes (Signed)
Respiratory at bedside at this time

## 2023-06-11 NOTE — Hospital Course (Addendum)
Madison [name] is a 72 y.o. female with medical history significant of COPD on prn O2 (up to 4L of O2 at night per pt), dCHF,  HTN, HLD,  DM, A fib on Eliquis, morbid obesity, who presents with shortness of breath.  Upon arriving the hospital, patient has significant bronchospasm.  Patient was diagnosed with COPD exacerbation, started on steroids.  Patient was also placed on BiPAP initially, then transition to 5 L oxygen. Patient condition continue improved, steroid change to oral 10/4. Patient condition has improved significantly, but still has significant mucus production and cough spells.  May need additional 2 days with Mucomyst and bronchodilators before discharge.

## 2023-06-11 NOTE — ED Notes (Signed)
Patient woke up from nap feeling some SOB sounds very wheezy giving her 2000 duoneb now and going to give her her lasix that is due at 2000. O2 levels 99% on the 4L  RR increased to 35 and coughing. MD Chipper Herb notified and stated to put patient back on BiPAP if needed and notify on call MD.

## 2023-06-11 NOTE — Progress Notes (Signed)
Pt transported to 257 on Bipap without incident. Pt remains on Bipap and is tol well at this time. Report given to floor RT.

## 2023-06-11 NOTE — ED Notes (Signed)
Took patient up to inpatient unit with respiratory updated nurse on patient status.

## 2023-06-11 NOTE — ED Notes (Signed)
Dinner at patient bedside.

## 2023-06-11 NOTE — Progress Notes (Addendum)
Progress Note   Patient: Madison [name] UUV:[id number] DOB: 12/22/1950 DOA: 06/10/2023     1 DOS: the patient was seen and examined on 06/11/2023   Brief hospital course: Madison [name] is a 72 y.o. female with medical history significant of COPD on prn O2 (up to 4L of O2 at night per pt), dCHF,  HTN, HLD,  DM, A fib on Eliquis, morbid obesity, who presents with shortness of breath.  Upon arriving the hospital, patient has significant bronchospasm.  Patient was diagnosed with COPD exacerbation, started on steroids.  Patient was also placed on BiPAP initially, then transition to 5 L oxygen.   Principal Problem:   Acute on chronic respiratory failure with hypoxia and hypercapnia (HCC) Active Problems:   COPD exacerbation (HCC)   Acute on chronic diastolic CHF (congestive heart failure) (HCC)   Paroxysmal atrial fibrillation (HCC)   Essential hypertension   Cellulitis of lower extremity   Diabetes mellitus without complication (HCC)   HLD (hyperlipidemia)   Obesity, Class III, BMI 40-49.9 (morbid obesity) (HCC)   Hypokalemia   Assessment and Plan: Acute on chronic respiratory failure with hypoxia and hypercapnia:  COPD exacerbation Acute on chronic diastolic CHF (congestive heart failure): 2D echo on 01/19/2023 showed EF> 75%.  Patient had a significant respite distress this morning, was on BiPAP.  Received steroids, DuoNeb, Zithromax. Also receiving IV Lasix.  Condition appears to be improving, currently on 4 L oxygen.  Able to breathe better.    Paroxysmal atrial fibrillation: HR 80s.  Continue Eliquis and beta-blocker.   Essential hypertension Continue beta-blocker,   Uncontrolled type 2 diabetes with hyperglycemia: Recent A1c 9.2, poorly controlled.  Patient is taking metformin at home -Sliding scale insulin, add insulin glargine.   HLD: -Lipitor   Obesity, Class III, BMI 40-49.9 (morbid obesity): Body weight 96.2 kg, BMI 41.40 -Encourage to lose some  weight, -Healthy diet and exercise   Cellulitis of lower extremity-left lower leg: -on vancomycin and Rocephin Blood cultures so far negative.  Hypokalemia. Repleted.  Recheck BMP tomorrow.       Subjective:  Patient had a significant aggressive distress in the morning, short of breath gradually improving the afternoon.  Cough, with large amount of gray mucus.  Physical Exam: Vitals:   06/11/23 0830 06/11/23 0957 06/11/23 1130 06/11/23 1305  BP: (!) 139/52   124/60  Pulse: 95  88 67  Resp: (!) 26  (!) 32 (!) 28  Temp:  98 F (36.7 C)    TempSrc:  Oral    SpO2: 97%  96% 100%  Weight:       General exam: Appears calm and comfortable  Respiratory system: Decreased breathing sounds with wheezes.  Breath labored. Cardiovascular system: S1 & S2 heard, RRR. No JVD, murmurs, rubs, gallops or clicks. No pedal edema. Gastrointestinal system: Abdomen is nondistended, soft and nontender. No organomegaly or masses felt. Normal bowel sounds heard. Central nervous system: Alert and oriented. No focal neurological deficits. Extremities: Symmetric 5 x 5 power. Skin: No rashes, lesions or ulcers Psychiatry: Judgement and insight appear normal. Mood & affect appropriate.    Data Reviewed:  Reviewed chest x-ray and lab results.  Family Communication: None  Disposition: Status is: Inpatient Remains inpatient appropriate because: Severity of disease, IV treatment, high risk for deterioration.     Time spent: 55 minutes  Author: Marrion Coy, MD 06/11/2023 2:59 PM  For on call review [email or web address].

## 2023-06-11 NOTE — ED Notes (Signed)
Patient feeling better and less SOB since receiving nebulizer and lasix.

## 2023-06-11 NOTE — ED Notes (Signed)
Lab called this RN and notified that the second sputum sample they received stated they cannot find enough white cells in it and the test failed again and asked to either do another one or they stated respiratory could do a tracheal aspirate. Notified MD and MD stated chest x-ray showed no signs of pneumonia and culture can be discontinued at this time.

## 2023-06-11 NOTE — ED Notes (Signed)
Putting patient back on BIPAP at this time patient feeling SOB again.

## 2023-06-12 LAB — CBC
HCT: 38.8 % (ref 36.0–46.0)
Hemoglobin: 12.9 g/dL (ref 12.0–15.0)
MCH: 30.7 pg (ref 26.0–34.0)
MCHC: 33.2 g/dL (ref 30.0–36.0)
MCV: 92.4 fL (ref 80.0–100.0)
Platelets: 210 10*3/uL (ref 150–400)
RBC: 4.2 MIL/uL (ref 3.87–5.11)
RDW: 12.8 % (ref 11.5–15.5)
WBC: 11 10*3/uL — ABNORMAL HIGH (ref 4.0–10.5)
nRBC: 0.2 % (ref 0.0–0.2)

## 2023-06-12 LAB — TROPONIN I (HIGH SENSITIVITY)
Troponin I (High Sensitivity): 7 ng/L (ref <18)
Troponin I (High Sensitivity): 7 ng/L (ref <18)

## 2023-06-12 LAB — BASIC METABOLIC PANEL
Anion gap: 11 (ref 5–15)
BUN: 23 mg/dL (ref 8–23)
CO2: 29 mmol/L (ref 22–32)
Calcium: 8.9 mg/dL (ref 8.9–10.3)
Chloride: 98 mmol/L (ref 98–111)
Creatinine, Ser: 0.8 mg/dL (ref 0.44–1.00)
GFR, Estimated: >60 mL/min (ref >60)
Glucose, Bld: 188 mg/dL — ABNORMAL HIGH (ref 70–99)
Potassium: 3.2 mmol/L — ABNORMAL LOW (ref 3.5–5.1)
Sodium: 138 mmol/L (ref 135–145)

## 2023-06-12 LAB — GLUCOSE, CAPILLARY
Glucose-Capillary: 199 mg/dL — ABNORMAL HIGH (ref 70–99)
Glucose-Capillary: 462 mg/dL — ABNORMAL HIGH (ref 70–99)
Glucose-Capillary: 163 mg/dL — ABNORMAL HIGH (ref 70–99)
Glucose-Capillary: 215 mg/dL — ABNORMAL HIGH (ref 70–99)

## 2023-06-12 LAB — MAGNESIUM: Magnesium: 2.4 mg/dL (ref 1.7–2.4)

## 2023-06-12 MED ORDER — POTASSIUM CHLORIDE CRYS ER 20 MEQ PO TBCR
40.0000 meq | EXTENDED_RELEASE_TABLET | ORAL | Status: AC
  Administered 2023-06-12 (×2): 40 meq via ORAL
  Filled 2023-06-12 (×2): qty 2

## 2023-06-12 MED ORDER — DOXYCYCLINE HYCLATE 100 MG PO TABS
100.0000 mg | ORAL_TABLET | Freq: Two times a day (BID) | ORAL | Status: DC
  Administered 2023-06-12 – 2023-06-16 (×9): 100 mg via ORAL
  Filled 2023-06-12 (×9): qty 1

## 2023-06-12 MED ORDER — IPRATROPIUM-ALBUTEROL 0.5-2.5 (3) MG/3ML IN SOLN
3.0000 mL | Freq: Four times a day (QID) | RESPIRATORY_TRACT | Status: DC
  Administered 2023-06-12 – 2023-06-13 (×4): 3 mL via RESPIRATORY_TRACT
  Filled 2023-06-12 (×4): qty 3

## 2023-06-12 MED ORDER — PREDNISONE 20 MG PO TABS
40.0000 mg | ORAL_TABLET | Freq: Every day | ORAL | Status: DC
  Administered 2023-06-13 – 2023-06-15 (×3): 40 mg via ORAL
  Filled 2023-06-12 (×3): qty 2

## 2023-06-12 MED ORDER — POTASSIUM CHLORIDE 10 MEQ/100ML IV SOLN
10.0000 meq | Freq: Once | INTRAVENOUS | Status: AC
  Administered 2023-06-12: 10 meq via INTRAVENOUS
  Filled 2023-06-12: qty 100

## 2023-06-12 MED ORDER — INSULIN ASPART 100 UNIT/ML IJ SOLN
5.0000 [IU] | Freq: Three times a day (TID) | INTRAMUSCULAR | Status: DC
  Administered 2023-06-12 – 2023-06-18 (×17): 5 [IU] via SUBCUTANEOUS
  Filled 2023-06-12 (×17): qty 1

## 2023-06-12 NOTE — Progress Notes (Signed)
Transition of Care [facility]) - Inpatient Brief Assessment   Patient Details  Name: Madison [name] MRN: [id number] Date of Birth: [birth year]  Transition of Care [facility]) CM/SW Contact:    Truddie Hidden, RN Phone Number: 06/12/2023, 4:03 PM   Clinical Narrative: TOC continuing to follow patient's progress throughout discharge planning. Patient has home oxygen.    Transition of Care Asessment:   Patient has primary care physician: Yes Home environment has been reviewed: home Prior level of function:: independent Prior/Current Home Services: No current home services Social Determinants of Health Reivew: SDOH reviewed no interventions necessary Readmission risk has been reviewed: Yes Transition of care needs: no transition of care needs at this time

## 2023-06-12 NOTE — Progress Notes (Signed)
Progress Note   Patient: Madison [name] DOB: [birth year] DOA: 06/10/2023     2 DOS: the patient was seen and examined on 06/12/2023   Brief hospital course: Madison [name] is a 72 y.o. female with medical history significant of COPD on prn O2 (up to 4L of O2 at night per pt), dCHF,  HTN, HLD,  DM, A fib on Eliquis, morbid obesity, who presents with shortness of breath.  Upon arriving the hospital, patient has significant bronchospasm.  Patient was diagnosed with COPD exacerbation, started on steroids.  Patient was also placed on BiPAP initially, then transition to 5 L oxygen.   Principal Problem:   Acute on chronic respiratory failure with hypoxia and hypercapnia (HCC) Active Problems:   COPD exacerbation (HCC)   Acute on chronic diastolic CHF (congestive heart failure) (HCC)   Paroxysmal atrial fibrillation (HCC)   Essential hypertension   Cellulitis of lower extremity   Diabetes mellitus without complication (HCC)   HLD (hyperlipidemia)   Obesity, Class III, BMI 40-49.9 (morbid obesity) (HCC)   Hypokalemia   Assessment and Plan:  Acute on chronic respiratory failure with hypoxia and hypercapnia:  COPD exacerbation Acute on chronic diastolic CHF (congestive heart failure): 2D echo on 01/19/2023 showed EF> 75%.  Patient had a significant respite distress this morning, was on BiPAP.  Received steroids, DuoNeb, abx. Also receiving IV Lasix.   Condition seem to be improving, oxygenation is better.  Continue wean oxygen.  Patient volume status better, discontinue IV Lasix.     Paroxysmal atrial fibrillation with rapid ventricular sponsor. Eliquis and beta-blocker. Patient developed atrial fibrillation again today, with heart rate around 100.  Patient also experienced some left-sided chest pain radiates to the left shoulder.  EKG reviewed showed atrial fibrillation no ST elevation.  Troponin will be checked.   Essential hypertension Continue beta-blocker,    Uncontrolled type 2 diabetes with hyperglycemia: Recent A1c 9.2, poorly controlled.  Patient is taking metformin at home -Sliding scale insulin, added insulin glargine.   HLD: -Lipitor   Obesity, Class III, BMI 40-49.9 (morbid obesity): Body weight 96.2 kg, BMI 41.40 -Encourage to lose some weight, -Healthy diet and exercise   Cellulitis of lower extremity-left lower leg: Condition improving, discontinue vancomycin, continue Rocephin and add doxycycline.   Hypokalemia. Continue potassium supplements.  Recheck BMP tomorrow.     Subjective:  Developed another episode atrial fibrillation, also intermittent left-sided chest pain radiates to the left shoulder.  She states that this happens every time she had atrial fibrillation. Short of breath seem to be improving.  Physical Exam: Vitals:   06/12/23 0400 06/12/23 0500 06/12/23 0600 06/12/23 0756  BP: (!) 114/56 (!) 110/55 (!) 112/51 120/68  Pulse: 78 75 79 80  Resp:    (!) 21  Temp:    98.4 F (36.9 C)  TempSrc:    Oral  SpO2: 97% 95% 99% 95%  Weight:       General exam: Appears calm and comfortable  Respiratory system: Decreased breathing sounds, no significant wheezing. Respiratory effort normal. Cardiovascular system: .  Irregularly irregular, tachycardic. no JVD, murmurs, rubs, gallops or clicks. No pedal edema. Gastrointestinal system: Abdomen is nondistended, soft and nontender. No organomegaly or masses felt. Normal bowel sounds heard. Central nervous system: Alert and oriented. No focal neurological deficits. Extremities: Symmetric 5 x 5 power. Skin: No rashes, lesions or ulcers Psychiatry: Judgement and insight appear normal. Mood & affect appropriate.    Data Reviewed:  EKG, lab results reviewed.  Family  Communication: None  Disposition: Status is: Inpatient Remains inpatient appropriate because: Severity of disease, IV treatment.     Time spent: 55 minutes  Author: Marrion Coy, MD 06/12/2023 11:06  AM  For on call review [email or web address].

## 2023-06-12 NOTE — Care Management Important Message (Signed)
Important Message  Patient Details  Name: Madison [name] MRN: [id number] Date of Birth: 02-18-1951   Important Message Given:  Yes - Medicare IM     Johnell Comings 06/12/2023, 2:34 PM

## 2023-06-12 NOTE — Plan of Care (Signed)
  Problem: Education: Goal: Ability to describe self-care measures that may prevent or decrease complications (Diabetes Survival Skills Education) will improve Outcome: Progressing   Problem: Coping: Goal: Ability to adjust to condition or change in health will improve Outcome: Progressing   Problem: Fluid Volume: Goal: Ability to maintain a balanced intake and output will improve Outcome: Progressing   Problem: Health Behavior/Discharge Planning: Goal: Ability to identify and utilize available resources and services will improve Outcome: Progressing   Problem: Metabolic: Goal: Ability to maintain appropriate glucose levels will improve Outcome: Progressing   Problem: Nutritional: Goal: Maintenance of adequate nutrition will improve Outcome: Progressing   Problem: Skin Integrity: Goal: Risk for impaired skin integrity will decrease Outcome: Progressing   Problem: Tissue Perfusion: Goal: Adequacy of tissue perfusion will improve Outcome: Progressing   Problem: Activity: Goal: Capacity to carry out activities will improve Outcome: Progressing   Problem: Cardiac: Goal: Ability to achieve and maintain adequate cardiopulmonary perfusion will improve Outcome: Progressing   Problem: Respiratory: Goal: Ability to maintain a clear airway will improve Outcome: Progressing Goal: Levels of oxygenation will improve Outcome: Progressing Goal: Ability to maintain adequate ventilation will improve Outcome: Progressing   Problem: Clinical Measurements: Goal: Will remain free from infection Outcome: Progressing Goal: Diagnostic test results will improve Outcome: Progressing   Problem: Activity: Goal: Risk for activity intolerance will decrease Outcome: Progressing   Problem: Pain Managment: Goal: General experience of comfort will improve Outcome: Progressing

## 2023-06-13 LAB — BASIC METABOLIC PANEL
Anion gap: 10 (ref 5–15)
BUN: 25 mg/dL — ABNORMAL HIGH (ref 8–23)
CO2: 31 mmol/L (ref 22–32)
Calcium: 9 mg/dL (ref 8.9–10.3)
Chloride: 98 mmol/L (ref 98–111)
Creatinine, Ser: 0.79 mg/dL (ref 0.44–1.00)
GFR, Estimated: >60 mL/min (ref >60)
Glucose, Bld: 161 mg/dL — ABNORMAL HIGH (ref 70–99)
Potassium: 3.7 mmol/L (ref 3.5–5.1)
Sodium: 139 mmol/L (ref 135–145)

## 2023-06-13 LAB — GLUCOSE, CAPILLARY
Glucose-Capillary: 205 mg/dL — ABNORMAL HIGH (ref 70–99)
Glucose-Capillary: 361 mg/dL — ABNORMAL HIGH (ref 70–99)
Glucose-Capillary: 369 mg/dL — ABNORMAL HIGH (ref 70–99)
Glucose-Capillary: 217 mg/dL — ABNORMAL HIGH (ref 70–99)

## 2023-06-13 MED ORDER — BUDESONIDE 0.5 MG/2ML IN SUSP
0.5000 mg | Freq: Two times a day (BID) | RESPIRATORY_TRACT | Status: DC
  Administered 2023-06-13 – 2023-06-18 (×11): 0.5 mg via RESPIRATORY_TRACT
  Filled 2023-06-13 (×9): qty 2

## 2023-06-13 MED ORDER — CEPHALEXIN 500 MG PO CAPS
500.0000 mg | ORAL_CAPSULE | Freq: Three times a day (TID) | ORAL | Status: DC
  Administered 2023-06-13 – 2023-06-16 (×9): 500 mg via ORAL
  Filled 2023-06-13 (×9): qty 1

## 2023-06-13 MED ORDER — ARFORMOTEROL TARTRATE 15 MCG/2ML IN NEBU
15.0000 ug | INHALATION_SOLUTION | Freq: Two times a day (BID) | RESPIRATORY_TRACT | Status: DC
  Administered 2023-06-13 – 2023-06-18 (×11): 15 ug via RESPIRATORY_TRACT
  Filled 2023-06-13 (×12): qty 2

## 2023-06-13 MED ORDER — ACETYLCYSTEINE 20 % IN SOLN
2.0000 mL | Freq: Three times a day (TID) | RESPIRATORY_TRACT | Status: DC
  Administered 2023-06-13 – 2023-06-18 (×15): 2 mL via RESPIRATORY_TRACT
  Filled 2023-06-13 (×16): qty 4

## 2023-06-13 MED ORDER — IPRATROPIUM-ALBUTEROL 0.5-2.5 (3) MG/3ML IN SOLN
3.0000 mL | RESPIRATORY_TRACT | Status: DC
  Administered 2023-06-13 – 2023-06-14 (×6): 3 mL via RESPIRATORY_TRACT
  Filled 2023-06-13 (×5): qty 3

## 2023-06-13 NOTE — Plan of Care (Signed)
  Problem: Education: Goal: Ability to describe self-care measures that may prevent or decrease complications (Diabetes Survival Skills Education) will improve Outcome: Progressing Goal: Individualized Educational Video(s) Outcome: Progressing   Problem: Coping: Goal: Ability to adjust to condition or change in health will improve Outcome: Progressing   Problem: Fluid Volume: Goal: Ability to maintain a balanced intake and output will improve Outcome: Progressing   Problem: Health Behavior/Discharge Planning: Goal: Ability to identify and utilize available resources and services will improve Outcome: Progressing Goal: Ability to manage health-related needs will improve Outcome: Progressing   Problem: Nutritional: Goal: Maintenance of adequate nutrition will improve Outcome: Progressing Goal: Progress toward achieving an optimal weight will improve Outcome: Progressing   Problem: Skin Integrity: Goal: Risk for impaired skin integrity will decrease Outcome: Progressing   Problem: Tissue Perfusion: Goal: Adequacy of tissue perfusion will improve Outcome: Progressing   Problem: Education: Goal: Ability to demonstrate management of disease process will improve Outcome: Progressing Goal: Ability to verbalize understanding of medication therapies will improve Outcome: Progressing Goal: Individualized Educational Video(s) Outcome: Progressing   Problem: Activity: Goal: Capacity to carry out activities will improve Outcome: Progressing   Problem: Cardiac: Goal: Ability to achieve and maintain adequate cardiopulmonary perfusion will improve Outcome: Progressing   Problem: Education: Goal: Knowledge of disease or condition will improve Outcome: Progressing Goal: Knowledge of the prescribed therapeutic regimen will improve Outcome: Progressing Goal: Individualized Educational Video(s) Outcome: Progressing   Problem: Respiratory: Goal: Ability to maintain a clear airway  will improve Outcome: Progressing Goal: Levels of oxygenation will improve Outcome: Progressing Goal: Ability to maintain adequate ventilation will improve Outcome: Progressing   Problem: Education: Goal: Knowledge of General Education information will improve Description: Including pain rating scale, medication(s)/side effects and non-pharmacologic comfort measures Outcome: Progressing   Problem: Health Behavior/Discharge Planning: Goal: Ability to manage health-related needs will improve Outcome: Progressing   Problem: Activity: Goal: Risk for activity intolerance will decrease Outcome: Progressing   Problem: Clinical Measurements: Goal: Ability to maintain clinical measurements within normal limits will improve Outcome: Progressing Goal: Will remain free from infection Outcome: Progressing Goal: Diagnostic test results will improve Outcome: Progressing Goal: Respiratory complications will improve Outcome: Progressing Goal: Cardiovascular complication will be avoided Outcome: Progressing   Problem: Nutrition: Goal: Adequate nutrition will be maintained Outcome: Progressing   Problem: Elimination: Goal: Will not experience complications related to bowel motility Outcome: Progressing Goal: Will not experience complications related to urinary retention Outcome: Progressing   Problem: Pain Managment: Goal: General experience of comfort will improve Outcome: Progressing   Problem: Safety: Goal: Ability to remain free from injury will improve Outcome: Progressing   Problem: Skin Integrity: Goal: Risk for impaired skin integrity will decrease Outcome: Progressing

## 2023-06-13 NOTE — Plan of Care (Signed)
  Problem: Education: Goal: Ability to describe self-care measures that may prevent or decrease complications (Diabetes Survival Skills Education) will improve Outcome: Progressing   Problem: Coping: Goal: Ability to adjust to condition or change in health will improve Outcome: Progressing   Problem: Metabolic: Goal: Ability to maintain appropriate glucose levels will improve Outcome: Progressing   Problem: Nutritional: Goal: Maintenance of adequate nutrition will improve Outcome: Progressing   Problem: Skin Integrity: Goal: Risk for impaired skin integrity will decrease Outcome: Progressing   Problem: Education: Goal: Ability to demonstrate management of disease process will improve Outcome: Progressing   Problem: Activity: Goal: Ability to tolerate increased activity will improve Outcome: Progressing   Problem: Respiratory: Goal: Ability to maintain a clear airway will improve Outcome: Progressing   Problem: Education: Goal: Knowledge of General Education information will improve Description: Including pain rating scale, medication(s)/side effects and non-pharmacologic comfort measures Outcome: Progressing   Problem: Clinical Measurements: Goal: Will remain free from infection Outcome: Progressing Goal: Diagnostic test results will improve Outcome: Progressing Goal: Respiratory complications will improve Outcome: Progressing

## 2023-06-13 NOTE — Progress Notes (Signed)
Progress Note   Patient: Madison [name] ZOX:[id number] DOB: [birth year] DOA: 06/10/2023     3 DOS: the patient was seen and examined on 06/13/2023   Brief hospital course: Madison [name] is a 72 y.o. female with medical history significant of COPD on prn O2 (up to 4L of O2 at night per pt), dCHF,  HTN, HLD,  DM, A fib on Eliquis, morbid obesity, who presents with shortness of breath.  Upon arriving the hospital, patient has significant bronchospasm.  Patient was diagnosed with COPD exacerbation, started on steroids.  Patient was also placed on BiPAP initially, then transition to 5 L oxygen. Patient condition continue improved, steroid change to oral 10/4.   Principal Problem:   Acute on chronic respiratory failure with hypoxia and hypercapnia (HCC) Active Problems:   COPD exacerbation (HCC)   Acute on chronic diastolic CHF (congestive heart failure) (HCC)   Paroxysmal atrial fibrillation with rapid ventricular response (HCC)   Essential hypertension   Cellulitis of lower extremity   Diabetes mellitus without complication (HCC)   HLD (hyperlipidemia)   Obesity, Class III, BMI 40-49.9 (morbid obesity) (HCC)   Hypokalemia   Assessment and Plan:  Acute on chronic respiratory failure with hypoxia and hypercapnia:  COPD exacerbation Acute on chronic diastolic CHF (congestive heart failure): 2D echo on 01/19/2023 showed EF> 75%.  Patient had a significant respite distress this morning, was on BiPAP.  Received steroids, DuoNeb, abx. Condition continued to improve, was able to take off oxygen today.  Most likely will discharge home tomorrow. Need portable oxygen at home.     Paroxysmal atrial fibrillation with rapid ventricular sponsor. Eliquis and beta-blocker. Patient developed atrial fibrillation again today, with heart rate around 100.  Patient also experienced some left-sided chest pain radiates to the left shoulder.  EKG reviewed showed atrial fibrillation no ST elevation.   Troponin not elevated.  Essential hypertension Continue beta-blocker,   Uncontrolled type 2 diabetes with hyperglycemia: Recent A1c 9.2, poorly controlled.  Patient is taking metformin at home Added insulin glargine as well as scheduled lispro.   HLD: -Lipitor   Obesity, Class III, BMI 40-49.9 (morbid obesity): Body weight 96.2 kg, BMI 41.40 -Encourage to lose some weight, -Healthy diet and exercise   Cellulitis of lower extremity-left lower leg: Condition continued to improve, changed to oral antibiotics, with doxycycline and cephalexin.    Hypokalemia. Resolved.        Subjective:  Patient feels much improved, off oxygen, short of breath better, no wheezes.  But still has significant cough spells.  Physical Exam: Vitals:   06/13/23 0724 06/13/23 0744 06/13/23 1106 06/13/23 1152  BP:  (!) 137/59    Pulse: 77 84  75  Resp: 16 19  (!) 22  Temp:  (!) 97.5 F (36.4 C) 97.9 F (36.6 C)   TempSrc:  Oral Oral   SpO2: 95% 97%  98%  Weight:       General exam: Appears calm and comfortable  Respiratory system: Decreased breathing sounds with wheezes. Respiratory effort normal. Cardiovascular system: Irregular. No JVD, murmurs, rubs, gallops or clicks. No pedal edema. Gastrointestinal system: Abdomen is nondistended, soft and nontender. No organomegaly or masses felt. Normal bowel sounds heard. Central nervous system: Alert and oriented. No focal neurological deficits. Extremities: Symmetric 5 x 5 power. Skin: No rashes, lesions or ulcers Psychiatry: Judgement and insight appear normal. Mood & affect appropriate.    Data Reviewed:  Lab results reviewed.  Family Communication: daughter updated  Disposition: Status is: Inpatient  Remains inpatient appropriate because: Severity of disease, IV treatment.     Time spent: 35 minutes  Author: Marrion Coy, MD 06/13/2023 12:51 PM  For on call review [email or web address].

## 2023-06-14 LAB — GLUCOSE, CAPILLARY
Glucose-Capillary: 175 mg/dL — ABNORMAL HIGH (ref 70–99)
Glucose-Capillary: 307 mg/dL — ABNORMAL HIGH (ref 70–99)
Glucose-Capillary: 251 mg/dL — ABNORMAL HIGH (ref 70–99)
Glucose-Capillary: 168 mg/dL — ABNORMAL HIGH (ref 70–99)

## 2023-06-14 MED ORDER — IPRATROPIUM-ALBUTEROL 0.5-2.5 (3) MG/3ML IN SOLN
3.0000 mL | Freq: Four times a day (QID) | RESPIRATORY_TRACT | Status: DC
  Administered 2023-06-14 – 2023-06-15 (×6): 3 mL via RESPIRATORY_TRACT
  Filled 2023-06-14 (×4): qty 3

## 2023-06-14 NOTE — Progress Notes (Signed)
Pt having difficulty expectorating secretions. Due to mucus plugs and thick secretions. Attempted Flutter Valve (pt unable to activate Flutter properly)  then Mucomyst. Pt continues to have difficulty, will add Metaneb to her regimen.

## 2023-06-14 NOTE — Plan of Care (Signed)
  Problem: Education: Goal: Ability to describe self-care measures that may prevent or decrease complications (Diabetes Survival Skills Education) will improve Outcome: Progressing   Problem: Coping: Goal: Ability to adjust to condition or change in health will improve Outcome: Progressing   Problem: Fluid Volume: Goal: Ability to maintain a balanced intake and output will improve Outcome: Progressing   Problem: Health Behavior/Discharge Planning: Goal: Ability to manage health-related needs will improve Outcome: Progressing   Problem: Metabolic: Goal: Ability to maintain appropriate glucose levels will improve Outcome: Progressing   Problem: Nutritional: Goal: Maintenance of adequate nutrition will improve Outcome: Progressing Goal: Progress toward achieving an optimal weight will improve Outcome: Progressing   Problem: Skin Integrity: Goal: Risk for impaired skin integrity will decrease Outcome: Progressing   Problem: Tissue Perfusion: Goal: Adequacy of tissue perfusion will improve Outcome: Progressing   Problem: Education: Goal: Ability to demonstrate management of disease process will improve Outcome: Progressing Goal: Ability to verbalize understanding of medication therapies will improve Outcome: Progressing   Problem: Activity: Goal: Capacity to carry out activities will improve Outcome: Progressing   Problem: Cardiac: Goal: Ability to achieve and maintain adequate cardiopulmonary perfusion will improve Outcome: Progressing   Problem: Respiratory: Goal: Ability to maintain a clear airway will improve Outcome: Progressing Goal: Levels of oxygenation will improve Outcome: Progressing   Problem: Clinical Measurements: Goal: Will remain free from infection Outcome: Progressing Goal: Diagnostic test results will improve Outcome: Progressing Goal: Respiratory complications will improve Outcome: Progressing   Problem: Activity: Goal: Risk for activity  intolerance will decrease Outcome: Progressing   Problem: Nutrition: Goal: Adequate nutrition will be maintained Outcome: Progressing   Problem: Safety: Goal: Ability to remain free from injury will improve Outcome: Progressing   Problem: Skin Integrity: Goal: Risk for impaired skin integrity will decrease Outcome: Progressing

## 2023-06-14 NOTE — Progress Notes (Addendum)
Pt currently unavailable at this time for respiratory therapy. Will come back by.  2027 - Pt still unavailable at this time, in bed with family bedside and awaiting nurse for pt care.

## 2023-06-14 NOTE — Progress Notes (Signed)
Progress Note   Patient: Madison [name] ZOX:[id number] DOB: December 04, 1950 DOA: 06/10/2023     4 DOS: the patient was seen and examined on 06/14/2023   Brief hospital course: Kodi Scherger is a 72 y.o. female with medical history significant of COPD on prn O2 (up to 4L of O2 at night per pt), dCHF,  HTN, HLD,  DM, A fib on Eliquis, morbid obesity, who presents with shortness of breath.  Upon arriving the hospital, patient has significant bronchospasm.  Patient was diagnosed with COPD exacerbation, started on steroids.  Patient was also placed on BiPAP initially, then transition to 5 L oxygen. Patient condition continue improved, steroid change to oral 10/4.   Principal Problem:   Acute on chronic respiratory failure with hypoxia and hypercapnia (HCC) Active Problems:   COPD exacerbation (HCC)   Acute on chronic diastolic CHF (congestive heart failure) (HCC)   Paroxysmal atrial fibrillation with rapid ventricular response (HCC)   Essential hypertension   Cellulitis of lower extremity   Diabetes mellitus without complication (HCC)   HLD (hyperlipidemia)   Obesity, Class III, BMI 40-49.9 (morbid obesity) (HCC)   Hypokalemia   Assessment and Plan: Acute on chronic respiratory failure with hypoxia and hypercapnia:  COPD exacerbation Acute on chronic diastolic CHF (congestive heart failure): 2D echo on 01/19/2023 showed EF> 75%.  Patient had a significant respite distress this morning, was on BiPAP.  Received steroids, DuoNeb, abx. Patient oxygenation is better, but still has significant cough very thick mucus.  Started on Mucomyst yesterday, still has difficulty coughing up mucus. Keep patient for another day, likely discharge home tomorrow.     Paroxysmal atrial fibrillation with rapid ventricular sponsor. Eliquis and beta-blocker. Patient developed atrial fibrillation again today, with heart rate around 100.  Patient also experienced some left-sided chest pain radiates to the left  shoulder.  EKG reviewed showed atrial fibrillation no ST elevation.  Troponin not elevated.   Essential hypertension Continue beta-blocker,   Uncontrolled type 2 diabetes with hyperglycemia: Recent A1c 9.2, poorly controlled.  Patient is taking metformin at home Continue insulin glargine, scheduled NovoLog and sliding scale insulin.  Glucose gradually coming down with reduced steroid dose.   HLD: -Lipitor   Obesity, Class III, BMI 40-49.9 (morbid obesity): Body weight 96.2 kg, BMI 41.40 -Encourage to lose some weight, -Healthy diet and exercise   Cellulitis of lower extremity-left lower leg: Condition continued to improve, changed to oral antibiotics, with doxycycline and cephalexin.    Hypokalemia. Resolved.        Subjective:  Patient still has significant cough spells, could not catch breath when she coughs.  Very thick mucus.  Physical Exam: Vitals:   06/14/23 0310 06/14/23 0500 06/14/23 0742 06/14/23 0750  BP:    (!) 141/75  Pulse:   75 76  Resp:   16 20  Temp: 98.2 F (36.8 C)   97.6 F (36.4 C)  TempSrc: Oral   Oral  SpO2:   95% 94%  Weight:  98.5 kg     General exam: Appears calm and comfortable  Respiratory system: Decreased breathing sounds with wheezes. Respiratory effort normal. Cardiovascular system: S1 & S2 heard, RRR. No JVD, murmurs, rubs, gallops or clicks. No pedal edema. Gastrointestinal system: Abdomen is nondistended, soft and nontender. No organomegaly or masses felt. Normal bowel sounds heard. Central nervous system: Alert and oriented. No focal neurological deficits. Extremities: Symmetric 5 x 5 power. Skin: No rashes, lesions or ulcers Psychiatry: Judgement and insight appear normal. Mood & affect  appropriate.    Data Reviewed:  Lab results reviewed.  Family Communication: None  Disposition: Status is: Inpatient Remains inpatient appropriate because: Severity of disease,     Time spent: 35 minutes  Author: Marrion Coy,  MD 06/14/2023 10:39 AM  For on call review [email or web address].

## 2023-06-15 LAB — GLUCOSE, CAPILLARY
Glucose-Capillary: 171 mg/dL — ABNORMAL HIGH (ref 70–99)
Glucose-Capillary: 418 mg/dL — ABNORMAL HIGH (ref 70–99)
Glucose-Capillary: 193 mg/dL — ABNORMAL HIGH (ref 70–99)
Glucose-Capillary: 227 mg/dL — ABNORMAL HIGH (ref 70–99)

## 2023-06-15 LAB — GLUCOSE, RANDOM: Glucose, Bld: 304 mg/dL — ABNORMAL HIGH (ref 70–99)

## 2023-06-15 MED ORDER — INSULIN GLARGINE-YFGN 100 UNIT/ML ~~LOC~~ SOLN
16.0000 [IU] | Freq: Every day | SUBCUTANEOUS | Status: DC
  Administered 2023-06-16 – 2023-06-18 (×3): 16 [IU] via SUBCUTANEOUS
  Filled 2023-06-15 (×3): qty 0.16

## 2023-06-15 MED ORDER — INSULIN ASPART 100 UNIT/ML IJ SOLN
24.0000 [IU] | Freq: Once | INTRAMUSCULAR | Status: AC
  Administered 2023-06-15: 24 [IU] via SUBCUTANEOUS
  Filled 2023-06-15: qty 1

## 2023-06-15 MED ORDER — ALUM & MAG HYDROXIDE-SIMETH 200-200-20 MG/5ML PO SUSP
15.0000 mL | ORAL | Status: DC | PRN
  Administered 2023-06-15 – 2023-06-16 (×2): 15 mL via ORAL
  Filled 2023-06-15 (×2): qty 30

## 2023-06-15 MED ORDER — IPRATROPIUM-ALBUTEROL 0.5-2.5 (3) MG/3ML IN SOLN
3.0000 mL | Freq: Three times a day (TID) | RESPIRATORY_TRACT | Status: DC
  Administered 2023-06-16 – 2023-06-18 (×8): 3 mL via RESPIRATORY_TRACT
  Filled 2023-06-15 (×7): qty 3

## 2023-06-15 NOTE — Care Management Important Message (Signed)
Important Message  Patient Details  Name: Madison [name] MRN: [id number] Date of Birth: 1951/07/14   Important Message Given:  Yes - Medicare IM     Bernadette Hoit 06/15/2023, 3:43 PM

## 2023-06-15 NOTE — Progress Notes (Signed)
Progress Note   Patient: Madison [name] [id number]:[id number] DOB: [birth year] DOA: 06/10/2023     5 DOS: the patient was seen and examined on 06/15/2023   Brief hospital course: Javen Marius is a 72 y.o. female with medical history significant of COPD on prn O2 (up to 4L of O2 at night per pt), dCHF,  HTN, HLD,  DM, A fib on Eliquis, morbid obesity, who presents with shortness of breath.  Upon arriving the hospital, patient has significant bronchospasm.  Patient was diagnosed with COPD exacerbation, started on steroids.  Patient was also placed on BiPAP initially, then transition to 5 L oxygen. Patient condition continue improved, steroid change to oral 10/4.   Principal Problem:   Acute on chronic respiratory failure with hypoxia and hypercapnia (HCC) Active Problems:   COPD exacerbation (HCC)   Acute on chronic diastolic CHF (congestive heart failure) (HCC)   Paroxysmal atrial fibrillation with rapid ventricular response (HCC)   Essential hypertension   Cellulitis of lower extremity   Diabetes mellitus without complication (HCC)   HLD (hyperlipidemia)   Obesity, Class III, BMI 40-49.9 (morbid obesity) (HCC)   Hypokalemia   Assessment and Plan: Acute on chronic respiratory failure with hypoxia and hypercapnia:  COPD exacerbation Acute on chronic diastolic CHF (congestive heart failure): 2D echo on 01/19/2023 showed EF> 75%.  Patient had a significant respite distress this morning, was on BiPAP.  Received steroids, DuoNeb, abx. Patient oxygenation is better, but still has significant cough very thick mucus.  Started on Mucomyst, starting cough up large amount of mucus and mucosal plugs.  Patient will benefit for another day of treatment. Continue oral steroids.   Paroxysmal atrial fibrillation with rapid ventricular sponsor. Eliquis and beta-blocker. Patient developed atrial fibrillation again today, with heart rate around 100.  Patient also experienced some left-sided chest pain  radiates to the left shoulder.  EKG reviewed showed atrial fibrillation no ST elevation.  Troponin not elevated.   Essential hypertension Continue beta-blocker,   Uncontrolled type 2 diabetes with hyperglycemia: Recent A1c 9.2, poorly controlled.  Patient is taking metformin at home Continue insulin glargine, scheduled NovoLog and sliding scale insulin.  Glucose gradually coming down with reduced steroid dose. However, patient ate a high carb breakfast morning, glucose running above 400, give a high dose of NovoLog.   HLD: -Lipitor   Obesity, Class III, BMI 40-49.9 (morbid obesity): Body weight 96.2 kg, BMI 41.40 -Encourage to lose some weight, -Healthy diet and exercise   Cellulitis of lower extremity-left lower leg: Condition continued to improve, changed to oral antibiotics, with doxycycline and cephalexin.    Hypokalemia. Resolved.         Subjective:  Patient feels much better today, no longer has any cough spells.  Still cough a large amount of mucus including very thick mucous plugs.  Physical Exam: Vitals:   06/15/23 0153 06/15/23 0400 06/15/23 0829 06/15/23 1121  BP:  139/65 (!) 141/92 (!) 146/92  Pulse:  65 83 67  Resp:  18 (!) 22 18  Temp:  98.6 F (37 C) 97.8 F (36.6 C) 98.4 F (36.9 C)  TempSrc:  Oral Oral Oral  SpO2: 99% 98% 96% 96%  Weight:       General exam: Appears calm and comfortable  Respiratory system: Decreased breathing sounds. Respiratory effort normal. Cardiovascular system: S1 & S2 heard, RRR. No JVD, murmurs, rubs, gallops or clicks. No pedal edema. Gastrointestinal system: Abdomen is nondistended, soft and nontender. No organomegaly or masses felt. Normal bowel sounds  heard. Central nervous system: Alert and oriented. No focal neurological deficits. Extremities: Symmetric 5 x 5 power. Skin: No rashes, lesions or ulcers Psychiatry: Judgement and insight appear normal. Mood & affect appropriate.    Data Reviewed:  Lab results  reviewed.  Family Communication: None  Disposition: Status is: Inpatient Remains inpatient appropriate because: Severity of disease,     Time spent: 35 minutes  Author: Marrion Coy, MD 06/15/2023 12:54 PM  For on call review [email or web address].

## 2023-06-16 DIAGNOSIS — L03116 Cellulitis of left lower limb: Secondary | ICD-10-CM

## 2023-06-16 LAB — CULTURE, BLOOD (ROUTINE X 2)
Culture: NO GROWTH
Culture: NO GROWTH
Special Requests: ADEQUATE
Special Requests: ADEQUATE

## 2023-06-16 LAB — GLUCOSE, CAPILLARY
Glucose-Capillary: 207 mg/dL — ABNORMAL HIGH (ref 70–99)
Glucose-Capillary: 238 mg/dL — ABNORMAL HIGH (ref 70–99)
Glucose-Capillary: 141 mg/dL — ABNORMAL HIGH (ref 70–99)
Glucose-Capillary: 164 mg/dL — ABNORMAL HIGH (ref 70–99)
Glucose-Capillary: 224 mg/dL — ABNORMAL HIGH (ref 70–99)

## 2023-06-16 MED ORDER — PREDNISONE 20 MG PO TABS
20.0000 mg | ORAL_TABLET | Freq: Every day | ORAL | Status: DC
  Administered 2023-06-16 – 2023-06-18 (×3): 20 mg via ORAL
  Filled 2023-06-16 (×3): qty 1

## 2023-06-16 NOTE — Progress Notes (Signed)
Progress Note   Patient: Madison [name] WUJ:[id number] DOB: [birth year] DOA: 06/10/2023     6 DOS: the patient was seen and examined on 06/16/2023   Brief hospital course: Dareli Izzard is a 72 y.o. female with medical history significant of COPD on prn O2 (up to 4L of O2 at night per pt), dCHF,  HTN, HLD,  DM, A fib on Eliquis, morbid obesity, who presents with shortness of breath.  Upon arriving the hospital, patient has significant bronchospasm.  Patient was diagnosed with COPD exacerbation, started on steroids.  Patient was also placed on BiPAP initially, then transition to 5 L oxygen. Patient condition continue improved, steroid change to oral 10/4. Patient condition has improved significantly, but still has significant mucus production and cough spells.  May need additional 2 days with Mucomyst and bronchodilators before discharge.   Principal Problem:   Acute on chronic respiratory failure with hypoxia and hypercapnia (HCC) Active Problems:   COPD exacerbation (HCC)   Acute on chronic diastolic CHF (congestive heart failure) (HCC)   Paroxysmal atrial fibrillation with rapid ventricular response (HCC)   Essential hypertension   Cellulitis of lower extremity   Diabetes mellitus without complication (HCC)   HLD (hyperlipidemia)   Obesity, Class III, BMI 40-49.9 (morbid obesity) (HCC)   Hypokalemia   Assessment and Plan: Acute on chronic respiratory failure with hypoxia and hypercapnia:  COPD exacerbation Acute on chronic diastolic CHF (congestive heart failure): 2D echo on 01/19/2023 showed EF> 75%.  Patient had a significant respite distress this morning, was on BiPAP.  Received steroids, DuoNeb, abx. Also received IV Lasix which has been discontinued. Patient oxygenation is better, but still has significant cough very thick mucus.  Started on Mucomyst, starting cough up large amount of mucus and mucosal plugs.  Currently off oxygen, but still has significant cough spells.   Will continue Mucomyst, scheduled bronchodilator.  But reduce prednisone to 20 mg daily.  Anticipating discharge in 2 days.     Paroxysmal atrial fibrillation with rapid ventricular sponsor. Eliquis and beta-blocker. Patient developed atrial fibrillation again today, with heart rate around 100.  Patient also experienced some left-sided chest pain radiates to the left shoulder.  EKG reviewed showed atrial fibrillation no ST elevation.  Troponin not elevated.   Essential hypertension Continue beta-blocker,   Uncontrolled type 2 diabetes with hyperglycemia: Recent A1c 9.2, poorly controlled.  Patient is taking metformin at home Continue scheduled long-acting insulin, NovoLog and sliding scale insulin.  Wean down steroids to 20 mg of prednisone daily.   HLD: -Lipitor   Obesity, Class III, BMI 40-49.9 (morbid obesity): Body weight 96.2 kg, BMI 41.40 -Encourage to lose some weight, -Healthy diet and exercise   Cellulitis of lower extremity-left lower leg: Condition improved, completed 5 days antibiotics.  Discontinue antibiotics.    Hypokalemia. Resolved.         Subjective:  Patient still has significant cough spells, with large amount of mucus and mucous plugs. Short of breath with duration.  But off oxygen.  Physical Exam: Vitals:   06/16/23 0445 06/16/23 0500 06/16/23 0806 06/16/23 1204  BP: (!) 141/69  (!) 138/56 (!) 147/64  Pulse: 67  72 67  Resp: (!) 22  16 16   Temp: 98 F (36.7 C)  98.5 F (36.9 C) 97.7 F (36.5 C)  TempSrc: Oral  Oral Oral  SpO2: 96%  97% 99%  Weight:  96.4 kg     General exam: Appears calm and comfortable, morbid obese. Respiratory system: Decreased breath sounds.  Respiratory effort normal. Cardiovascular system: S1 & S2 heard, RRR. No JVD, murmurs, rubs, gallops or clicks. No pedal edema. Gastrointestinal system: Abdomen is nondistended, soft and nontender. No organomegaly or masses felt. Normal bowel sounds heard. Central nervous system:  Alert and oriented. No focal neurological deficits. Extremities: Symmetric 5 x 5 power. Skin: No rashes, lesions or ulcers Psychiatry: Judgement and insight appear normal. Mood & affect appropriate.    Data Reviewed:  Lab results reviewed.  Family Communication: None  Disposition: Status is: Inpatient Remains inpatient appropriate because: Severity of disease.     Time spent: 35 minutes  Author: Marrion Coy, MD 06/16/2023 1:05 PM  For on call review [email or web address].

## 2023-06-17 LAB — GLUCOSE, CAPILLARY
Glucose-Capillary: 112 mg/dL — ABNORMAL HIGH (ref 70–99)
Glucose-Capillary: 345 mg/dL — ABNORMAL HIGH (ref 70–99)
Glucose-Capillary: 267 mg/dL — ABNORMAL HIGH (ref 70–99)
Glucose-Capillary: 235 mg/dL — ABNORMAL HIGH (ref 70–99)

## 2023-06-17 MED ORDER — ZINC OXIDE 40 % EX OINT
TOPICAL_OINTMENT | CUTANEOUS | Status: DC | PRN
  Filled 2023-06-17: qty 113

## 2023-06-17 NOTE — Progress Notes (Signed)
Progress Note   Patient: Madison [name] NWG:[id number] DOB: 16-Nov-1950 DOA: 06/10/2023     7 DOS: the patient was seen and examined on 06/17/2023   Brief hospital course: 72yo with medical history significant of COPD on prn O2 (up to 4L qhs), chronic diastolic CHF,  HTN, HLD,  DM, A fib on Eliquis, and morbid obesity who presented on 10/2 with SOB, diagnosed with COPD exacerbation, started on steroids.  Patient was also placed on BiPAP initially -> 5 L [place] O2.  Patient condition continue improved, steroid change to oral 10/4, but still with significant mucus production and coughing spells.    Assessment and Plan:  Acute on chronic respiratory failure associated with a COPD exacerbation Patient presented with SOB, respiratory distress requiring BIPAP She has improved significantly but continues to have periodic cough and mucus production She is now comfortable on RA and does not appear to need ongoing hospitalization She has home O2 for use if needed Chest x-ray is not consistent with pneumonia She appears to be stable for dc to home today but patient does not feel ready - will plan to dc tomorrow and patient/daughter made aware Nebulizers: scheduled Duoneb and prn albuterol Continue Mucomyst and aformoterol nebs Continue Singulair Solu-Medrol  -> Prednisone 20 mg PO daily; 5 days of treatment is thought to be sufficient for treatment of acute COPD exacerbations   Afib Rate controlled with metoprolol Continue Eliquis  Resume Lasix once daily   HTN Continue metoprolol    DM A1c 9.3 in 01/2023 - poor control Resume Glucophage at dc Glargine added   HLD Continue atorvastatin    LLE Cellulitis Resolved, off abx   Anxiety Declines buspirone Will give a small amount (#10) of lorazepam at time of dc   Morbid obesity Body mass index is 41.51 kg/m Weight loss should be encouraged Outpatient PCP/bariatric medicine f/u encouraged      Consultants: TOC  team RT  Procedures: None  Antibiotics: Cephalexin 10/2-8 Doxycycline 10/2-8  30 Day Unplanned Readmission Risk Score    Flowsheet Row ED to Hosp-Admission (Current) from 06/10/2023 in [facility]  30 Day Unplanned Readmission Risk Score (%) 21.33 Filed at 06/17/2023 0801       This score is the patient's risk of an unplanned readmission within 30 days of being discharged (0 -100%). The score is based on dignosis, age, lab data, medications, orders, and past utilization.   Low:  0-14.9   Medium: 15-21.9   High: 22-29.9   Extreme: 30 and above           Subjective: She was seen right after return to bed from the bathroom. She was on RA and with only mild transient increased WOB after ambulation. Once this resolved, she had no conversational dyspnea and appeared very comfortable. However, when the idea of discharge was broached she began to complain of a band-like tightness around her diaphragm. She and her daughter asked for Ativan but she also reported that she felt like she had a "mucus plug" and requested Mucomyst nebs at home.  She subsequently told nursing staff that she is "not comfortable" with idea of dc today.   Objective: Vitals:   06/17/23 0808 06/17/23 1209  BP: (!) 145/85 (!) 144/78  Pulse: 66 64  Resp: 16   Temp: 97.9 F (36.6 C) 98 F (36.7 C)  SpO2: 100% 99%    Intake/Output Summary (Last 24 hours) at 06/17/2023 1357 Last data filed at 06/17/2023 0800 Gross per 24  hour  Intake 360 ml  Output --  Net 360 ml   Filed Weights   06/10/23 2300 06/14/23 0500 06/16/23 0500  Weight: 96.2 kg 98.5 kg 96.4 kg    Exam:  General:  Appears calm and comfortable and is in NAD, minimal transient increased WOB after ambulation, no conversational dyspnea Eyes:  EOMI, normal lids, iris ENT:  grossly normal hearing, lips & tongue, mmm Neck:  no LAD, masses or thyromegaly Cardiovascular:  RRR, no m/r/g. No LE edema.  Respiratory:   CTA  bilaterally with no wheezes/rales/rhonchi.  Normal respiratory effort. Abdomen:  soft, NT, ND Skin:  no rash or induration seen on limited exam Musculoskeletal:  grossly normal tone BUE/BLE, good ROM, no bony abnormality Psychiatric:  grossly normal mood and affect, speech fluent and appropriate, AOx3 Neurologic:  CN 2-12 grossly intact, moves all extremities in coordinated fashion  Data Reviewed: I have reviewed the patient's lab results since admission.  Pertinent labs for today include:   Glucose 112-345      Family Communication: Daughter was present throughout evaluation  Disposition: Status is: Inpatient Remains inpatient appropriate because: ongoing periodic respiratory discomfort   Time spent: 50 minutes  Unresulted Labs (From admission, onward)     Start     Ordered   06/18/23 0500  CBC with Differential/Platelet  Tomorrow morning,   R        06/17/23 1352   06/18/23 0500  Basic metabolic panel  Tomorrow morning,   R        06/17/23 1352             Author: Jonah Blue, MD 06/17/2023 1:57 PM  For on call review [email or web address].

## 2023-06-17 NOTE — Plan of Care (Signed)
  Problem: Coping: Goal: Ability to adjust to condition or change in health will improve Outcome: Progressing   Problem: Fluid Volume: Goal: Ability to maintain a balanced intake and output will improve Outcome: Progressing   Problem: Health Behavior/Discharge Planning: Goal: Ability to identify and utilize available resources and services will improve Outcome: Progressing Goal: Ability to manage health-related needs will improve Outcome: Progressing   Problem: Metabolic: Goal: Ability to maintain appropriate glucose levels will improve Outcome: Progressing   Problem: Nutritional: Goal: Maintenance of adequate nutrition will improve Outcome: Progressing Goal: Progress toward achieving an optimal weight will improve Outcome: Progressing   Problem: Skin Integrity: Goal: Risk for impaired skin integrity will decrease Outcome: Progressing   Problem: Tissue Perfusion: Goal: Adequacy of tissue perfusion will improve Outcome: Progressing   Problem: Education: Goal: Ability to demonstrate management of disease process will improve Outcome: Progressing

## 2023-06-18 LAB — CBC WITH DIFFERENTIAL/PLATELET
Abs Immature Granulocytes: 0.44 10*3/uL — ABNORMAL HIGH (ref 0.00–0.07)
Basophils Absolute: 0.1 10*3/uL (ref 0.0–0.1)
Basophils Relative: 1 %
Eosinophils Absolute: 0.3 10*3/uL (ref 0.0–0.5)
Eosinophils Relative: 2 %
HCT: 37.5 % (ref 36.0–46.0)
Hemoglobin: 12.6 g/dL (ref 12.0–15.0)
Immature Granulocytes: 3 %
Lymphocytes Relative: 31 %
Lymphs Abs: 4.2 10*3/uL — ABNORMAL HIGH (ref 0.7–4.0)
MCH: 30.7 pg (ref 26.0–34.0)
MCHC: 33.6 g/dL (ref 30.0–36.0)
MCV: 91.2 fL (ref 80.0–100.0)
Monocytes Absolute: 1.2 10*3/uL — ABNORMAL HIGH (ref 0.1–1.0)
Monocytes Relative: 8 %
Neutro Abs: 7.5 10*3/uL (ref 1.7–7.7)
Neutrophils Relative %: 55 %
Platelets: 168 10*3/uL (ref 150–400)
RBC: 4.11 MIL/uL (ref 3.87–5.11)
RDW: 12.5 % (ref 11.5–15.5)
WBC: 13.7 10*3/uL — ABNORMAL HIGH (ref 4.0–10.5)
nRBC: 0 % (ref 0.0–0.2)

## 2023-06-18 LAB — BASIC METABOLIC PANEL
Anion gap: 9 (ref 5–15)
BUN: 20 mg/dL (ref 8–23)
CO2: 30 mmol/L (ref 22–32)
Calcium: 8.6 mg/dL — ABNORMAL LOW (ref 8.9–10.3)
Chloride: 100 mmol/L (ref 98–111)
Creatinine, Ser: 0.86 mg/dL (ref 0.44–1.00)
GFR, Estimated: >60 mL/min (ref >60)
Glucose, Bld: 149 mg/dL — ABNORMAL HIGH (ref 70–99)
Potassium: 3.6 mmol/L (ref 3.5–5.1)
Sodium: 139 mmol/L (ref 135–145)

## 2023-06-18 LAB — GLUCOSE, CAPILLARY
Glucose-Capillary: 115 mg/dL — ABNORMAL HIGH (ref 70–99)
Glucose-Capillary: 367 mg/dL — ABNORMAL HIGH (ref 70–99)

## 2023-06-18 MED ORDER — BLOOD GLUCOSE TEST VI STRP: 1.0000 | ORAL_STRIP | Freq: Three times a day (TID) | 0 refills | Status: AC

## 2023-06-18 MED ORDER — BLOOD GLUCOSE MONITORING SUPPL DEVI: 1.0000 | Freq: Three times a day (TID) | 0 refills | Status: AC

## 2023-06-18 MED ORDER — ACETYLCYSTEINE 20 % IN SOLN: 2.0000 mL | Freq: Three times a day (TID) | RESPIRATORY_TRACT | 12 refills | Status: AC | PRN

## 2023-06-18 MED ORDER — LANCETS MISC: 1.0000 | Freq: Three times a day (TID) | 0 refills | Status: AC

## 2023-06-18 MED ORDER — LANCET DEVICE MISC: 1.0000 | Freq: Three times a day (TID) | 0 refills | Status: AC

## 2023-06-18 MED ORDER — GLUCAGON EMERGENCY 1 MG IJ KIT: 1.0000 mg | PACK | INTRAMUSCULAR | 0 refills | Status: AC | PRN

## 2023-06-18 MED ORDER — PNEUMOCOCCAL 20-VAL CONJ VACC 0.5 ML IM SUSY
0.5000 mL | PREFILLED_SYRINGE | INTRAMUSCULAR | Status: AC | PRN
  Administered 2023-06-18: 0.5 mL via INTRAMUSCULAR

## 2023-06-18 MED ORDER — ARFORMOTEROL TARTRATE 15 MCG/2ML IN NEBU: 15.0000 ug | INHALATION_SOLUTION | Freq: Two times a day (BID) | RESPIRATORY_TRACT | 0 refills | Status: AC

## 2023-06-18 MED ORDER — BUDESONIDE 0.5 MG/2ML IN SUSP: 0.5000 mg | Freq: Two times a day (BID) | RESPIRATORY_TRACT | 0 refills | Status: AC

## 2023-06-18 MED ORDER — PEN NEEDLES 31G X 5 MM MISC: 1.0000 | Freq: Three times a day (TID) | 0 refills | Status: AC

## 2023-06-18 MED ORDER — LORAZEPAM 0.5 MG PO TABS: 0.5000 mg | ORAL_TABLET | Freq: Four times a day (QID) | ORAL | 0 refills | Status: DC | PRN

## 2023-06-18 MED ORDER — INSULIN GLARGINE-YFGN 100 UNIT/ML ~~LOC~~ SOPN: 16.0000 [IU] | PEN_INJECTOR | Freq: Every day | SUBCUTANEOUS | 0 refills | Status: AC

## 2023-06-18 NOTE — Progress Notes (Signed)
Patient discharging home. Alll DC paperwork reviewed and sent with pt. Pt leaving with all personal belongings with daughter via private vehicle. Escorted to exit by this RN

## 2023-06-18 NOTE — Discharge Summary (Signed)
Physician Discharge Summary   Patient: Madison [name] MRN: [id number] DOB: August 30, 1951  Admit date:     06/10/2023  Discharge date: 06/18/23  Discharge Physician: Jonah Blue   PCP: Dione Housekeeper, MD   Recommendations at discharge:   Resume home medications Continue budesonide (Pulmicort) and arformoterol (Brovana) treatments twice and daily and Mucomyst nebulizer treatments three times daily as needed Continue metformin and also start using insulin glargine 16 units daily Follow up with Dr. Zada Finders in 1-2 weeks Follow up with pulmonology when appointment can be scheduled  Discharge Diagnoses: Principal Problem:   Acute on chronic respiratory failure with hypoxia and hypercapnia (HCC) Active Problems:   COPD exacerbation (HCC)   Acute on chronic diastolic CHF (congestive heart failure) (HCC)   Paroxysmal atrial fibrillation with rapid ventricular response (HCC)   Essential hypertension   Cellulitis of lower extremity   Diabetes mellitus without complication (HCC)   HLD (hyperlipidemia)   Obesity, Class III, BMI 40-49.9 (morbid obesity) (HCC)   Hypokalemia    Hospital Course: 72yo with medical history significant of COPD on prn O2 (up to 4L qhs), chronic diastolic CHF,  HTN, HLD,  DM, A fib on Eliquis, and morbid obesity who presented on 10/2 with SOB, diagnosed with COPD exacerbation, started on steroids.  Patient was also placed on BiPAP initially -> 5 L [place] O2.  Patient condition continue improved, steroid change to oral 10/4, but still with significant mucus production and coughing spells.    Assessment and Plan:  Acute on chronic respiratory failure associated with a COPD exacerbation Patient presented with SOB, respiratory distress requiring BIPAP She has improved significantly with less periodic cough and mucus production She is now comfortable on RA and does not appear to need ongoing hospitalization She has home O2 for use if needed Chest x-ray is not  consistent with pneumonia She appears to be stable for dc to home today and now feels ready Nebulizers: scheduled Duoneb and prn albuterol Continue Mucomyst and aformoterol nebs Continue Singulair Solu-Medrol  -> Prednisone 20 mg PO daily; she has completed treatment at this time   Afib Rate controlled with metoprolol Continue Eliquis  Resume Lasix once daily   HTN Continue metoprolol    DM A1c 9.3 in 01/2023 - poor control Resume Glucophage at dc Glargine added   HLD Continue atorvastatin    LLE Cellulitis Resolved, off abx   Anxiety Declines buspirone Will give a small amount (#10) of lorazepam at time of dc   Morbid obesity Body mass index is 41.51 kg/m Weight loss should be encouraged Outpatient PCP/bariatric medicine f/u encouraged          Consultants: TOC team RT   Procedures: None   Antibiotics: Cephalexin 10/2-8 Doxycycline 10/2-8    30 Day Unplanned Readmission Risk Score    Flowsheet Row ED to Hosp-Admission (Current) from 06/10/2023 in [facility]  30 Day Unplanned Readmission Risk Score (%) 24.8 Filed at 06/18/2023 0801       This score is the patient's risk of an unplanned readmission within 30 days of being discharged (0 -100%). The score is based on dignosis, age, lab data, medications, orders, and past utilization.   Low:  0-14.9   Medium: 15-21.9   High: 22-29.9   Extreme: 30 and above         Pain control - [place] Controlled Substance Reporting System database was reviewed. and patient was instructed, not to drive, operate heavy machinery, perform activities at heights,  swimming or participation in water activities or provide baby-sitting services while on Pain, Sleep and Anxiety Medications; until their outpatient Physician has advised to do so again. Also recommended to not to take more than prescribed Pain, Sleep and Anxiety Medications.   Disposition: Home Diet recommendation:  Cardiac and Carb  modified diet DISCHARGE MEDICATION: Allergies as of 06/18/2023       Reactions   Egg-derived Products Anaphylaxis   Egg albumin   Fentanyl Anaphylaxis   Morphine And Codeine    unknown   Spiriva Handihaler [tiotropium Bromide Monohydrate] Other (See Comments)   bronchiospasms   Sulfa Antibiotics Hives   Penicillins Rash   Has patient had a PCN reaction causing immediate rash, facial/tongue/throat swelling, SOB or lightheadedness with hypotension: YES Has patient had a PCN reaction causing severe rash involving mucus membranes or skin necrosis: NO Has patient had a PCN reaction that required hospitalization NO Has patient had a PCN reaction occurring within the last 10 years: NO If all of the above answers are "NO", then may proceed with Cephalosporin use.   Voltaren [diclofenac] Rash        Medication List     TAKE these medications    acetaminophen 500 MG tablet Commonly known as: TYLENOL Take 1,000 mg by mouth every 6 (six) hours as needed for mild pain.   acetylcysteine 20 % nebulizer solution Commonly known as: MUCOMYST Take 2 mLs by nebulization 3 (three) times daily as needed (cough/congestion).   albuterol 108 (90 Base) MCG/ACT inhaler Commonly known as: VENTOLIN HFA Inhale 2 puffs into the lungs every 4 (four) hours as needed for wheezing or shortness of breath.   arformoterol 15 MCG/2ML Nebu Commonly known as: BROVANA Take 2 mLs (15 mcg total) by nebulization 2 (two) times daily.   atorvastatin 20 MG tablet Commonly known as: LIPITOR Take 20 mg by mouth daily.   Blood Glucose Monitoring Suppl Devi 1 each by Does not apply route 3 (three) times daily. May dispense any manufacturer covered by patient's insurance.   BLOOD GLUCOSE TEST STRIPS Strp 1 each by Does not apply route 3 (three) times daily. Use as directed to check blood sugar. May dispense any manufacturer covered by patient's insurance and fits patient's device.   budesonide 0.5 MG/2ML nebulizer  solution Commonly known as: PULMICORT Take 2 mLs (0.5 mg total) by nebulization 2 (two) times daily.   Cholecalciferol 25 MCG (1000 UT) capsule Take 1,000 Units by mouth daily.   cyanocobalamin 500 MCG tablet Commonly known as: VITAMIN B12 Take 500 mcg by mouth daily.   dextromethorphan-guaiFENesin 30-600 MG 12hr tablet Commonly known as: MUCINEX DM Take 1 tablet by mouth 2 (two) times daily.   Eliquis 5 MG Tabs tablet Generic drug: apixaban Take 1 tablet (5 mg total) by mouth every 12 (twelve) hours   famotidine 20 MG tablet Commonly known as: PEPCID Take 20 mg by mouth daily.   fluticasone 50 MCG/ACT nasal spray Commonly known as: FLONASE Place 2 sprays into both nostrils daily.   furosemide 40 MG tablet Commonly known as: LASIX Take 1 tablet (40 mg total) by mouth daily.   Glucagon Emergency 1 MG Kit Inject 1 mg into the skin as needed for up to 2 doses (Severe low blood sugar).   insulin glargine-yfgn 100 UNIT/ML Pen Commonly known as: SEMGLEE Inject 16 Units into the skin daily. May substitute as needed per insurance.   ipratropium-albuterol 0.5-2.5 (3) MG/3ML Soln Commonly known as: DUONEB Take 3 mLs by nebulization every  6 (six) hours as needed (Shortness of breath and wheezing).   Lancet Device Misc 1 each by Does not apply route 3 (three) times daily. May dispense any manufacturer covered by patient's insurance.   Lancets Misc 1 each by Does not apply route 3 (three) times daily. Use as directed to check blood sugar. May dispense any manufacturer covered by patient's insurance and fits patient's device.   loratadine-pseudoephedrine 5-120 MG tablet Commonly known as: CLARITIN-D 12-hour Take 1 tablet by mouth 2 (two) times daily.   LORazepam 0.5 MG tablet Commonly known as: ATIVAN Take 1 tablet (0.5 mg total) by mouth every 6 (six) hours as needed for anxiety.   metFORMIN 750 MG 24 hr tablet Commonly known as: GLUCOPHAGE-XR Take 750 mg by mouth  daily.   metoprolol tartrate 50 MG tablet Commonly known as: LOPRESSOR Take 50 mg by mouth 2 (two) times daily.   montelukast 10 MG tablet Commonly known as: SINGULAIR Take 10 mg by mouth at bedtime.   Pen Needles 31G X 5 MM Misc 1 each by Does not apply route 3 (three) times daily. May dispense any manufacturer covered by patient's insurance.   potassium chloride 10 MEQ tablet Commonly known as: KLOR-CON Take 10 mEq by mouth daily.        Discharge Exam:   Subjective: Feeling better today, ready to go home.  Had a large mucus plug that she coughed up yesterday and the band-like diaphragmatic pain resolved completely without recurrence.   Objective: Vitals:   06/18/23 0826 06/18/23 0915  BP: (!) 149/71   Pulse: 66 67  Resp: 20 18  Temp: 97.6 F (36.4 C)   SpO2: 96% 98%    Intake/Output Summary (Last 24 hours) at 06/18/2023 1253 Last data filed at 06/17/2023 2232 Gross per 24 hour  Intake 120 ml  Output --  Net 120 ml   Filed Weights   06/14/23 0500 06/16/23 0500 06/18/23 0619  Weight: 98.5 kg 96.4 kg 96.7 kg    Exam:   General:  Appears calm and comfortable and is in NAD, sitting up in bedside chair, no conversational dyspnea Eyes:  EOMI, normal lids, iris ENT:  grossly normal hearing, lips & tongue, mmm Neck:  no LAD, masses or thyromegaly Cardiovascular:  RRR, no m/r/g. No LE edema.  Respiratory:   CTA bilaterally with no wheezes/rales/rhonchi.  Normal respiratory effort. Abdomen:  soft, NT, ND Skin:  no rash or induration seen on limited exam Musculoskeletal:  grossly normal tone BUE/BLE, good ROM, no bony abnormality Psychiatric:  grossly normal mood and affect, speech fluent and appropriate, AOx3 Neurologic:  CN 2-12 grossly intact, moves all extremities in coordinated fashion  Data Reviewed: I have reviewed the patient's lab results since admission.  Pertinent labs for today include:   Glucose 149 WBC 13.7    Condition at discharge:  good  The results of significant diagnostics from this hospitalization (including imaging, microbiology, ancillary and laboratory) are listed below for reference.   Imaging Studies: US Venous Img Lower Bilateral (DVT)  Result Date: 06/11/2023 CLINICAL DATA:  Lower extremity cellulitis EXAM: BILATERAL LOWER EXTREMITY VENOUS DOPPLER ULTRASOUND TECHNIQUE: Gray-scale sonography with graded compression, as well as color Doppler and duplex ultrasound were performed to evaluate the lower extremity deep venous systems from the level of the common femoral vein and including the common femoral, femoral, profunda femoral, popliteal and calf veins including the posterior tibial, peroneal and gastrocnemius veins when visible. The superficial great saphenous vein was also interrogated. Spectral Doppler  was utilized to evaluate flow at rest and with distal augmentation maneuvers in the common femoral, femoral and popliteal veins. COMPARISON:  None Available. FINDINGS: RIGHT LOWER EXTREMITY Common Femoral Vein: No evidence of thrombus. Normal compressibility, respiratory phasicity and response to augmentation. Saphenofemoral Junction: No evidence of thrombus. Normal compressibility and flow on color Doppler imaging. Profunda Femoral Vein: No evidence of thrombus. Normal compressibility and flow on color Doppler imaging. Femoral Vein: No evidence of thrombus. Normal compressibility, respiratory phasicity and response to augmentation. Popliteal Vein: No evidence of thrombus. Normal compressibility, respiratory phasicity and response to augmentation. Calf Veins: No evidence of thrombus. Normal compressibility and flow on color Doppler imaging. Superficial Great Saphenous Vein: No evidence of thrombus. Normal compressibility. Venous Reflux:  None. Other Findings:  None. LEFT LOWER EXTREMITY Common Femoral Vein: No evidence of thrombus. Normal compressibility, respiratory phasicity and response to augmentation. Saphenofemoral  Junction: No evidence of thrombus. Normal compressibility and flow on color Doppler imaging. Profunda Femoral Vein: No evidence of thrombus. Normal compressibility and flow on color Doppler imaging. Femoral Vein: No evidence of thrombus. Normal compressibility, respiratory phasicity and response to augmentation. Popliteal Vein: No evidence of thrombus. Normal compressibility, respiratory phasicity and response to augmentation. Calf Veins: No evidence of thrombus. Normal compressibility and flow on color Doppler imaging. Superficial Great Saphenous Vein: No evidence of thrombus. Normal compressibility. Venous Reflux:  None. Other Findings:  None. IMPRESSION: No evidence of deep venous thrombosis in either lower extremity. Electronically Signed   By: Alcide Clever M.D.   On: 06/11/2023 00:53   DG Chest Portable 1 View  Result Date: 06/10/2023 CLINICAL DATA:  Shortness of breath for 2 days EXAM: PORTABLE CHEST 1 VIEW COMPARISON:  01/18/2023 FINDINGS: Cardiac shadow is within normal limits. Aortic calcifications are seen. The lungs are clear bilaterally. No bony abnormality is noted. IMPRESSION: No acute abnormality noted. Electronically Signed   By: Alcide Clever M.D.   On: 06/10/2023 20:49    Microbiology: Results for orders placed or performed during the hospital encounter of 06/10/23  SARS Coronavirus 2 by RT PCR (hospital order, performed in [facility] hospital lab) *cepheid single result test* Anterior Nasal Swab     Status: None   Collection Time: 06/10/23  8:02 PM   Specimen: Anterior Nasal Swab  Result Value Ref Range Status   SARS Coronavirus 2 by RT PCR NEGATIVE NEGATIVE Final    Comment: (NOTE) SARS-CoV-2 target nucleic acids are NOT DETECTED.  The SARS-CoV-2 RNA is generally detectable in upper and lower respiratory specimens during the acute phase of infection. The lowest concentration of SARS-CoV-2 viral copies this assay can detect is 250 copies / mL. A negative result does not preclude  SARS-CoV-2 infection and should not be used as the sole basis for treatment or other patient management decisions.  A negative result may occur with improper specimen collection / handling, submission of specimen other than nasopharyngeal swab, presence of viral mutation(s) within the areas targeted by this assay, and inadequate number of viral copies (<250 copies / mL). A negative result must be combined with clinical observations, patient history, and epidemiological information.  Fact Sheet for Patients:   RoadLapTop.co.za  Fact Sheet for Healthcare Providers: http://kim-miller.com/  This test is not yet approved or  cleared by the [place] FDA and has been authorized for detection and/or diagnosis of SARS-CoV-2 by FDA under an Emergency Use Authorization (EUA).  This EUA will remain in effect (meaning this test can be used) for the duration of the  COVID-19 declaration under Section 564(b)(1) of the Act, 21 U.S.C. section 360bbb-3(b)(1), unless the authorization is terminated or revoked sooner.  Performed at [facility], [address] [place], [place] [address]   Culture, blood (Routine X 2) w Reflex to ID Panel     Status: None   Collection Time: 06/11/23  3:43 AM   Specimen: BLOOD  Result Value Ref Range Status   Specimen Description BLOOD BLOOD LEFT ARM  Final   Special Requests   Final    BOTTLES DRAWN AEROBIC AND ANAEROBIC Blood Culture adequate volume   Culture   Final    NO GROWTH 5 DAYS Performed at [facility], [address], [place], [place] [address]    Report Status 06/16/2023 FINAL  Final  Culture, blood (Routine X 2) w Reflex to ID Panel     Status: None   Collection Time: 06/11/23  3:43 AM   Specimen: BLOOD  Result Value Ref Range Status   Specimen Description BLOOD  RH  Final   Special Requests   Final    BOTTLES DRAWN AEROBIC ONLY Blood Culture adequate volume   Culture   Final     NO GROWTH 5 DAYS Performed at [facility], [address] [place], [place] [address]    Report Status 06/16/2023 FINAL  Final  Expectorated Sputum Assessment w Gram Stain, Rflx to Resp Cult     Status: None   Collection Time: 06/11/23  3:43 AM   Specimen: Sputum  Result Value Ref Range Status   Specimen Description SPUTUM  EXPSU  Final   Special Requests NONE  Final   Sputum evaluation   Final    Sputum specimen not acceptable for testing.  Please recollect.   C/VANESSA [email or web address]  06/11/23 RH Performed at [facility], [address] [place], [place] [address]    Report Status 06/11/2023 FINAL  Final  Expectorated Sputum Assessment w Gram Stain, Rflx to Resp Cult     Status: None   Collection Time: 06/11/23  3:37 PM   Specimen: Sputum  Result Value Ref Range Status   Specimen Description SPUTUM  Final   Special Requests NONE  Final   Sputum evaluation   Final    Sputum specimen not acceptable for testing.  Please recollect.   RESULT CALLED TO, READ BACK BY AND VERIFIED WITH: [facility] 06/11/23 1624 KLW Performed at [facility], [address] [place], [place] [address]    Report Status 06/11/2023 FINAL  Final    Labs: CBC: Recent Labs  Lab 06/12/23 0629 06/18/23 0528  WBC 11.0* 13.7*  NEUTROABS  --  7.5  HGB 12.9 12.6  HCT 38.8 37.5  MCV 92.4 91.2  PLT 210 168   Basic Metabolic Panel: Recent Labs  Lab 06/12/23 0629 06/13/23 0557 06/15/23 1222 06/18/23 0528  NA 138 139  --  139  K 3.2* 3.7  --  3.6  CL 98 98  --  100  CO2 29 31  --  30  GLUCOSE 188* 161* 304* 149*  BUN 23 25*  --  20  CREATININE 0.80 0.79  --  0.86  CALCIUM 8.9 9.0  --  8.6*  MG 2.4  --   --   --    Liver Function Tests: No results for input(s): "AST", "ALT", "ALKPHOS", "BILITOT", "PROT", "ALBUMIN" in the last 168 hours. CBG: Recent Labs  Lab 06/17/23 0809 06/17/23 1211 06/17/23 1553 06/17/23 2052 06/18/23 0826  GLUCAP 112* 345* 267*  235* 115*     Discharge time spent: greater than 30 minutes.  Signed: Jonah Blue, MD [facility] 06/18/2023

## 2023-06-18 NOTE — Care Management Important Message (Signed)
Important Message  Patient Details  Name: Madison [name] MRN: [id number] Date of Birth: 11-22-1950   Important Message Given:  Yes - Medicare IM     Olegario Messier A Malorie Bigford 06/18/2023, 1:54 PM

## 2023-10-01 ENCOUNTER — Emergency Department: Payer: Medicare Other

## 2023-10-01 ENCOUNTER — Inpatient Hospital Stay
Admission: EM | Admit: 2023-10-01 | Discharge: 2023-10-04 | DRG: 189 | Disposition: A | Discharge status: Home / Self Care | Payer: Medicare Other | Attending: Internal Medicine | Admitting: Internal Medicine

## 2023-10-01 ENCOUNTER — Other Ambulatory Visit: Payer: Self-pay

## 2023-10-01 DIAGNOSIS — I480 Paroxysmal atrial fibrillation: Secondary | ICD-10-CM | POA: Diagnosis present

## 2023-10-01 DIAGNOSIS — Z880 Allergy status to penicillin: Secondary | ICD-10-CM

## 2023-10-01 DIAGNOSIS — E1151 Type 2 diabetes mellitus with diabetic peripheral angiopathy without gangrene: Secondary | ICD-10-CM | POA: Diagnosis present

## 2023-10-01 DIAGNOSIS — Z7951 Long term (current) use of inhaled steroids: Secondary | ICD-10-CM | POA: Diagnosis not present

## 2023-10-01 DIAGNOSIS — Z87891 Personal history of nicotine dependence: Secondary | ICD-10-CM

## 2023-10-01 DIAGNOSIS — R000 Tachycardia, unspecified: Secondary | ICD-10-CM | POA: Diagnosis present

## 2023-10-01 DIAGNOSIS — J441 Chronic obstructive pulmonary disease with (acute) exacerbation: Secondary | ICD-10-CM | POA: Diagnosis present

## 2023-10-01 DIAGNOSIS — J189 Pneumonia, unspecified organism: Secondary | ICD-10-CM | POA: Diagnosis present

## 2023-10-01 DIAGNOSIS — J9621 Acute and chronic respiratory failure with hypoxia: Principal | ICD-10-CM | POA: Diagnosis present

## 2023-10-01 DIAGNOSIS — J440 Chronic obstructive pulmonary disease with acute lower respiratory infection: Secondary | ICD-10-CM | POA: Diagnosis present

## 2023-10-01 DIAGNOSIS — I5032 Chronic diastolic (congestive) heart failure: Secondary | ICD-10-CM | POA: Diagnosis present

## 2023-10-01 DIAGNOSIS — I130 Hypertensive heart and chronic kidney disease with heart failure and stage 1 through stage 4 chronic kidney disease, or unspecified chronic kidney disease: Secondary | ICD-10-CM | POA: Diagnosis present

## 2023-10-01 DIAGNOSIS — J9622 Acute and chronic respiratory failure with hypercapnia: Secondary | ICD-10-CM | POA: Diagnosis present

## 2023-10-01 DIAGNOSIS — Z794 Long term (current) use of insulin: Secondary | ICD-10-CM | POA: Diagnosis not present

## 2023-10-01 DIAGNOSIS — E1122 Type 2 diabetes mellitus with diabetic chronic kidney disease: Secondary | ICD-10-CM | POA: Diagnosis present

## 2023-10-01 DIAGNOSIS — E785 Hyperlipidemia, unspecified: Secondary | ICD-10-CM | POA: Diagnosis present

## 2023-10-01 DIAGNOSIS — Z882 Allergy status to sulfonamides status: Secondary | ICD-10-CM

## 2023-10-01 DIAGNOSIS — Z1152 Encounter for screening for COVID-19: Secondary | ICD-10-CM

## 2023-10-01 DIAGNOSIS — Z83438 Family history of other disorder of lipoprotein metabolism and other lipidemia: Secondary | ICD-10-CM

## 2023-10-01 DIAGNOSIS — Z7901 Long term (current) use of anticoagulants: Secondary | ICD-10-CM | POA: Diagnosis not present

## 2023-10-01 DIAGNOSIS — Z79899 Other long term (current) drug therapy: Secondary | ICD-10-CM

## 2023-10-01 DIAGNOSIS — Z8249 Family history of ischemic heart disease and other diseases of the circulatory system: Secondary | ICD-10-CM

## 2023-10-01 DIAGNOSIS — L03116 Cellulitis of left lower limb: Secondary | ICD-10-CM | POA: Diagnosis present

## 2023-10-01 DIAGNOSIS — I2510 Atherosclerotic heart disease of native coronary artery without angina pectoris: Secondary | ICD-10-CM | POA: Diagnosis present

## 2023-10-01 DIAGNOSIS — Z833 Family history of diabetes mellitus: Secondary | ICD-10-CM

## 2023-10-01 DIAGNOSIS — E119 Type 2 diabetes mellitus without complications: Secondary | ICD-10-CM

## 2023-10-01 DIAGNOSIS — Z885 Allergy status to narcotic agent status: Secondary | ICD-10-CM

## 2023-10-01 DIAGNOSIS — Z7984 Long term (current) use of oral hypoglycemic drugs: Secondary | ICD-10-CM | POA: Diagnosis not present

## 2023-10-01 DIAGNOSIS — R0602 Shortness of breath: Secondary | ICD-10-CM | POA: Diagnosis present

## 2023-10-01 DIAGNOSIS — J449 Chronic obstructive pulmonary disease, unspecified: Secondary | ICD-10-CM | POA: Insufficient documentation

## 2023-10-01 DIAGNOSIS — N182 Chronic kidney disease, stage 2 (mild): Secondary | ICD-10-CM | POA: Diagnosis present

## 2023-10-01 DIAGNOSIS — E1165 Type 2 diabetes mellitus with hyperglycemia: Secondary | ICD-10-CM | POA: Diagnosis present

## 2023-10-01 DIAGNOSIS — Z888 Allergy status to other drugs, medicaments and biological substances status: Secondary | ICD-10-CM

## 2023-10-01 LAB — CBC WITH DIFFERENTIAL/PLATELET
Abs Immature Granulocytes: 0.07 10*3/uL (ref 0.00–0.07)
Basophils Absolute: 0.1 10*3/uL (ref 0.0–0.1)
Basophils Relative: 1 %
Eosinophils Absolute: 1.1 10*3/uL — ABNORMAL HIGH (ref 0.0–0.5)
Eosinophils Relative: 11 %
HCT: 42.3 % (ref 36.0–46.0)
Hemoglobin: 13.6 g/dL (ref 12.0–15.0)
Immature Granulocytes: 1 %
Lymphocytes Relative: 27 %
Lymphs Abs: 2.7 10*3/uL (ref 0.7–4.0)
MCH: 30.7 pg (ref 26.0–34.0)
MCHC: 32.2 g/dL (ref 30.0–36.0)
MCV: 95.5 fL (ref 80.0–100.0)
Monocytes Absolute: 0.7 10*3/uL (ref 0.1–1.0)
Monocytes Relative: 7 %
Neutro Abs: 5.2 10*3/uL (ref 1.7–7.7)
Neutrophils Relative %: 53 %
Platelets: 226 10*3/uL (ref 150–400)
RBC: 4.43 MIL/uL (ref 3.87–5.11)
RDW: 14.1 % (ref 11.5–15.5)
WBC: 9.9 10*3/uL (ref 4.0–10.5)
nRBC: 0 % (ref 0.0–0.2)

## 2023-10-01 LAB — BLOOD GAS, ARTERIAL
Acid-Base Excess: 2.8 mmol/L — ABNORMAL HIGH (ref 0.0–2.0)
Bicarbonate: 30.1 mmol/L — ABNORMAL HIGH (ref 20.0–28.0)
Delivery systems: POSITIVE
FIO2: 70 %
Inspiratory PAP: 20 cm[H2O]
Mechanical Rate: 15
O2 Saturation: 99.5 %
Patient temperature: 37
Pressure support: 10 cmH2O
RATE: 31 {breaths}/min
Spontaneous VT: 452 mL
pCO2 arterial: 57 mm[Hg] — ABNORMAL HIGH (ref 32–48)
pH, Arterial: 7.33 — ABNORMAL LOW (ref 7.35–7.45)
pO2, Arterial: 247 mm[Hg] — ABNORMAL HIGH (ref 83–108)

## 2023-10-01 LAB — COMPREHENSIVE METABOLIC PANEL
ALT: 38 U/L (ref 0–44)
AST: 36 U/L (ref 15–41)
Albumin: 3.8 g/dL (ref 3.5–5.0)
Alkaline Phosphatase: 52 U/L (ref 38–126)
Anion gap: 12 (ref 5–15)
BUN: 15 mg/dL (ref 8–23)
CO2: 27 mmol/L (ref 22–32)
Calcium: 9.3 mg/dL (ref 8.9–10.3)
Chloride: 97 mmol/L — ABNORMAL LOW (ref 98–111)
Creatinine, Ser: 0.88 mg/dL (ref 0.44–1.00)
GFR, Estimated: >60 mL/min (ref >60)
Glucose, Bld: 257 mg/dL — ABNORMAL HIGH (ref 70–99)
Potassium: 4.1 mmol/L (ref 3.5–5.1)
Sodium: 136 mmol/L (ref 135–145)
Total Bilirubin: 0.9 mg/dL (ref 0.0–1.2)
Total Protein: 6.9 g/dL (ref 6.5–8.1)

## 2023-10-01 LAB — RESPIRATORY PANEL BY PCR

## 2023-10-01 LAB — PROCALCITONIN: Procalcitonin: 0.11 ng/mL

## 2023-10-01 LAB — PROTIME-INR
INR: 1.1 (ref 0.8–1.2)
Prothrombin Time: 14.5 s (ref 11.4–15.2)

## 2023-10-01 LAB — CBG MONITORING, ED
Glucose-Capillary: 243 mg/dL — ABNORMAL HIGH (ref 70–99)
Glucose-Capillary: 316 mg/dL — ABNORMAL HIGH (ref 70–99)
Glucose-Capillary: 237 mg/dL — ABNORMAL HIGH (ref 70–99)

## 2023-10-01 LAB — RESP PANEL BY RT-PCR (RSV, FLU A&B, COVID)  RVPGX2
Influenza A by PCR: NEGATIVE
Influenza B by PCR: NEGATIVE
Resp Syncytial Virus by PCR: NEGATIVE
SARS Coronavirus 2 by RT PCR: NEGATIVE

## 2023-10-01 LAB — LACTIC ACID, PLASMA
Lactic Acid, Venous: 1.5 mmol/L (ref 0.5–1.9)
Lactic Acid, Venous: 1.7 mmol/L (ref 0.5–1.9)

## 2023-10-01 LAB — BRAIN NATRIURETIC PEPTIDE: B Natriuretic Peptide: 69.1 pg/mL (ref 0.0–100.0)

## 2023-10-01 LAB — APTT: aPTT: 33 s (ref 24–36)

## 2023-10-01 LAB — TROPONIN I (HIGH SENSITIVITY)
Troponin I (High Sensitivity): 7 ng/L (ref <18)
Troponin I (High Sensitivity): 7 ng/L (ref <18)

## 2023-10-01 MED ORDER — LORATADINE-PSEUDOEPHEDRINE ER 5-120 MG PO TB12: 1.0000 | ORAL_TABLET | Freq: Two times a day (BID) | ORAL | Status: DC

## 2023-10-01 MED ORDER — APIXABAN 5 MG PO TABS
5.0000 mg | ORAL_TABLET | Freq: Two times a day (BID) | ORAL | Status: DC
  Administered 2023-10-01 – 2023-10-04 (×6): 5 mg via ORAL
  Filled 2023-10-01 (×6): qty 1

## 2023-10-01 MED ORDER — ONDANSETRON HCL 4 MG/2ML IJ SOLN: 4.0000 mg | Freq: Four times a day (QID) | INTRAMUSCULAR | Status: DC | PRN

## 2023-10-01 MED ORDER — LORATADINE 10 MG PO TABS
5.0000 mg | ORAL_TABLET | Freq: Two times a day (BID) | ORAL | Status: DC
  Administered 2023-10-01 – 2023-10-04 (×6): 5 mg via ORAL
  Filled 2023-10-01 (×7): qty 1

## 2023-10-01 MED ORDER — ALBUTEROL (5 MG/ML) CONTINUOUS INHALATION SOLN
10.0000 mg/h | INHALATION_SOLUTION | Freq: Once | RESPIRATORY_TRACT | Status: DC
  Filled 2023-10-01: qty 20

## 2023-10-01 MED ORDER — PSEUDOEPHEDRINE HCL ER 120 MG PO TB12
120.0000 mg | ORAL_TABLET | Freq: Two times a day (BID) | ORAL | Status: DC
  Administered 2023-10-01 – 2023-10-04 (×6): 120 mg via ORAL
  Filled 2023-10-01 (×8): qty 1

## 2023-10-01 MED ORDER — ONDANSETRON HCL 4 MG PO TABS: 4.0000 mg | ORAL_TABLET | Freq: Four times a day (QID) | ORAL | Status: DC | PRN

## 2023-10-01 MED ORDER — IPRATROPIUM-ALBUTEROL 0.5-2.5 (3) MG/3ML IN SOLN
3.0000 mL | Freq: Once | RESPIRATORY_TRACT | Status: AC
  Administered 2023-10-01: 3 mL via RESPIRATORY_TRACT
  Filled 2023-10-01: qty 3

## 2023-10-01 MED ORDER — PREDNISONE 20 MG PO TABS
40.0000 mg | ORAL_TABLET | Freq: Every day | ORAL | Status: DC
  Administered 2023-10-01 – 2023-10-04 (×4): 40 mg via ORAL
  Filled 2023-10-01 (×4): qty 2

## 2023-10-01 MED ORDER — INSULIN ASPART 100 UNIT/ML IJ SOLN
0.0000 [IU] | Freq: Three times a day (TID) | INTRAMUSCULAR | Status: DC
Start: 2023-10-01 — End: 2023-10-04
  Administered 2023-10-01: 11 [IU] via SUBCUTANEOUS
  Administered 2023-10-01 – 2023-10-03 (×5): 5 [IU] via SUBCUTANEOUS
  Administered 2023-10-03 – 2023-10-04 (×3): 2 [IU] via SUBCUTANEOUS
  Filled 2023-10-01 (×7): qty 1

## 2023-10-01 MED ORDER — DM-GUAIFENESIN ER 30-600 MG PO TB12
1.0000 | ORAL_TABLET | Freq: Two times a day (BID) | ORAL | Status: DC
  Administered 2023-10-01 – 2023-10-04 (×7): 1 via ORAL
  Filled 2023-10-01 (×7): qty 1

## 2023-10-01 MED ORDER — SODIUM CHLORIDE 0.9 % IV SOLN
100.0000 mg | Freq: Once | INTRAVENOUS | Status: AC
  Administered 2023-10-01: 100 mg via INTRAVENOUS
  Filled 2023-10-01: qty 100

## 2023-10-01 MED ORDER — IPRATROPIUM-ALBUTEROL 0.5-2.5 (3) MG/3ML IN SOLN
3.0000 mL | Freq: Four times a day (QID) | RESPIRATORY_TRACT | Status: DC | PRN
Start: 2023-10-01 — End: 2023-10-02
  Administered 2023-10-01 – 2023-10-02 (×3): 3 mL via RESPIRATORY_TRACT
  Filled 2023-10-01 (×3): qty 3

## 2023-10-01 MED ORDER — INSULIN GLARGINE-YFGN 100 UNIT/ML ~~LOC~~ SOPN
16.0000 [IU] | PEN_INJECTOR | Freq: Every day | SUBCUTANEOUS | Status: DC
  Filled 2023-10-01: qty 3

## 2023-10-01 MED ORDER — METOPROLOL TARTRATE 50 MG PO TABS
50.0000 mg | ORAL_TABLET | Freq: Two times a day (BID) | ORAL | Status: DC
  Administered 2023-10-02: 50 mg via ORAL
  Filled 2023-10-01 (×3): qty 1

## 2023-10-01 MED ORDER — FUROSEMIDE 40 MG PO TABS
40.0000 mg | ORAL_TABLET | Freq: Every day | ORAL | Status: DC
  Administered 2023-10-01 – 2023-10-04 (×4): 40 mg via ORAL
  Filled 2023-10-01 (×4): qty 1

## 2023-10-01 MED ORDER — ALBUTEROL SULFATE (2.5 MG/3ML) 0.083% IN NEBU
INHALATION_SOLUTION | RESPIRATORY_TRACT | Status: AC
  Administered 2023-10-01: 10 mg
  Filled 2023-10-01: qty 9

## 2023-10-01 MED ORDER — ACETAMINOPHEN 500 MG PO TABS
1000.0000 mg | ORAL_TABLET | Freq: Four times a day (QID) | ORAL | Status: DC | PRN
  Administered 2023-10-02 – 2023-10-04 (×6): 1000 mg via ORAL
  Filled 2023-10-01 (×6): qty 2

## 2023-10-01 MED ORDER — ARFORMOTEROL TARTRATE 15 MCG/2ML IN NEBU
15.0000 ug | INHALATION_SOLUTION | Freq: Two times a day (BID) | RESPIRATORY_TRACT | Status: DC
Start: 2023-10-01 — End: 2023-10-04
  Administered 2023-10-01 – 2023-10-04 (×6): 15 ug via RESPIRATORY_TRACT
  Filled 2023-10-01 (×8): qty 2

## 2023-10-01 MED ORDER — ACETYLCYSTEINE 20 % IN SOLN
2.0000 mL | Freq: Three times a day (TID) | RESPIRATORY_TRACT | Status: DC | PRN
  Administered 2023-10-01 – 2023-10-04 (×4): 2 mL via RESPIRATORY_TRACT
  Filled 2023-10-01 (×6): qty 4

## 2023-10-01 MED ORDER — MONTELUKAST SODIUM 10 MG PO TABS
10.0000 mg | ORAL_TABLET | Freq: Every day | ORAL | Status: DC
Start: 2023-10-01 — End: 2023-10-04
  Administered 2023-10-01 – 2023-10-03 (×3): 10 mg via ORAL
  Filled 2023-10-01 (×3): qty 1

## 2023-10-01 MED ORDER — FAMOTIDINE 20 MG PO TABS: 20.0000 mg | ORAL_TABLET | Freq: Every day | ORAL | Status: DC

## 2023-10-01 MED ORDER — INSULIN GLARGINE-YFGN 100 UNIT/ML ~~LOC~~ SOLN
16.0000 [IU] | Freq: Every day | SUBCUTANEOUS | Status: DC
  Administered 2023-10-01: 16 [IU] via SUBCUTANEOUS
  Filled 2023-10-01 (×2): qty 0.16

## 2023-10-01 MED ORDER — FLUTICASONE PROPIONATE 50 MCG/ACT NA SUSP
2.0000 | Freq: Every day | NASAL | Status: DC
  Administered 2023-10-04: 2 via NASAL
  Filled 2023-10-01: qty 16

## 2023-10-01 MED ORDER — LORAZEPAM 0.5 MG PO TABS
0.5000 mg | ORAL_TABLET | Freq: Four times a day (QID) | ORAL | Status: DC | PRN
  Administered 2023-10-03 (×2): 0.5 mg via ORAL
  Filled 2023-10-01 (×2): qty 1

## 2023-10-01 MED ORDER — ALBUTEROL SULFATE (2.5 MG/3ML) 0.083% IN NEBU
10.0000 mg/h | INHALATION_SOLUTION | Freq: Once | RESPIRATORY_TRACT | Status: AC
  Administered 2023-10-01: 10 mg/h via RESPIRATORY_TRACT
  Filled 2023-10-01: qty 3

## 2023-10-01 MED ORDER — SODIUM CHLORIDE 0.9 % IV SOLN
1.0000 g | Freq: Once | INTRAVENOUS | Status: AC
  Administered 2023-10-01: 1 g via INTRAVENOUS
  Filled 2023-10-01: qty 10

## 2023-10-01 MED ORDER — BUDESONIDE 0.5 MG/2ML IN SUSP
0.5000 mg | Freq: Two times a day (BID) | RESPIRATORY_TRACT | Status: DC
  Administered 2023-10-01 – 2023-10-04 (×7): 0.5 mg via RESPIRATORY_TRACT
  Filled 2023-10-01 (×7): qty 2

## 2023-10-01 MED ORDER — METFORMIN HCL ER 750 MG PO TB24
750.0000 mg | ORAL_TABLET | Freq: Every day | ORAL | Status: DC
Start: 2023-10-01 — End: 2023-10-04
  Administered 2023-10-01 – 2023-10-04 (×4): 750 mg via ORAL
  Filled 2023-10-01 (×4): qty 1

## 2023-10-01 NOTE — ED Notes (Signed)
Gel placed on bridge of nose and pt provided water while BiPAP off. Pt reports more comfort after gel application and tolerating BiPAP well.

## 2023-10-01 NOTE — ED Provider Notes (Addendum)
 [facility] Provider Note    Event Date/Time   First MD Initiated Contact with Patient 10/01/23 0915     (approximate)   History   Respiratory Distress   HPI  Madison [name] is a 73 y.o. female COPD on 2 L oxygen as needed, CHF, diabetes, A-fib on Eliquis, presenting with shortness of breath.  States that she was short of breath for the last 2 days, worsened today.no chest pain, fever, nausea, vomiting, diarrhea, abdominal pain or urinary symptoms.  Has had a cough but states that is chronic.  Per EMS she was found to be satting in the low 90s on room air, was placed on 5 L oxygen by fire department and was satting at 96% for them.  Per EMS she was tight, diffusely wheezing, tripoding, gave her an albuterol neb as well as a DuoNeb x 1.  Also gave her 2 g of mag as well as 125 mg of Solu-Medrol.  Also placed on CPAP.  Patient had taken 2 albuterol nebs prior to EMS arrival.  On independent review she was admitted for COPD exacerbation in October of last year.  At that time she required BiPAP as well.     Physical Exam   Triage Vital Signs: ED Triage Vitals  Encounter Vitals Group     BP      Systolic BP Percentile      Diastolic BP Percentile      Pulse      Resp      Temp      Temp src      SpO2      Weight      Height      Head Circumference      Peak Flow      Pain Score      Pain Loc      Pain Education      Exclude from Growth Chart     Most recent vital signs: Vitals:   10/01/23 0931 10/01/23 0935  BP:  133/61  Pulse:  (!) 107  Resp:  (!) 31  Temp: (!) 97.4 F (36.3 C)   SpO2:  100%     General: Awake, Sitting up in bed with a CPAP mask CV:  Good peripheral perfusion.  Tachycardic Resp:  Normal effort.  Diffusely wheezy Abd:  No distention.  No soft nontender Other:  Bilateral lower extremity edema that appears symmetrical   ED Results / Procedures / Treatments   Labs (all labs ordered are listed, but only abnormal  results are displayed) Labs Reviewed  COMPREHENSIVE METABOLIC PANEL - Abnormal; Notable for the following components:      Result Value   Chloride 97 (*)    Glucose, Bld 257 (*)    All other components within normal limits  CBC WITH DIFFERENTIAL/PLATELET - Abnormal; Notable for the following components:   Eosinophils Absolute 1.1 (*)    All other components within normal limits  BLOOD GAS, ARTERIAL - Abnormal; Notable for the following components:   pH, Arterial 7.33 (*)    pCO2 arterial 57 (*)    pO2, Arterial 247 (*)    Bicarbonate 30.1 (*)    Acid-Base Excess 2.8 (*)    All other components within normal limits  RESP PANEL BY RT-PCR (RSV, FLU A&B, COVID)  RVPGX2  LACTIC ACID, PLASMA  BRAIN NATRIURETIC PEPTIDE  PROTIME-INR  APTT  LACTIC ACID, PLASMA  TROPONIN I (HIGH SENSITIVITY)  TROPONIN I (HIGH SENSITIVITY)  EKG  Sinus tachycardia with occasional premature ventricular complexes, rate of 105, normal QRS, normal QTc, baseline is wandering but no obvious ST elevations, T wave flattening in V1, not significant change compared to prior   RADIOLOGY Chest x-ray on my interpretation without obvious pneumothorax   PROCEDURES:  Critical Care performed: Yes, see critical care procedure note(s)  Ultrasound ED Thoracic  Date/Time: 10/01/2023 9:32 AM  Performed by: Claybon Jabs, MD Authorized by: Claybon Jabs, MD   Procedure details:    Indications: dyspnea     Left lung pleural:  Visualized   Right lung pleural:  Visualized   Images: not archived     Limitations:  Body habitus Findings:    A-lines noted throughout: identified     B-lines noted throughout: not identified   Right Lung Findings:     right lung sliding Left Lung Findings:     left lung sliding Impression:    Impression: none   Comments:     B-lines on exam Ultrasound ED Echo  Date/Time: 10/01/2023 9:33 AM  Performed by: Claybon Jabs, MD Authorized by: Claybon Jabs, MD   Procedure details:     Indications: dyspnea     Views: subxiphoid and IVC view   Findings:    Pericardium: no pericardial effusion     LV Function: normal (>50% EF)     IVC: normal   Impression:    Impression: normal   .Critical Care  Performed by: Claybon Jabs, MD Authorized by: Claybon Jabs, MD   Critical care provider statement:    Critical care time (minutes):  40   Critical care was necessary to treat or prevent imminent or life-threatening deterioration of the following conditions:  Respiratory failure   Critical care was time spent personally by me on the following activities:  Development of treatment plan with patient or surrogate, discussions with consultants, evaluation of patient's response to treatment, examination of patient, ordering and review of laboratory studies, ordering and review of radiographic studies, ordering and performing treatments and interventions, pulse oximetry, re-evaluation of patient's condition and review of old charts    MEDICATIONS ORDERED IN ED: Medications  doxycycline (VIBRAMYCIN) 100 mg in sodium chloride 0.9 % 250 mL IVPB (100 mg Intravenous New Bag/Given 10/01/23 1033)  albuterol (PROVENTIL,VENTOLIN) solution continuous neb (has no administration in time range)  ipratropium-albuterol (DUONEB) 0.5-2.5 (3) MG/3ML nebulizer solution 3 mL (3 mLs Nebulization Given by Other 10/01/23 0934)  ipratropium-albuterol (DUONEB) 0.5-2.5 (3) MG/3ML nebulizer solution 3 mL (3 mLs Nebulization Given by Other 10/01/23 0933)  cefTRIAXone (ROCEPHIN) 1 g in sodium chloride 0.9 % 100 mL IVPB (0 g Intravenous Stopped 10/01/23 1044)     IMPRESSION / MDM / ASSESSMENT AND PLAN / ED COURSE  I reviewed the triage vital signs and the nursing notes.                              Differential diagnosis includes, but is not limited to, COPD exacerbation, CHF exacerbation, ACS, viral illness, pneumonia, considered PE but patient has no unilateral calf swelling or tenderness, diffusely wheezy,  favor COPD exacerbation his diagnosis.  Currently we will switch her to BiPAP mask, give her 2 more DuoNebs and reassess.  If persistently wheezing will likely need to continuous albuterol.  She was given steroids by EMS.  Will give her doxycycline for COPD exacerbation.  Also get labs, chest x-ray, EKG.  Patient's presentation is  most consistent with acute presentation with potential threat to life or bodily function.  Patient with history of COPD and CHF presenting with COPD exacerbation on BiPAP, was given mag, Solu-Medrol, DuoNeb by EMS, gave additional DuoNebs here.  On reassessment is no longer instruments or tripoding, laying black and sleeping, still wheezy so we will give her continuous albuterol.  Independent review of labs and imaging are noted elsewhere in the chart.  Given her need for BiPAP and her COPD exacerbation and history, patient is at high risk, will need to be admitted for further management.  Shared decision making done with patient and she is agreeable plan.  Consult the hospitalist who is agreeable for plan for admission and will evaluate the patient.  She is admitted.  Clinical Course as of 10/01/23 1055  Thu Oct 01, 2023  0946 Blood gas, arterial(!) pCO2 is elevated pH is just mildly low, ABG obtained while on BIPAP [TT]  0946 pH, Arterial(!): 7.33 [TT]  0947 DG Chest Portable 1 View IMPRESSION: Bibasilar linear opacities, which may reflect atelectasis, aspiration, or pneumonia.  Will add antibiotics to treat pneumonia [TT]  1047 On reassessment patient is sleeping, on the BiPAP, states that she is feeling a lot better with the DuoNebs but still slightly short of breath.  On exam is still wheezing bilaterally.  Will give her continuous albuterol.  Hospitalist consult placed. [TT]  1048 CBC with Differential(!) No leukocytosis, H&H is stable [TT]  1048 Comprehensive metabolic panel(!) Electrolytes not severely deranged, creatinine is normal, LFTs are not elevated. [TT]   1048 Lactic acid, plasma Lactate is normal. [TT]  1048 Troponin I (High Sensitivity) Troponin is normal. [TT]  1048 Brain natriuretic peptide Normal [TT]  1055 Consulted hospitalist who is agreeable plan for admission will evaluate the patient. [TT]    Clinical Course User Index [TT] Jodie Echevaria, Franchot Erichsen, MD     FINAL CLINICAL IMPRESSION(S) / ED DIAGNOSES   Final diagnoses:  COPD exacerbation (HCC)  Pneumonia due to infectious organism, unspecified laterality, unspecified part of lung     Rx / DC Orders   ED Discharge Orders     None        Note:  This document was prepared using Dragon voice recognition software and may include unintentional dictation errors.    Claybon Jabs, MD 10/01/23 1056    Claybon Jabs, MD 12/16/23 [phone]

## 2023-10-01 NOTE — ED Notes (Signed)
Medicated PRN for productive cough.

## 2023-10-01 NOTE — ED Notes (Signed)
Patient placed on pure wick °

## 2023-10-01 NOTE — ED Triage Notes (Signed)
Pt to ED via [facility] for difficulty breathing x2 days. Per EMS pt woke up this AM with difficulty breathing. Per EMS pt has hx of COPD. EMS states that prior to their arrival pt had taken 2 albuterol treatments. EMS reports wheezing throughout all lung fields. Pt wears O2 at home as needed. Pt arrives to ED on CPAP with SPO2 97%   EMS vitals  BP 170/80 RR 35 SPO2 97%  EMS PTA treatment 1 duoneb with EMS  1 albuterol  125mg  Solu medrol  2g Magnesium

## 2023-10-01 NOTE — ED Notes (Addendum)
Pt on stretcher eating dinner tray, given kleenex per request. +productive cough w/ yellow sputum.

## 2023-10-01 NOTE — H&P (Addendum)
History and Physical    Madison [name] ZOX:[id number] DOB: 02-05-1951 DOA: 10/01/2023  PCP: Dione Housekeeper, MD (Confirm with patient/family/NH records and if not entered, this has to be entered at [facility] point of entry) Patient coming from: Home  I have personally briefly reviewed patient's old medical records in [facility]  Chief Complaint: Cough, wheezing, SOB  HPI: Madison [name] is a 73 y.o. female with medical history significant of COPD Gold stage II on as needed O2 up to 4 L nightly, chronic HFpEF, HTN, HLD, IDDM, PAF on Eliquis, morbid obesity, presented with worsening of cough wheezing shortness of breath.  Symptoms started 2 days ago, with new onset of dry cough wheezing and shortness of breath inquiring getting worse despite increasing home O2 and using her breathing medications.  Denies any fever chills no chest pains.  ED Course: Borderline tachycardia, blood pressure 130/60 afebrile, and patient was started on BiPAP.  VBG showed 7 point 3/57/47.  Blood work showed bicarb 27, creatinine 0.8, glucose 257, WBC 9.9.  X-ray showed no acute infiltrates but bilateral lower reviewed atelectasis  Patient was given ceftriaxone and azithromycin  Review of Systems: As per HPI otherwise 14 point review of systems negative.    Past Medical History:  Diagnosis Date   Anemia    Arthritis    Blood transfusion without reported diagnosis    Chronic kidney disease 05/22/2016   urinary retention  has catheter      Edema extremities    Hyperlipidemia    Peripheral vascular disease (HCC)    Pneumonia 11/2013   Hx. pneumonia      Seasonal allergies    Tachycardia     Past Surgical History:  Procedure Laterality Date   ANKLE SURGERY Right    APPENDECTOMY     BACK SURGERY  05/2016   CARPAL TUNNEL RELEASE Left    CHOLECYSTECTOMY     TONSILLECTOMY     TUBAL LIGATION     VEIN LIGATION AND STRIPPING       reports that she quit smoking about 3 years ago. Her smoking  use included cigarettes. She started smoking about 3 years ago. She has a 0.3 pack-year smoking history. She has never used smokeless tobacco. She reports current alcohol use. She reports that she does not use drugs.  Allergies  Allergen Reactions   Egg-Derived Products Anaphylaxis    Egg albumin   Fentanyl Anaphylaxis   Morphine And Codeine     unknown   Spiriva Handihaler [Tiotropium Bromide Monohydrate] Other (See Comments)    bronchiospasms   Sulfa Antibiotics Hives   Penicillins Rash    Has patient had a PCN reaction causing immediate rash, facial/tongue/throat swelling, SOB or lightheadedness with hypotension: YES Has patient had a PCN reaction causing severe rash involving mucus membranes or skin necrosis: NO Has patient had a PCN reaction that required hospitalization NO Has patient had a PCN reaction occurring within the last 10 years: NO If all of the above answers are "NO", then may proceed with Cephalosporin use.   Voltaren [Diclofenac] Rash    Family History  Problem Relation Age of Onset   Heart disease Mother    Hypertension Mother    Hyperlipidemia Mother    Diabetes Brother      Prior to Admission medications   Medication Sig Start Date End Date Taking? Authorizing Provider  acetaminophen (TYLENOL) 500 MG tablet Take 1,000 mg by mouth every 6 (six) hours as needed for mild pain.  [provider]  acetylcysteine (MUCOMYST) 20 % nebulizer solution Take 2 mLs by nebulization 3 (three) times daily as needed (cough/congestion). 06/18/23   Jonah Blue, MD  apixaban (ELIQUIS) 5 MG TABS tablet Take 1 tablet (5 mg total) by mouth every 12 (twelve) hours 01/28/23     arformoterol (BROVANA) 15 MCG/2ML NEBU Take 2 mLs (15 mcg total) by nebulization 2 (two) times daily. 06/18/23   Jonah Blue, MD  Blood Glucose Monitoring Suppl DEVI 1 each by Does not apply route 3 (three) times daily. May dispense any manufacturer covered by patient's insurance. 06/18/23    Jonah Blue, MD  budesonide (PULMICORT) 0.5 MG/2ML nebulizer solution Take 2 mLs (0.5 mg total) by nebulization 2 (two) times daily. 06/18/23 07/18/23  Jonah Blue, MD  Cholecalciferol 25 MCG (1000 UT) capsule Take 1,000 Units by mouth daily.    [provider]  cyanocobalamin (VITAMIN B12) 500 MCG tablet Take 500 mcg by mouth daily.    [provider]  dextromethorphan-guaiFENesin (MUCINEX DM) 30-600 MG 12hr tablet Take 1 tablet by mouth 2 (two) times daily. 05/19/22   Arnetha Courser, MD  famotidine (PEPCID) 20 MG tablet Take 20 mg by mouth daily. 05/15/20   [provider]  fluticasone (FLONASE) 50 MCG/ACT nasal spray Place 2 sprays into both nostrils daily.     [provider]  furosemide (LASIX) 40 MG tablet Take 1 tablet (40 mg total) by mouth daily. 01/23/23 06/11/23  Tresa Moore, MD  Glucagon, rDNA, (GLUCAGON EMERGENCY) 1 MG KIT Inject 1 mg into the skin as needed for up to 2 doses (Severe low blood sugar). 06/18/23   Jonah Blue, MD  Glucose Blood (BLOOD GLUCOSE TEST STRIPS) STRP 1 each by Does not apply route 3 (three) times daily. Use as directed to check blood sugar. May dispense any manufacturer covered by patient's insurance and fits patient's device. 06/18/23   Jonah Blue, MD  insulin glargine-yfgn (SEMGLEE) 100 UNIT/ML Pen Inject 16 Units into the skin daily. May substitute as needed per insurance. 06/18/23   Jonah Blue, MD  Insulin Pen Needle (PEN NEEDLES) 31G X 5 MM MISC 1 each by Does not apply route 3 (three) times daily. May dispense any manufacturer covered by patient's insurance. 06/18/23   Jonah Blue, MD  ipratropium-albuterol (DUONEB) 0.5-2.5 (3) MG/3ML SOLN Take 3 mLs by nebulization every 6 (six) hours as needed (Shortness of breath and wheezing). 05/19/22   Arnetha Courser, MD  Lancet Device MISC 1 each by Does not apply route 3 (three) times daily. May dispense any manufacturer covered by patient's insurance.  06/18/23   Jonah Blue, MD  Lancets MISC 1 each by Does not apply route 3 (three) times daily. Use as directed to check blood sugar. May dispense any manufacturer covered by patient's insurance and fits patient's device. 06/18/23   Jonah Blue, MD  loratadine-pseudoephedrine (CLARITIN-D 12-HOUR) 5-120 MG tablet Take 1 tablet by mouth 2 (two) times daily.    [provider]  LORazepam (ATIVAN) 0.5 MG tablet Take 1 tablet (0.5 mg total) by mouth every 6 (six) hours as needed for anxiety. 06/18/23   Jonah Blue, MD  metFORMIN (GLUCOPHAGE-XR) 750 MG 24 hr tablet Take 750 mg by mouth daily. 05/14/22   [provider]  metoprolol (LOPRESSOR) 50 MG tablet Take 50 mg by mouth 2 (two) times daily.  02/26/16   [provider]  montelukast (SINGULAIR) 10 MG tablet Take 10 mg by mouth at bedtime. 07/03/22 07/03/23  [provider]  potassium chloride (KLOR-CON) 10 MEQ tablet Take 10 mEq by mouth daily. 10/24/21   [provider]    Physical Exam: Vitals:   10/01/23 0926 10/01/23 0931 10/01/23 0935 10/01/23 1030  BP:   133/61 (!) 95/51  Pulse:   (!) 107 (!) 102  Resp:   (!) 31 (!) 24  Temp:  (!) 97.4 F (36.3 C)    TempSrc:  Oral    SpO2:   100% 100%  Weight: 98.9 kg     Height: 5' (1.524 m)       Constitutional: NAD, calm, comfortable Vitals:   10/01/23 0926 10/01/23 0931 10/01/23 0935 10/01/23 1030  BP:   133/61 (!) 95/51  Pulse:   (!) 107 (!) 102  Resp:   (!) 31 (!) 24  Temp:  (!) 97.4 F (36.3 C)    TempSrc:  Oral    SpO2:   100% 100%  Weight: 98.9 kg     Height: 5' (1.524 m)      Eyes: PERRL, lids and conjunctivae normal ENMT: Mucous membranes are moist. Posterior pharynx clear of any exudate or lesions.Normal dentition.  Neck: normal, supple, no masses, no thyromegaly Respiratory: Diminished breathing sound bilaterally, diffused wheezing, no crackles, increasing respiratory effort. No accessory muscle use.  Cardiovascular: Regular  rate and rhythm, no murmurs / rubs / gallops. No extremity edema. 2+ pedal pulses. No carotid bruits.  Abdomen: no tenderness, no masses palpated. No hepatosplenomegaly. Bowel sounds positive.  Musculoskeletal: no clubbing / cyanosis. No joint deformity upper and lower extremities. Good ROM, no contractures. Normal muscle tone.  Skin: no rashes, lesions, ulcers. No induration Neurologic: CN 2-12 grossly intact. Sensation intact, DTR normal. Strength 5/5 in all 4.  Psychiatric: Normal judgment and insight. Alert and oriented x 3. Normal mood.     Labs on Admission: I have personally reviewed following labs and imaging studies  CBC: Recent Labs  Lab 10/01/23 0925  WBC 9.9  NEUTROABS 5.2  HGB 13.6  HCT 42.3  MCV 95.5  PLT 226   Basic Metabolic Panel: Recent Labs  Lab 10/01/23 0925  NA 136  K 4.1  CL 97*  CO2 27  GLUCOSE 257*  BUN 15  CREATININE 0.88  CALCIUM 9.3   GFR: Estimated Creatinine Clearance: 59.2 mL/min (by C-G formula based on SCr of 0.88 mg/dL). Liver Function Tests: Recent Labs  Lab 10/01/23 0925  AST 36  ALT 38  ALKPHOS 52  BILITOT 0.9  PROT 6.9  ALBUMIN 3.8   No results for input(s): "LIPASE", "AMYLASE" in the last 168 hours. No results for input(s): "AMMONIA" in the last 168 hours. Coagulation Profile: Recent Labs  Lab 10/01/23 0925  INR 1.1   Cardiac Enzymes: No results for input(s): "CKTOTAL", "CKMB", "CKMBINDEX", "TROPONINI" in the last 168 hours. BNP (last 3 results) No results for input(s): "PROBNP" in the last 8760 hours. HbA1C: No results for input(s): "HGBA1C" in the last 72 hours. CBG: No results for input(s): "GLUCAP" in the last 168 hours. Lipid Profile: No results for input(s): "CHOL", "HDL", "LDLCALC", "TRIG", "CHOLHDL", "LDLDIRECT" in the last 72 hours. Thyroid Function Tests: No results for input(s): "TSH", "T4TOTAL", "FREET4", "T3FREE", "THYROIDAB" in the last 72 hours. Anemia Panel: No results for input(s):  "VITAMINB12", "FOLATE", "FERRITIN", "TIBC", "IRON", "RETICCTPCT" in the last 72 hours. Urine analysis:    Component Value Date/Time   COLORURINE Yellow 09/27/2014 1820   APPEARANCEUR Hazy 09/27/2014 1820   LABSPEC 1.025 09/27/2014 1820   PHURINE 5.0 09/27/2014 1820  GLUCOSEU Negative 09/27/2014 1820   HGBUR 1+ 09/27/2014 1820   BILIRUBINUR 1+ 09/27/2014 1820   KETONESUR Negative 09/27/2014 1820   PROTEINUR 100 mg/dL 16/06/9603 5409   NITRITE Negative 09/27/2014 1820   LEUKOCYTESUR Negative 09/27/2014 1820    Radiological Exams on Admission: DG Chest Portable 1 View Result Date: 10/01/2023 CLINICAL DATA:  Respiratory distress and shortness of breath EXAM: PORTABLE CHEST 1 VIEW COMPARISON:  Chest radiograph dated 06/10/2023 FINDINGS: Normal lung volumes. Bibasilar linear opacities. No pleural effusion or pneumothorax. The heart size and mediastinal contours are within normal limits. No acute osseous abnormality. IMPRESSION: Bibasilar linear opacities, which may reflect atelectasis, aspiration, or pneumonia. Electronically Signed   By: Agustin Cree M.D.   On: 10/01/2023 09:43    EKG: Independently reviewed. Sinus tachy, no acute ST-T changes.  Assessment/Plan Principal Problem:   COPD (chronic obstructive pulmonary disease) (HCC) Active Problems:   Acute on chronic respiratory failure with hypoxia and hypercapnia (HCC)   COPD exacerbation (HCC)  (please populate well all problems here in Problem List. (For example, if patient is on BP meds at home and you resume or decide to hold them, it is a problem that needs to be her. Same for CAD, COPD, HLD and so on)  Acute on chronic hypoxic and hypercapnic respiratory failure Acute COPD exacerbation -Continue BiPAP support, expect patient coming on BiPAP once respiratory acidosis resolved, expect to be in the next few hours. -Continue p.o. prednisone -Continue ICS and LABA -Albuterol and as needed albuterol -Incentive spirometry and flutter  valve -COVID and flu negative, will check respiratory panel -No significant symptoms or signs of systemic inflammation, will monitor off antibiotics  PAF -In sinus rhythm, continue metoprolol and Eliquis  HTN -Stable, continue with  IIDM -Continue Lantus -Continue metformin  DVT prophylaxis: Eliquis Code Status: Full code Family Communication: None at bedside Disposition Plan: Patient is sick with COPD exacerbation and respiratory acidosis requiring BiPAP support, expect more than 2 midnight hospital stay Consults called: None Admission status: PCU admit   Emeline General MD [facility] Pager [phone]  10/01/2023, 11:11 AM

## 2023-10-02 ENCOUNTER — Inpatient Hospital Stay: Payer: Medicare Other

## 2023-10-02 DIAGNOSIS — I480 Paroxysmal atrial fibrillation: Secondary | ICD-10-CM

## 2023-10-02 DIAGNOSIS — E1169 Type 2 diabetes mellitus with other specified complication: Secondary | ICD-10-CM

## 2023-10-02 DIAGNOSIS — L03116 Cellulitis of left lower limb: Secondary | ICD-10-CM

## 2023-10-02 DIAGNOSIS — J441 Chronic obstructive pulmonary disease with (acute) exacerbation: Secondary | ICD-10-CM

## 2023-10-02 DIAGNOSIS — I5032 Chronic diastolic (congestive) heart failure: Secondary | ICD-10-CM

## 2023-10-02 DIAGNOSIS — J9621 Acute and chronic respiratory failure with hypoxia: Secondary | ICD-10-CM

## 2023-10-02 DIAGNOSIS — J9622 Acute and chronic respiratory failure with hypercapnia: Secondary | ICD-10-CM

## 2023-10-02 LAB — BASIC METABOLIC PANEL
Anion gap: 16 — ABNORMAL HIGH (ref 5–15)
BUN: 16 mg/dL (ref 8–23)
CO2: 25 mmol/L (ref 22–32)
Calcium: 9.4 mg/dL (ref 8.9–10.3)
Chloride: 94 mmol/L — ABNORMAL LOW (ref 98–111)
Creatinine, Ser: 0.92 mg/dL (ref 0.44–1.00)
GFR, Estimated: >60 mL/min (ref >60)
Glucose, Bld: 295 mg/dL — ABNORMAL HIGH (ref 70–99)
Potassium: 4.3 mmol/L (ref 3.5–5.1)
Sodium: 135 mmol/L (ref 135–145)

## 2023-10-02 LAB — CBG MONITORING, ED
Glucose-Capillary: 224 mg/dL — ABNORMAL HIGH (ref 70–99)
Glucose-Capillary: 239 mg/dL — ABNORMAL HIGH (ref 70–99)
Glucose-Capillary: 219 mg/dL — ABNORMAL HIGH (ref 70–99)

## 2023-10-02 LAB — PROCALCITONIN: Procalcitonin: 0.18 ng/mL

## 2023-10-02 LAB — HEMOGLOBIN A1C
Hgb A1c MFr Bld: 8.6 % — ABNORMAL HIGH (ref 4.8–5.6)
Mean Plasma Glucose: 200.12 mg/dL

## 2023-10-02 LAB — GLUCOSE, CAPILLARY: Glucose-Capillary: 194 mg/dL — ABNORMAL HIGH (ref 70–99)

## 2023-10-02 MED ORDER — AZITHROMYCIN 500 MG PO TABS
500.0000 mg | ORAL_TABLET | Freq: Every day | ORAL | Status: AC
  Administered 2023-10-02: 500 mg via ORAL
  Filled 2023-10-02: qty 1

## 2023-10-02 MED ORDER — METOPROLOL TARTRATE 25 MG PO TABS
25.0000 mg | ORAL_TABLET | Freq: Once | ORAL | Status: AC
  Administered 2023-10-02: 25 mg via ORAL
  Filled 2023-10-02: qty 1

## 2023-10-02 MED ORDER — INSULIN GLARGINE-YFGN 100 UNIT/ML ~~LOC~~ SOLN
10.0000 [IU] | Freq: Two times a day (BID) | SUBCUTANEOUS | Status: DC
  Administered 2023-10-02 – 2023-10-04 (×5): 10 [IU] via SUBCUTANEOUS
  Filled 2023-10-02 (×6): qty 0.1

## 2023-10-02 MED ORDER — INSULIN ASPART 100 UNIT/ML IJ SOLN
5.0000 [IU] | Freq: Three times a day (TID) | INTRAMUSCULAR | Status: DC
  Administered 2023-10-02 – 2023-10-04 (×6): 5 [IU] via SUBCUTANEOUS
  Filled 2023-10-02 (×5): qty 1

## 2023-10-02 MED ORDER — METOPROLOL TARTRATE 50 MG PO TABS
75.0000 mg | ORAL_TABLET | Freq: Two times a day (BID) | ORAL | Status: DC
  Administered 2023-10-02 – 2023-10-04 (×4): 75 mg via ORAL
  Filled 2023-10-02 (×4): qty 1

## 2023-10-02 MED ORDER — METHOCARBAMOL 500 MG PO TABS
500.0000 mg | ORAL_TABLET | Freq: Three times a day (TID) | ORAL | Status: DC | PRN
  Administered 2023-10-02 – 2023-10-04 (×5): 500 mg via ORAL
  Filled 2023-10-02 (×5): qty 1

## 2023-10-02 MED ORDER — IPRATROPIUM-ALBUTEROL 0.5-2.5 (3) MG/3ML IN SOLN
3.0000 mL | Freq: Four times a day (QID) | RESPIRATORY_TRACT | Status: DC
  Administered 2023-10-02 – 2023-10-04 (×8): 3 mL via RESPIRATORY_TRACT
  Filled 2023-10-02 (×8): qty 3

## 2023-10-02 MED ORDER — AZITHROMYCIN 250 MG PO TABS
250.0000 mg | ORAL_TABLET | Freq: Every day | ORAL | Status: DC
  Administered 2023-10-03 – 2023-10-04 (×2): 250 mg via ORAL
  Filled 2023-10-02 (×2): qty 1

## 2023-10-02 MED ORDER — VITAMIN B-12 1000 MCG PO TABS
500.0000 ug | ORAL_TABLET | Freq: Every day | ORAL | Status: DC
  Administered 2023-10-02 – 2023-10-04 (×3): 500 ug via ORAL
  Filled 2023-10-02 (×3): qty 1

## 2023-10-02 NOTE — Hospital Course (Addendum)
Taken from H&P.   Madison [name] is a 73 y.o. female with medical history significant of COPD Gold stage II on as needed O2 up to 4 L nightly, chronic HFpEF, HTN, HLD, IDDM, PAF on Eliquis, morbid obesity, presented with worsening of cough wheezing shortness of breath for the past 2 days.  Symptoms continued to get worse despite increasing home oxygen and using multiple breathing treatments at home.  On presentation she had borderline tachycardia, afebrile. VBG showed 7.3/57/47 , Blood work showed bicarb 27, creatinine 0.8, glucose 257, WBC 9.9. X-ray showed no acute infiltrates but bilateral lower reviewed atelectasis. Patient was started on BiPAP in ED and received ceftriaxone and Zithromax.  1/24: Hemodynamically stable on 4 L of oxygen which is her baseline.  CBG elevated, patient is on steroid.  Increasing Semglee to 10 units twice daily and adding 5 units with meal along with SSI.  Respiratory viral panel negative.  COVID and influenza A negative. Significant left lower extremity edema and erythema-starting on ceftriaxone for concern of left lower extremity cellulitis.  Lower extremity venous Doppler was negative for DVT. Persistent sinus tachycardia-increasing the dose of metoprolol to 75 mg BID  1/25: Still little short of breath but continued to improve.  Likely can go home tomorrow.  Improving left lower extremity erythema and edema.  1/26; patient remained hemodynamically stable.  She thinks that she is at her baseline and wants to go home.  Left lower extremity erythema and edema improved.  Patient is being given prednisone and antibiotics for 3 more days to complete the course.  She was instructed to check her blood glucose level carefully as she might need extra doses of insulin while taking steroid.  Patient will continue the rest of her home medications and home oxygen as she is currently on her baseline oxygen requirement.  She need to use oxygen all the time.  Patient need to  follow-up with her primary care provider and pulmonologist for further recommendations.

## 2023-10-02 NOTE — Assessment & Plan Note (Signed)
Prior echocardiogram with normal EF and grade 1 diastolic dysfunction.  History of chronic lower extremity venous congestion -Continue with home Lasix

## 2023-10-02 NOTE — Assessment & Plan Note (Signed)
Patient currently in sinus tachycardia. -Increasing the dose of metoprolol to 75 mg twice daily -Continue with Eliquis

## 2023-10-02 NOTE — ED Notes (Signed)
Pt requesting methocarbamol for back spasms. MD messaged

## 2023-10-02 NOTE — ED Notes (Signed)
Pt requesting to get up. This RN called EDT to provide pt with recliner

## 2023-10-02 NOTE — Inpatient Diabetes Management (Signed)
Inpatient Diabetes Program Recommendations  AACE/ADA: New Consensus Statement on Inpatient Glycemic Control (2015)  Target Ranges:  Prepandial:   less than 140 mg/dL      Peak postprandial:   less than 180 mg/dL (1-2 hours)      Critically ill patients:  140 - 180 mg/dL    Latest Reference Range & Units 10/01/23 11:52 10/01/23 16:55 10/01/23 22:23 10/02/23 07:49  Glucose-Capillary 70 - 99 mg/dL 409 (H)  5 units Novolog  316 (H)  11 units Novolog  16 units Semglee 237 (H) 224 (H)  (H): Data is abnormally high    Home DM Meds: Metformin 750 mg daily     Semglee 24 units daily   Current Orders: Semglee 16 units Daily  Novolog 0-15 units TID  Metformin 750 mg daily   MD- Note pt getting Prednisone 40 mg daily   CBG 224 this AM  Please consider while getting Prednsione:  1. Increase Semglee to 20 units daily  2. Start Novolog Meal Coverage: Novolog 4 units TID with meals HOLD if pt NPO HOLD if pt eats <50% meals    --Will follow patient during hospitalization--  Ambrose Finland RN, MSN, CDCES Diabetes Coordinator Inpatient Glycemic Control Team Team Pager: [phone] (8a-5p)

## 2023-10-02 NOTE — ED Notes (Signed)
Pharmacy messaged in regard to missing semglee

## 2023-10-02 NOTE — Progress Notes (Signed)
Progress Note   Patient: Madison [name] UJW:[id number] DOB: 03-16-1951 DOA: 10/01/2023     1 DOS: the patient was seen and examined on 10/02/2023   Brief hospital course: Taken from H&P.   Tehya Wynn Kernes is a 73 y.o. female with medical history significant of COPD Gold stage II on as needed O2 up to 4 L nightly, chronic HFpEF, HTN, HLD, IDDM, PAF on Eliquis, morbid obesity, presented with worsening of cough wheezing shortness of breath for the past 2 days.  Symptoms continued to get worse despite increasing home oxygen and using multiple breathing treatments at home.  On presentation she had borderline tachycardia, afebrile. VBG showed 7.3/57/47 , Blood work showed bicarb 27, creatinine 0.8, glucose 257, WBC 9.9. X-ray showed no acute infiltrates but bilateral lower reviewed atelectasis. Patient was started on BiPAP in ED and received ceftriaxone and Zithromax.  1/24: Hemodynamically stable on 4 L of oxygen which is her baseline.  CBG elevated, patient is on steroid.  Increasing Semglee to 10 units twice daily and adding 5 units with meal along with SSI.  Respiratory viral panel negative.  COVID and influenza A negative. Significant left lower extremity edema and erythema-starting on ceftriaxone for concern of left lower extremity cellulitis.  Lower extremity venous Doppler was negative for DVT. Persistent sinus tachycardia-increasing the dose of metoprolol to 75 mg BID  Assessment and Plan: * Acute on chronic respiratory failure with hypoxia and hypercapnia (HCC) Likely secondary to COPD exacerbation.  Initially requiring BiPAP now weaned back to baseline oxygen requirement of 4 L.  Patient was using oxygen only as as needed although she was advised to use all the time.  Respiratory panel negative. -Continue with steroid -Add Zithromax -Continue with bronchodilator -Continue with supplemental oxygen -Incentive spirometry and flutter valve -Continue with supportive care  COPD  exacerbation (HCC) Please see above  Cellulitis of left lower extremity Patient with history of chronic venous congestion, worsening edema and erythema of left lower extremity.  Venous Doppler negative for DVT. -Adding ceftriaxone for concern of cellulitis  Chronic diastolic CHF (congestive heart failure) (HCC) Prior echocardiogram with normal EF and grade 1 diastolic dysfunction.  History of chronic lower extremity venous congestion -Continue with home Lasix  Paroxysmal atrial fibrillation (HCC) Patient currently in sinus tachycardia. -Increasing the dose of metoprolol to 75 mg twice daily -Continue with Eliquis  Type 2 diabetes mellitus (HCC) CBG elevated as patient is on steroid. -Increase Semglee to 10 units twice daily -Add 5 units with meal -Continue with SSI   Subjective: Patient was feeling still short of breath but stating that she is much better than before.  Having worsening pain and edema of left lower extremity.  Physical Exam: Vitals:   10/02/23 1211 10/02/23 1230 10/02/23 1330 10/02/23 1400  BP:  (!) 114/56 121/64 113/64  Pulse:  (!) 109 (!) 108 (!) 127  Resp:  (!) 22 (!) 25 (!) 23  Temp: 98 F (36.7 C)     TempSrc: Oral     SpO2:  100% 99% 100%  Weight:      Height:       General.  Morbidly obese lady, in no acute distress. Pulmonary.  Lungs clear bilaterally, normal respiratory effort. CV.  Regular rate and rhythm, no JVD, rub or murmur. Abdomen.  Soft, nontender, nondistended, BS positive. CNS.  Alert and oriented .  No focal neurologic deficit. Extremities.  Sign of chronic venous congestion, 1+ left lower extremity edema and erythema.  Data Reviewed: Prior data  reviewed  Family Communication:   Disposition: Status is: Inpatient Remains inpatient appropriate because: Severity of illness  Planned Discharge Destination: Home  DVT prophylaxis.  Eliquis Time spent: 50 minutes  This record has been created using Manufacturing engineer. Errors have been sought and corrected,but may not always be located. Such creation errors do not reflect on the standard of care.   Author: Arnetha Courser, MD 10/02/2023 2:30 PM  For on call review [email or web address].

## 2023-10-02 NOTE — ED Notes (Signed)
Incontinence care provided, new gown given and linens changed. Pt given blanket for comfort. Medicated PRN.

## 2023-10-02 NOTE — Assessment & Plan Note (Signed)
CBG elevated as patient is on steroid. -Increase Semglee to 10 units twice daily -Add 5 units with meal -Continue with SSI

## 2023-10-02 NOTE — Assessment & Plan Note (Signed)
Likely secondary to COPD exacerbation.  Initially requiring BiPAP now weaned back to baseline oxygen requirement of 4 L.  Patient was using oxygen only as as needed although she was advised to use all the time.  Respiratory panel negative. -Continue with steroid -Add Zithromax -Continue with bronchodilator -Continue with supplemental oxygen -Incentive spirometry and flutter valve -Continue with supportive care

## 2023-10-02 NOTE — ED Notes (Signed)
Medicated PRN for increased productive cough.

## 2023-10-02 NOTE — Assessment & Plan Note (Signed)
Please see above

## 2023-10-02 NOTE — Assessment & Plan Note (Signed)
Patient with history of chronic venous congestion, worsening edema and erythema of left lower extremity.  Venous Doppler negative for DVT. -Adding ceftriaxone for concern of cellulitis

## 2023-10-03 LAB — GLUCOSE, CAPILLARY
Glucose-Capillary: 137 mg/dL — ABNORMAL HIGH (ref 70–99)
Glucose-Capillary: 251 mg/dL — ABNORMAL HIGH (ref 70–99)
Glucose-Capillary: 230 mg/dL — ABNORMAL HIGH (ref 70–99)

## 2023-10-03 MED ORDER — LOPERAMIDE HCL 2 MG PO CAPS
2.0000 mg | ORAL_CAPSULE | ORAL | Status: DC | PRN
  Administered 2023-10-03: 2 mg via ORAL
  Filled 2023-10-03: qty 1

## 2023-10-03 MED ORDER — SODIUM CHLORIDE 0.9 % IV SOLN
2.0000 g | INTRAVENOUS | Status: DC
  Administered 2023-10-03: 2 g via INTRAVENOUS
  Filled 2023-10-03 (×2): qty 20

## 2023-10-03 NOTE — Assessment & Plan Note (Signed)
Likely secondary to COPD exacerbation.  Initially requiring BiPAP now weaned back to baseline oxygen requirement of 4 L.  Patient was using oxygen only as as needed although she was advised to use all the time.  Respiratory panel negative. -Continue with steroid -Continue Zithromax -Continue with bronchodilator -Continue with supplemental oxygen -Incentive spirometry and flutter valve -Continue with supportive care

## 2023-10-03 NOTE — Plan of Care (Signed)

## 2023-10-03 NOTE — Progress Notes (Signed)
Progress Note   Patient: Madison [name] ZOX:[id number] DOB: 24-Feb-1951 DOA: 10/01/2023     2 DOS: the patient was seen and examined on 10/03/2023   Brief hospital course: Taken from H&P.   Fenix Archer Vise is a 73 y.o. female with medical history significant of COPD Gold stage II on as needed O2 up to 4 L nightly, chronic HFpEF, HTN, HLD, IDDM, PAF on Eliquis, morbid obesity, presented with worsening of cough wheezing shortness of breath for the past 2 days.  Symptoms continued to get worse despite increasing home oxygen and using multiple breathing treatments at home.  On presentation she had borderline tachycardia, afebrile. VBG showed 7.3/57/47 , Blood work showed bicarb 27, creatinine 0.8, glucose 257, WBC 9.9. X-ray showed no acute infiltrates but bilateral lower reviewed atelectasis. Patient was started on BiPAP in ED and received ceftriaxone and Zithromax.  1/24: Hemodynamically stable on 4 L of oxygen which is her baseline.  CBG elevated, patient is on steroid.  Increasing Semglee to 10 units twice daily and adding 5 units with meal along with SSI.  Respiratory viral panel negative.  COVID and influenza A negative. Significant left lower extremity edema and erythema-starting on ceftriaxone for concern of left lower extremity cellulitis.  Lower extremity venous Doppler was negative for DVT. Persistent sinus tachycardia-increasing the dose of metoprolol to 75 mg BID  1/25: Still little short of breath but continued to improve.  Likely can go home tomorrow.  Improving left lower extremity erythema and edema  Assessment and Plan: * Acute on chronic respiratory failure with hypoxia and hypercapnia (HCC) Likely secondary to COPD exacerbation.  Initially requiring BiPAP now weaned back to baseline oxygen requirement of 4 L.  Patient was using oxygen only as as needed although she was advised to use all the time.  Respiratory panel negative. -Continue with steroid -Continue  Zithromax -Continue with bronchodilator -Continue with supplemental oxygen -Incentive spirometry and flutter valve -Continue with supportive care  COPD exacerbation (HCC) Please see above  Cellulitis of left lower extremity Patient with history of chronic venous congestion, worsening edema and erythema of left lower extremity.  Venous Doppler negative for DVT.  Started improving -Continue ceftriaxone for concern of cellulitis-will discharge on Keflex  Chronic diastolic CHF (congestive heart failure) (HCC) Prior echocardiogram with normal EF and grade 1 diastolic dysfunction.  History of chronic lower extremity venous congestion -Continue with home Lasix  Paroxysmal atrial fibrillation (HCC) Patient currently in sinus tachycardia. -Increasing the dose of metoprolol to 75 mg twice daily -Continue with Eliquis  Type 2 diabetes mellitus (HCC) CBG elevated as patient is on steroid. -Increase Semglee to 10 units twice daily -Add 5 units with meal -Continue with SSI   Subjective: Patient was still feeling little short of breath but stating that she is slowly improving.  Still feeling little congested and does not think that she is ready to go home.  Physical Exam: Vitals:   10/03/23 0754 10/03/23 0822 10/03/23 1200 10/03/23 1420  BP: 137/75  130/70   Pulse:  78 85 80  Resp: 18 (!) 25 18 20   Temp: 98.2 F (36.8 C)  98 F (36.7 C)   TempSrc: Oral  Oral   SpO2: 100% 98%    Weight:      Height:       General.  Morbidly obese lady, in no acute distress. Pulmonary.  Mild scattered wheeze bilaterally, normal respiratory effort.  Air entry much improved CV.  Regular rate and rhythm, no JVD, rub  or murmur. Abdomen.  Soft, nontender, nondistended, BS positive. CNS.  Alert and oriented .  No focal neurologic deficit. Extremities.  Signs of chronic venous congestion, improving erythema and edema of LLE  Data Reviewed: Prior data reviewed  Family Communication: Discussed with  patient  Disposition: Status is: Inpatient Remains inpatient appropriate because: Severity of illness  Planned Discharge Destination: Home  DVT prophylaxis.  Eliquis Time spent: 45 minutes  This record has been created using Conservation officer, historic buildings. Errors have been sought and corrected,but may not always be located. Such creation errors do not reflect on the standard of care.   Author: Arnetha Courser, MD 10/03/2023 5:04 PM  For on call review [email or web address].

## 2023-10-03 NOTE — Assessment & Plan Note (Addendum)
Patient with history of chronic venous congestion, worsening edema and erythema of left lower extremity.  Venous Doppler negative for DVT.  Started improving -Continue ceftriaxone for concern of cellulitis-will discharge on Keflex

## 2023-10-03 NOTE — Progress Notes (Signed)
PT Cancellation Note  Patient Details Name: Dorothie Wah MRN: [id number] DOB: 19-Aug-1951   Cancelled Treatment:    Reason Eval/Treat Not Completed: PT screened, no needs identified, will sign off. PT orders received and pt chart reviewed. Upon entry into room, pt was in bathroom performing sponge bath from sink in standing position.   PT able to converse with pt while she gave herself a sponge bath in standing. She demonstrates good static standing balance with BUE supported on sink and trunk flexion (washing hair in sink). She demonstrates adequate strength as she is able to stand unsupported with transition from sink>toilet for seated rest break. She demonstrates good global AROM as she is able to dry herself off completely with washcloth. She does present with labored breathing during conversation which is likely secondary to acute on chronic respiratory failure diagnosis and increased O2 needs.  Pt verbalizes that her breathing is getting better and that her mobility is doing well. Pt states she has 2 STE home with railing, but that she normally does fine with this even if she is SOB. She states that she likely would not need PT but is agreeable to have HHPT come out for home eval. Based on pt's current presentation, she would likely benefit from home eval to address home set up concerns for energy conservation and pacing. Pt's daughter present in room and agreeable for this DC recommendation. Daughter states she will be there to assist the pt at DC.    Vira Blanco, PT, DPT 3:25 PM,10/03/23 Physical Therapist - [facility]

## 2023-10-03 NOTE — TOC CM/SW Note (Signed)
Transition of Care [facility]) - Inpatient Brief Assessment   Patient Details  Name: Madison [name] MRN: [id number] Date of Birth: [birth year]  Transition of [facility]) CM/SW Contact:    Rodney Langton, RN Phone Number: 10/03/2023, 2:28 PM   Clinical Narrative:  Brief assessment done, no TOC needs identified at this time.  Consult TOC team if needs arise.   Transition of Care Asessment: Insurance and Status: Insurance coverage has been reviewed Patient has primary care physician: Yes Home environment has been reviewed: Yes Prior level of function:: Independent Prior/Current Home Services: No current home services Social Drivers of Health Review: SDOH reviewed no interventions necessary Readmission risk has been reviewed: Yes Transition of care needs: no transition of care needs at this time

## 2023-10-04 DIAGNOSIS — J189 Pneumonia, unspecified organism: Secondary | ICD-10-CM

## 2023-10-04 LAB — CBC
HCT: 37.3 % (ref 36.0–46.0)
Hemoglobin: 12.3 g/dL (ref 12.0–15.0)
MCH: 30.5 pg (ref 26.0–34.0)
MCHC: 33 g/dL (ref 30.0–36.0)
MCV: 92.6 fL (ref 80.0–100.0)
Platelets: 185 10*3/uL (ref 150–400)
RBC: 4.03 MIL/uL (ref 3.87–5.11)
RDW: 13.5 % (ref 11.5–15.5)
WBC: 9.6 10*3/uL (ref 4.0–10.5)
nRBC: 0 % (ref 0.0–0.2)

## 2023-10-04 MED ORDER — CEPHALEXIN 500 MG PO CAPS: 500.0000 mg | ORAL_CAPSULE | Freq: Three times a day (TID) | ORAL | 0 refills | Status: AC

## 2023-10-04 MED ORDER — AZITHROMYCIN 250 MG PO TABS: 250.0000 mg | ORAL_TABLET | Freq: Every day | ORAL | 0 refills | Status: AC

## 2023-10-04 MED ORDER — METOPROLOL TARTRATE 75 MG PO TABS: 75.0000 mg | ORAL_TABLET | Freq: Two times a day (BID) | ORAL | 1 refills | Status: AC

## 2023-10-04 MED ORDER — PREDNISONE 20 MG PO TABS: 40.0000 mg | ORAL_TABLET | Freq: Every day | ORAL | 0 refills | Status: AC

## 2023-10-04 NOTE — Plan of Care (Signed)

## 2023-10-04 NOTE — Discharge Summary (Signed)
Physician Discharge Summary   Patient: Madison [name] MRN: [id number] DOB: 12-15-1950  Admit date:     10/01/2023  Discharge date: 10/04/23  Discharge Physician: Arnetha Courser   PCP: Dione Housekeeper, MD   Recommendations at discharge: Please obtain CBC and BMP and follow-up Follow-up with primary care provider Follow-up with pulmonology  Discharge Diagnoses: Principal Problem:   Acute on chronic respiratory failure with hypoxia and hypercapnia (HCC) Active Problems:   COPD exacerbation (HCC)   Cellulitis of left lower extremity   Chronic diastolic CHF (congestive heart failure) (HCC)   Paroxysmal atrial fibrillation (HCC)   Type 2 diabetes mellitus (HCC)   Pneumonia due to infectious organism   Hospital Course: Taken from H&P.   Madison [name] is a 73 y.o. female with medical history significant of COPD Gold stage II on as needed O2 up to 4 L nightly, chronic HFpEF, HTN, HLD, IDDM, PAF on Eliquis, morbid obesity, presented with worsening of cough wheezing shortness of breath for the past 2 days.  Symptoms continued to get worse despite increasing home oxygen and using multiple breathing treatments at home.  On presentation she had borderline tachycardia, afebrile. VBG showed 7.3/57/47 , Blood work showed bicarb 27, creatinine 0.8, glucose 257, WBC 9.9. X-ray showed no acute infiltrates but bilateral lower reviewed atelectasis. Patient was started on BiPAP in ED and received ceftriaxone and Zithromax.  1/24: Hemodynamically stable on 4 L of oxygen which is her baseline.  CBG elevated, patient is on steroid.  Increasing Semglee to 10 units twice daily and adding 5 units with meal along with SSI.  Respiratory viral panel negative.  COVID and influenza A negative. Significant left lower extremity edema and erythema-starting on ceftriaxone for concern of left lower extremity cellulitis.  Lower extremity venous Doppler was negative for DVT. Persistent sinus  tachycardia-increasing the dose of metoprolol to 75 mg BID  1/25: Still little short of breath but continued to improve.  Likely can go home tomorrow.  Improving left lower extremity erythema and edema.  1/26; patient remained hemodynamically stable.  She thinks that she is at her baseline and wants to go home.  Left lower extremity erythema and edema improved.  Patient is being given prednisone and antibiotics for 3 more days to complete the course.  She was instructed to check her blood glucose level carefully as she might need extra doses of insulin while taking steroid.  Patient will continue the rest of her home medications and home oxygen as she is currently on her baseline oxygen requirement.  She need to use oxygen all the time.  Patient need to follow-up with her primary care provider and pulmonologist for further recommendations.  Assessment and Plan: * Acute on chronic respiratory failure with hypoxia and hypercapnia (HCC) Likely secondary to COPD exacerbation.  Initially requiring BiPAP now weaned back to baseline oxygen requirement of 4 L.  Patient was using oxygen only as as needed although she was advised to use all the time.  Respiratory panel negative. -Continue with steroid -Continue Zithromax -Continue with bronchodilator -Continue with supplemental oxygen -Incentive spirometry and flutter valve -Continue with supportive care  COPD exacerbation (HCC) Please see above  Cellulitis of left lower extremity Patient with history of chronic venous congestion, worsening edema and erythema of left lower extremity.  Venous Doppler negative for DVT.  Started improving -Continue ceftriaxone for concern of cellulitis-will discharge on Keflex  Chronic diastolic CHF (congestive heart failure) (HCC) Prior echocardiogram with normal EF and grade 1 diastolic  dysfunction.  History of chronic lower extremity venous congestion -Continue with home Lasix  Paroxysmal atrial fibrillation  (HCC) Patient currently in sinus tachycardia. -Increasing the dose of metoprolol to 75 mg twice daily -Continue with Eliquis  Type 2 diabetes mellitus (HCC) CBG elevated as patient is on steroid. -Increase Semglee to 10 units twice daily -Add 5 units with meal -Continue with SSI   Consultants: None Procedures performed: None Disposition: Home Diet recommendation:  Discharge Diet Orders (From admission, onward)     Start     Ordered   10/04/23 0000  Diet - low sodium heart healthy        10/04/23 1046           Cardiac and Carb modified diet DISCHARGE MEDICATION: Allergies as of 10/04/2023       Reactions   Egg-derived Products Anaphylaxis   Egg albumin   Fentanyl Anaphylaxis   Morphine And Codeine    unknown   Spiriva Handihaler [tiotropium Bromide Monohydrate] Other (See Comments)   bronchiospasms   Sulfa Antibiotics Hives   Voltaren [diclofenac] Rash        Medication List     STOP taking these medications    famotidine 20 MG tablet Commonly known as: PEPCID   LORazepam 0.5 MG tablet Commonly known as: ATIVAN   potassium chloride 10 MEQ tablet Commonly known as: KLOR-CON       TAKE these medications    acetaminophen 500 MG tablet Commonly known as: TYLENOL Take 1,000 mg by mouth every 6 (six) hours as needed for mild pain.   acetylcysteine 20 % nebulizer solution Commonly known as: MUCOMYST Take 2 mLs by nebulization 3 (three) times daily as needed (cough/congestion).   albuterol 108 (90 Base) MCG/ACT inhaler Commonly known as: VENTOLIN HFA Inhale 2 puffs into the lungs every 4 (four) hours as needed for wheezing or shortness of breath.   arformoterol 15 MCG/2ML Nebu Commonly known as: BROVANA Take 2 mLs (15 mcg total) by nebulization 2 (two) times daily.   azithromycin 250 MG tablet Commonly known as: ZITHROMAX Take 1 tablet (250 mg total) by mouth daily for 3 days.   Blood Glucose Monitoring Suppl Devi 1 each by Does not apply  route 3 (three) times daily. May dispense any manufacturer covered by patient's insurance.   BLOOD GLUCOSE TEST STRIPS Strp 1 each by Does not apply route 3 (three) times daily. Use as directed to check blood sugar. May dispense any manufacturer covered by patient's insurance and fits patient's device.   budesonide 0.5 MG/2ML nebulizer solution Commonly known as: PULMICORT Take 2 mLs (0.5 mg total) by nebulization 2 (two) times daily.   cephALEXin 500 MG capsule Commonly known as: KEFLEX Take 1 capsule (500 mg total) by mouth 3 (three) times daily for 3 days.   Cholecalciferol 25 MCG (1000 UT) capsule Take 1,000 Units by mouth daily.   cyanocobalamin 500 MCG tablet Commonly known as: VITAMIN B12 Take 500 mcg by mouth daily.   dextromethorphan-guaiFENesin 30-600 MG 12hr tablet Commonly known as: MUCINEX DM Take 1 tablet by mouth 2 (two) times daily.   Eliquis 5 MG Tabs tablet Generic drug: apixaban Take 1 tablet (5 mg total) by mouth every 12 (twelve) hours   fluticasone 50 MCG/ACT nasal spray Commonly known as: FLONASE Place 2 sprays into both nostrils daily.   furosemide 40 MG tablet Commonly known as: LASIX Take 1 tablet (40 mg total) by mouth daily.   Glucagon Emergency 1 MG Kit Inject 1 mg  into the skin as needed for up to 2 doses (Severe low blood sugar).   insulin glargine-yfgn 100 UNIT/ML Pen Commonly known as: SEMGLEE Inject 16 Units into the skin daily. May substitute as needed per insurance. What changed: how much to take   ipratropium-albuterol 0.5-2.5 (3) MG/3ML Soln Commonly known as: DUONEB Take 3 mLs by nebulization every 6 (six) hours as needed (Shortness of breath and wheezing).   Lancet Device Misc 1 each by Does not apply route 3 (three) times daily. May dispense any manufacturer covered by patient's insurance.   Lancets Misc 1 each by Does not apply route 3 (three) times daily. Use as directed to check blood sugar. May dispense any manufacturer  covered by patient's insurance and fits patient's device.   loratadine-pseudoephedrine 5-120 MG tablet Commonly known as: CLARITIN-D 12-hour Take 1 tablet by mouth 2 (two) times daily.   metFORMIN 750 MG 24 hr tablet Commonly known as: GLUCOPHAGE-XR Take 750 mg by mouth daily.   methocarbamol 750 MG tablet Commonly known as: ROBAXIN Take 750 mg by mouth 3 (three) times daily as needed for muscle spasms.   Metoprolol Tartrate 75 MG Tabs Take 1 tablet (75 mg total) by mouth 2 (two) times daily. What changed:  medication strength how much to take   montelukast 10 MG tablet Commonly known as: SINGULAIR Take 10 mg by mouth at bedtime.   Pen Needles 31G X 5 MM Misc 1 each by Does not apply route 3 (three) times daily. May dispense any manufacturer covered by patient's insurance.   potassium chloride SA 20 MEQ tablet Commonly known as: KLOR-CON M Take 20 mEq by mouth daily.   predniSONE 20 MG tablet Commonly known as: DELTASONE Take 2 tablets (40 mg total) by mouth daily with breakfast for 2 days. Start taking on: October 05, 2023        Follow-up Information     Olmedo, Joycie Peek, MD. Schedule an appointment as soon as possible for a visit in 1 week(s).   Specialty: Family Medicine Contact information: [address] [place] [address] [phone]                Discharge Exam: Ceasar Mons Weights   10/01/23 9147 10/04/23 0524  Weight: 98.9 kg 97 kg   General.  Obese elderly lady, in no acute distress. Pulmonary.  Scattered inspiratory wheeze bilaterally, normal respiratory effort.  Much improved air entry CV.  Regular rate and rhythm, no JVD, rub or murmur. Abdomen.  Soft, nontender, nondistended, BS positive. CNS.  Alert and oriented .  No focal neurologic deficit. Extremities.  Signs of chronic venous congestion, much improved erythema and edema of left lower extremity Psychiatry.  Judgment and insight appears normal.   Condition at discharge:  stable  The results of significant diagnostics from this hospitalization (including imaging, microbiology, ancillary and laboratory) are listed below for reference.   Imaging Studies: US Venous Img Lower Unilateral Left (DVT) Result Date: 10/02/2023 CLINICAL DATA:  Edema.  Redness and swelling EXAM: Left LOWER EXTREMITY VENOUS DOPPLER ULTRASOUND TECHNIQUE: Gray-scale sonography with compression, as well as color and duplex ultrasound, were performed to evaluate the deep venous system(s) from the level of the common femoral vein through the popliteal and proximal calf veins. COMPARISON:  None Available. FINDINGS: VENOUS Normal compressibility of the common femoral, superficial femoral, and popliteal veins, as well as the visualized calf veins. Visualized portions of profunda femoral vein and great saphenous vein unremarkable. No filling defects to suggest DVT on  grayscale or color Doppler imaging. Doppler waveforms show normal direction of venous flow, normal respiratory plasticity and response to augmentation. Limited views of the contralateral common femoral vein are unremarkable. OTHER None. Limitations: none IMPRESSION: No evidence of left lower extremity DVT. Electronically Signed   By: Karen Kays M.D.   On: 10/02/2023 13:53   DG Chest Portable 1 View Result Date: 10/01/2023 CLINICAL DATA:  Respiratory distress and shortness of breath EXAM: PORTABLE CHEST 1 VIEW COMPARISON:  Chest radiograph dated 06/10/2023 FINDINGS: Normal lung volumes. Bibasilar linear opacities. No pleural effusion or pneumothorax. The heart size and mediastinal contours are within normal limits. No acute osseous abnormality. IMPRESSION: Bibasilar linear opacities, which may reflect atelectasis, aspiration, or pneumonia. Electronically Signed   By: Agustin Cree M.D.   On: 10/01/2023 09:43    Microbiology: Results for orders placed or performed during the hospital encounter of 10/01/23  Resp panel by RT-PCR (RSV, Flu A&B, Covid)  Anterior Nasal Swab     Status: None   Collection Time: 10/01/23  9:25 AM   Specimen: Anterior Nasal Swab  Result Value Ref Range Status   SARS Coronavirus 2 by RT PCR NEGATIVE NEGATIVE Final    Comment: (NOTE) SARS-CoV-2 target nucleic acids are NOT DETECTED.  The SARS-CoV-2 RNA is generally detectable in upper respiratory specimens during the acute phase of infection. The lowest concentration of SARS-CoV-2 viral copies this assay can detect is 138 copies/mL. A negative result does not preclude SARS-Cov-2 infection and should not be used as the sole basis for treatment or other patient management decisions. A negative result may occur with  improper specimen collection/handling, submission of specimen other than nasopharyngeal swab, presence of viral mutation(s) within the areas targeted by this assay, and inadequate number of viral copies(<138 copies/mL). A negative result must be combined with clinical observations, patient history, and epidemiological information. The expected result is Negative.  Fact Sheet for Patients:  BloggerCourse.com  Fact Sheet for Healthcare Providers:  SeriousBroker.it  This test is no t yet approved or cleared by the [place] FDA and  has been authorized for detection and/or diagnosis of SARS-CoV-2 by FDA under an Emergency Use Authorization (EUA). This EUA will remain  in effect (meaning this test can be used) for the duration of the COVID-19 declaration under Section 564(b)(1) of the Act, 21 U.S.C.section 360bbb-3(b)(1), unless the authorization is terminated  or revoked sooner.       Influenza A by PCR NEGATIVE NEGATIVE Final   Influenza B by PCR NEGATIVE NEGATIVE Final    Comment: (NOTE) The Xpert Xpress SARS-CoV-2/FLU/RSV plus assay is intended as an aid in the diagnosis of influenza from Nasopharyngeal swab specimens and should not be used as a sole basis for treatment. Nasal washings  and aspirates are unacceptable for Xpert Xpress SARS-CoV-2/FLU/RSV testing.  Fact Sheet for Patients: BloggerCourse.com  Fact Sheet for Healthcare Providers: SeriousBroker.it  This test is not yet approved or cleared by the [place] FDA and has been authorized for detection and/or diagnosis of SARS-CoV-2 by FDA under an Emergency Use Authorization (EUA). This EUA will remain in effect (meaning this test can be used) for the duration of the COVID-19 declaration under Section 564(b)(1) of the Act, 21 U.S.C. section 360bbb-3(b)(1), unless the authorization is terminated or revoked.     Resp Syncytial Virus by PCR NEGATIVE NEGATIVE Final    Comment: (NOTE) Fact Sheet for Patients: BloggerCourse.com  Fact Sheet for Healthcare Providers: SeriousBroker.it  This test is not yet approved or cleared  by the [place] and has been authorized for detection and/or diagnosis of SARS-CoV-2 by FDA under an Emergency Use Authorization (EUA). This EUA will remain in effect (meaning this test can be used) for the duration of the COVID-19 declaration under Section 564(b)(1) of the Act, 21 U.S.C. section 360bbb-3(b)(1), unless the authorization is terminated or revoked.  Performed at [facility], [address] [place], [place] [address]   Respiratory (~20 pathogens) panel by PCR     Status: None   Collection Time: 10/01/23 12:17 PM   Specimen: Nasopharyngeal Swab; Respiratory  Result Value Ref Range Status   Adenovirus NOT DETECTED NOT DETECTED Final   Coronavirus 229E NOT DETECTED NOT DETECTED Final    Comment: (NOTE) The Coronavirus on the Respiratory Panel, DOES NOT test for the novel  Coronavirus (2019 nCoV)    Coronavirus HKU1 NOT DETECTED NOT DETECTED Final   Coronavirus NL63 NOT DETECTED NOT DETECTED Final   Coronavirus OC43 NOT DETECTED NOT DETECTED Final    Metapneumovirus NOT DETECTED NOT DETECTED Final   Rhinovirus / Enterovirus NOT DETECTED NOT DETECTED Final   Influenza A NOT DETECTED NOT DETECTED Final   Influenza B NOT DETECTED NOT DETECTED Final   Parainfluenza Virus 1 NOT DETECTED NOT DETECTED Final   Parainfluenza Virus 2 NOT DETECTED NOT DETECTED Final   Parainfluenza Virus 3 NOT DETECTED NOT DETECTED Final   Parainfluenza Virus 4 NOT DETECTED NOT DETECTED Final   Respiratory Syncytial Virus NOT DETECTED NOT DETECTED Final   Bordetella pertussis NOT DETECTED NOT DETECTED Final   Bordetella Parapertussis NOT DETECTED NOT DETECTED Final   Chlamydophila pneumoniae NOT DETECTED NOT DETECTED Final   Mycoplasma pneumoniae NOT DETECTED NOT DETECTED Final    Comment: Performed at [facility], [address], [place], [place] [address]    Labs: CBC: Recent Labs  Lab 10/01/23 0925 10/04/23 0418  WBC 9.9 9.6  NEUTROABS 5.2  --   HGB 13.6 12.3  HCT 42.3 37.3  MCV 95.5 92.6  PLT 226 185   Basic Metabolic Panel: Recent Labs  Lab 10/01/23 0925 10/02/23 0426  NA 136 135  K 4.1 4.3  CL 97* 94*  CO2 27 25  GLUCOSE 257* 295*  BUN 15 16  CREATININE 0.88 0.92  CALCIUM 9.3 9.4   Liver Function Tests: Recent Labs  Lab 10/01/23 0925  AST 36  ALT 38  ALKPHOS 52  BILITOT 0.9  PROT 6.9  ALBUMIN 3.8   CBG: Recent Labs  Lab 10/02/23 1633 10/02/23 2121 10/03/23 0809 10/03/23 1156 10/03/23 2026  GLUCAP 219* 194* 137* 251* 230*    Discharge time spent: greater than 30 minutes.  This record has been created using Conservation officer, historic buildings. Errors have been sought and corrected,but may not always be located. Such creation errors do not reflect on the standard of care.   Signed: Arnetha Courser, MD [facility] 10/04/2023

## 2023-10-04 NOTE — TOC Transition Note (Signed)
Transition of Care [facility]) - Discharge Note   Patient Details  Name: Madison [name] MRN: [id number] Date of Birth: 01-01-1951  [facility] CM/SW Contact:  Luvenia Redden, RN Phone Number: 10/04/2023, 9:53 AM   Clinical Narrative:     TOC RNCM attempted outreach calls to both daughters and pt to arrange the requested portable home O2 however unsuccessful with outreach. Able to lvm with the daughter requesting a call back to make such arrangement.   Will make a referral once RNCM confirms agency of choice for the requested portable.         Patient Goals and CMS Choice            Discharge Placement                       Discharge Plan and Services Additional resources added to the After Visit Summary for                                       Social Drivers of Health (SDOH) Interventions SDOH Screenings   Food Insecurity: No Food Insecurity (10/02/2023)  Housing: Low Risk  (10/02/2023)  Transportation Needs: No Transportation Needs (10/02/2023)  Utilities: Not At Risk (10/02/2023)  Financial Resource Strain: Low Risk  (11/26/2021)   Received from [facility], [facility]  Social Connections: Socially Isolated (10/02/2023)  Tobacco Use: Medium Risk (10/01/2023)     Readmission Risk Interventions     No data to display

## 2023-10-05 LAB — GLUCOSE, CAPILLARY
Glucose-Capillary: 202 mg/dL — ABNORMAL HIGH (ref 70–99)
Glucose-Capillary: 134 mg/dL — ABNORMAL HIGH (ref 70–99)
Glucose-Capillary: 123 mg/dL — ABNORMAL HIGH (ref 70–99)
Glucose-Capillary: 308 mg/dL — ABNORMAL HIGH (ref 70–99)

## 2024-03-16 ENCOUNTER — Emergency Department: Payer: Self-pay

## 2024-03-16 ENCOUNTER — Emergency Department
Admission: EM | Admit: 2024-03-16 | Discharge: 2024-03-16 | Disposition: A | Discharge status: Home / Self Care | Payer: Self-pay | Source: Ambulatory Visit | Attending: Emergency Medicine | Admitting: Emergency Medicine

## 2024-03-16 ENCOUNTER — Other Ambulatory Visit: Payer: Self-pay

## 2024-03-16 DIAGNOSIS — L03115 Cellulitis of right lower limb: Secondary | ICD-10-CM | POA: Insufficient documentation

## 2024-03-16 LAB — COMPREHENSIVE METABOLIC PANEL WITH GFR
ALT: 36 U/L (ref 0–44)
AST: 30 U/L (ref 15–41)
Albumin: 3.7 g/dL (ref 3.5–5.0)
Alkaline Phosphatase: 69 U/L (ref 38–126)
Anion gap: 11 (ref 5–15)
BUN: 22 mg/dL (ref 8–23)
CO2: 25 mmol/L (ref 22–32)
Calcium: 9.1 mg/dL (ref 8.9–10.3)
Chloride: 102 mmol/L (ref 98–111)
Creatinine, Ser: 0.67 mg/dL (ref 0.44–1.00)
GFR, Estimated: >60 mL/min (ref >60)
Glucose, Bld: 143 mg/dL — ABNORMAL HIGH (ref 70–99)
Potassium: 4 mmol/L (ref 3.5–5.1)
Sodium: 138 mmol/L (ref 135–145)
Total Bilirubin: 0.8 mg/dL (ref 0.0–1.2)
Total Protein: 7.4 g/dL (ref 6.5–8.1)

## 2024-03-16 LAB — CBC WITH DIFFERENTIAL/PLATELET
Abs Immature Granulocytes: 0.17 K/uL — ABNORMAL HIGH (ref 0.00–0.07)
Basophils Absolute: 0.1 K/uL (ref 0.0–0.1)
Basophils Relative: 1 %
Eosinophils Absolute: 0.5 K/uL (ref 0.0–0.5)
Eosinophils Relative: 6 %
HCT: 41.8 % (ref 36.0–46.0)
Hemoglobin: 13.6 g/dL (ref 12.0–15.0)
Immature Granulocytes: 2 %
Lymphocytes Relative: 21 %
Lymphs Abs: 2 K/uL (ref 0.7–4.0)
MCH: 30.6 pg (ref 26.0–34.0)
MCHC: 32.5 g/dL (ref 30.0–36.0)
MCV: 94.1 fL (ref 80.0–100.0)
Monocytes Absolute: 0.7 K/uL (ref 0.1–1.0)
Monocytes Relative: 7 %
Neutro Abs: 6 K/uL (ref 1.7–7.7)
Neutrophils Relative %: 63 %
Platelets: 245 K/uL (ref 150–400)
RBC: 4.44 MIL/uL (ref 3.87–5.11)
RDW: 13.8 % (ref 11.5–15.5)
WBC: 9.4 K/uL (ref 4.0–10.5)
nRBC: 0 % (ref 0.0–0.2)

## 2024-03-16 LAB — LACTIC ACID, PLASMA
Lactic Acid, Venous: 1.3 mmol/L (ref 0.5–1.9)
Lactic Acid, Venous: 1.3 mmol/L (ref 0.5–1.9)

## 2024-03-16 MED ORDER — CEPHALEXIN 500 MG PO CAPS: 500.0000 mg | ORAL_CAPSULE | Freq: Three times a day (TID) | ORAL | 0 refills | Status: AC

## 2024-03-16 MED ORDER — VANCOMYCIN HCL IN DEXTROSE 1-5 GM/200ML-% IV SOLN
1000.0000 mg | Freq: Once | INTRAVENOUS | Status: AC
  Administered 2024-03-16: 1000 mg via INTRAVENOUS
  Filled 2024-03-16: qty 200

## 2024-03-16 MED ORDER — PREDNISONE 10 MG (21) PO TBPK: ORAL_TABLET | ORAL | 0 refills | Status: AC

## 2024-03-16 NOTE — ED Notes (Signed)
#  1st set of blood cultures sent to lab.

## 2024-03-16 NOTE — ED Triage Notes (Signed)
 Patient sent over from PCP for RLE Cellulitis; patient reports erythema and warmth to RLE x 5 days.

## 2024-03-16 NOTE — ED Provider Notes (Signed)
 [facility] Provider Note    Event Date/Time   First MD Initiated Contact with Patient 03/16/24 TRENNA     (approximate)   History   Wound Infection   HPI  Madison [name] is a 73 y.o. female  who presents to the emergency department today because of concern for possible cellulitis of the right leg. The patient hit her right great toe against a piece of furniture 5 days ago which resulted in a small amount of bleeding around the nail.  The next day she started noticing some redness developing.  She did try putting antibiotic ointment over that area.  However since then the redness has confounded.  She has had some swelling.  It is started to go up her leg.  She went to see her primary care doctor today who was concerned for rapidly progressing cellulitis so advised patient to seek care at the emergency department. Patient denies any fevers, nausea or vomiting.       Physical Exam   Triage Vital Signs: ED Triage Vitals  Encounter Vitals Group     BP 03/16/24 1427 123/75     Girls Systolic BP Percentile --      Girls Diastolic BP Percentile --      Boys Systolic BP Percentile --      Boys Diastolic BP Percentile --      Pulse Rate 03/16/24 1424 72     Resp 03/16/24 1424 17     Temp 03/16/24 1424 98.1 F (36.7 C)     Temp Source 03/16/24 1424 Oral     SpO2 03/16/24 1427 98 %     Weight 03/16/24 1427 231 lb (104.8 kg)     Height 03/16/24 1427 4' 11 (1.499 m)     Head Circumference --      Peak Flow --      Pain Score 03/16/24 1427 8     Pain Loc --      Pain Education --      Exclude from Growth Chart --     Most recent vital signs: Vitals:   03/16/24 1427 03/16/24 1815  BP: 123/75 100/72  Pulse:  71  Resp:  14  Temp:    SpO2: 98% 100%   General: Awake, alert, oriented. CV:  Good peripheral perfusion.  Resp:  Normal effort.  Abd:  No distention.  Other:  Right foot with swelling, erythema, warmth, does extend up to proximal shin. Mild  tenderness.   ED Results / Procedures / Treatments   Labs (all labs ordered are listed, but only abnormal results are displayed) Labs Reviewed  COMPREHENSIVE METABOLIC PANEL WITH GFR - Abnormal; Notable for the following components:      Result Value   Glucose, Bld 143 (*)    All other components within normal limits  CBC WITH DIFFERENTIAL/PLATELET - Abnormal; Notable for the following components:   Abs Immature Granulocytes 0.17 (*)    All other components within normal limits  LACTIC ACID, PLASMA  LACTIC ACID, PLASMA  URINALYSIS, W/ REFLEX TO CULTURE (INFECTION SUSPECTED)     EKG  None   RADIOLOGY I independently interpreted and visualized the CXR. My interpretation: No pneumonia Radiology interpretation:  IMPRESSION:  1. No active disease. Linear scarring or atelectasis at the bases.  2. Some right perihilar convex fullness compared to prior, probably  secondary to vascular summation, recommend short interval dedicated  two view chest radiograph follow-up   I independently interpreted and visualized the  right foot. My interpretation: No osseous abnormality Radiology interpretation: IMPRESSION:  1. No acute bony abnormalities. Degenerative changes in the right  foot.  2. Soft tissue swelling over the dorsum of the foot compatible with  cellulitis.     PROCEDURES:  Critical Care performed: No   MEDICATIONS ORDERED IN ED: Medications - No data to display   IMPRESSION / MDM / ASSESSMENT AND PLAN / ED COURSE  I reviewed the triage vital signs and the nursing notes.                              Differential diagnosis includes, but is not limited to, cellulitis, osteomyelitis  Patient's presentation is most consistent with acute presentation with potential threat to life or bodily function.   Patient presented to the emergency department today because of concerns for right leg cellulitis.  On exam patient's right leg is swollen.  Does have erythema and  warmth. X-ray of the foot was obtained which did not show any signs of osteomyelitis.  Patient was given dose of IV antibiotics here.  I did offer admission for further IV antibiotics however I do think would be reasonable for patient was discharged home given lack of fever or leukocytosis.  At this time patient would like to try being discharged with oral antibiotics.  She does understand to return for any worsening.  Also give patient dose of steroids to help with the erythema and comfort.  I did discuss this with the patient.  She is aware of potential for hyperglycemia so she will continue to monitor.      FINAL CLINICAL IMPRESSION(S) / ED DIAGNOSES   Final diagnoses:  Cellulitis of right lower extremity     Note:  This document was prepared using Dragon voice recognition software and may include unintentional dictation errors.    Floy Roberts, MD 03/16/24 2017

## 2024-03-16 NOTE — Progress Notes (Signed)
 Chief Complaint  Patient presents with  . COPD  . Cellulitis    Onset Sat.  // hit toe on cedar box.    Subjective  Madison [name] is a 73 y.o. female who presents for COPD and Cellulitis (Onset Sat.  // hit toe on cedar box.) HPI History of Present Illness Madison [name] is a 73 year old female who presents with worsening redness and bleeding of the right leg.  She injured her right foot while checking her yarn supply, resulting in bleeding. She applied triple antibiotic ointment and a Band-Aid to the affected area. Redness began at the toe and progressed up the ankle and continues to go up the right leg over the past few days, indicating a rapid progression of symptoms. She believes the injury occurred on Friday or Saturday.  She has a history of a callus on her right foot, which she has been treating with bag balm.  Her COPD symptoms have been exacerbated by the rain.  She has a history of atrial fibrillation, currently on apixaban .  Has a history of coronary arterial disease, systolic congestive heart failure, venous insufficiency with a history of leg ulceration.  History of diabetes with an A1c 6 months ago of 9.5% and most recently 7.9% 3 months ago.  No chest pain, shortness of breath, or palpitations. She reports that her blood pressure was good, but she did not check her temperature.  Review of Systems  Patient Active Problem List  Diagnosis  . Hypercholesteremia, on statin  . Hay fever, on Flonase   . Tobacco Use  . 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 Chronic leg pain, right leg--sciatica  . CTS, Surgery L 2005  . Tests: Nuclear ETT 2009 WNL, Echo 2008 WNL, BMD 2009-Avg for age, Lumbar MRI 2009, PFTs 2015 [facility]  . Specialist: Pain, [facility], [facility]  . Varicose veins, s/p stripping on R  . Leg edema, venous stasis  . Abdominal wall hematoma, 2-15 [facility] ER  . Obesity, Class III, BMI 40-49.9 (morbid obesity)  . Sinus  tachycardia  . h/o Acute respiratory failure with pneumonia  . Degeneration of lumbar or lumbosacral intervertebral disc  . Cervical spondylosis without myelopathy  . Acute low back pain with radicular symptoms, duration less than 6 weeks  . Spondylolisthesis of lumbar region  . Essential hypertension  . Elevated troponin  . Sinusitis, unspecified  . Coronary artery calcification seen on CT scan  . Pneumonia due to methicillin resistant Staphylococcus aureus (CMS/HHS-HCC)  . At risk for delirium  . DM (diabetes mellitus), type 2 (CMS/HHS-HCC)  . Paroxysmal atrial fibrillation (CMS/HHS-HCC)  . COPD exacerbation (CMS/HHS-HCC)  . Acute on chronic diastolic CHF (congestive heart failure) (CMS/HHS-HCC)  . Acute on chronic systolic congestive heart failure (CMS/HHS-HCC)  . Acute on chronic respiratory failure with hypoxia and hypercapnia (CMS/HHS-HCC)  . Chronic anticoagulation  . GERD (gastroesophageal reflux disease)  . Hypercapnia  . Cellulitis of lower extremity  . Hypokalemia  . Pneumonia due to infectious organism    Outpatient Medications Prior to Visit  Medication Sig Dispense Refill  . acetaminophen  (TYLENOL ) 500 MG tablet Take 1,000 mg by mouth as needed for Pain    . acetylcysteine  (MUCOMYST ) 20 % solution for inhalation Inhale into the lungs    . albuterol  (PROVENTIL ) 2.5 mg /3 mL (0.083 %) nebulizer solution Take 3 mLs (2.5 mg total) by nebulization every 6 (six) hours as needed for Wheezing or Shortness of Breath for up  to 90 days 360 mL 2  . albuterol  MDI, PROVENTIL , VENTOLIN , PROAIR , HFA 90 mcg/actuation inhaler Inhale 2 inhalations into the lungs every 4 (four) hours as needed for Wheezing or Shortness of Breath 1 each 11  . apixaban  (ELIQUIS ) 5 mg tablet Take 1 tablet (5 mg total) by mouth every 12 (twelve) hours 180 tablet 3  . arformoteroL  (BROVANA ) 15 mcg/2 mL nebulizer solution Take 2 mLs (15 mcg total) by nebulization 2 (two) times daily for 90 days 360 mL 0  .  atorvastatin  (LIPITOR) 20 MG tablet Take 1 tablet (20 mg total) by mouth once daily 90 tablet 3  . blood glucose diagnostic (PRECISION XTRA TEST) test strip 1 each    . blood-glucose sensor (FREESTYLE LIBRE 3 PLUS SENSOR) Devi Use 1 each every 15 (fifteen) days 2 each 11  . cholecalciferol  (VITAMIN D3) 1000 unit capsule Take 1,000 Units by mouth once daily    . cyanocobalamin  (VITAMIN B12) 500 MCG tablet Take 500 mcg by mouth daily.    SABRA docusate (COLACE) 100 MG capsule Take 100 mg by mouth as needed       . empagliflozin (JARDIANCE) 25 mg tablet Take 1 tablet (25 mg total) by mouth once daily 90 tablet 3  . famotidine  (PEPCID ) 20 MG tablet Take 20 mg by mouth nightly    . fluticasone  propionate (FLONASE ) 50 mcg/actuation nasal spray 2 sprays by Nasal route once daily       . FUROsemide  (LASIX ) 20 MG tablet May use if excessive weight gain to supplement 40 mg tablets. 30 tablet 11  . FUROsemide  (LASIX ) 40 MG tablet TAKE 1 TABLET(40 MG) BY MOUTH DAILY 30 tablet 11  . glucagon  1 mg injection Inject 1 mg subcutaneously    . guaiFENesin  (MUCINEX ) 600 mg SR tablet Take 600 mg by mouth every 12 (twelve) hours as needed for Cough    . insulin  DEGLUDEC (TRESIBA FLEXTOUCH U-100) pen injector (concentration 100 units/mL) Inject 42 Units subcutaneously at bedtime 12 mL 3  . ipratropium-albuteroL  (DUO-NEB) nebulizer solution USE 3 ML VIA NEBULIZER FOUR TIMES DAILY 360 mL 2  . lancets (2-IN-1 LANCET DEVICE MISC) 1 each    . loratadine -pseudoephedrine  (CLARITIN -D 12-HOUR) 5-120 mg ER tablet Take 1 tablet by mouth 2 (two) times daily    . metFORMIN  (GLUCOPHAGE -XR) 750 MG XR tablet TAKE 1 TABLET(750 MG) BY MOUTH DAILY WITH DINNER 100 tablet 2  . methocarbamoL  (ROBAXIN ) 750 MG tablet TAKE 1 TABLET(750 MG) BY MOUTH THREE TIMES DAILY AS NEEDED 30 tablet 0  . metoprolol  TARTrate (LOPRESSOR ) 50 MG tablet Take 1.5 tablets (75 mg total) by mouth 2 (two) times daily 90 tablet 3  . metoprolol  TARTrate 75 mg Tab Take 50  mg by mouth 2 (two) times daily 60 tablet 0  . montelukast  (SINGULAIR ) 10 mg tablet TAKE 1 TABLET(10 MG) BY MOUTH DAILY 30 tablet 5  . nebulizer accessories Misc Use 1 each every 6 (six) hours as needed (shortness of breath or wheezing) Provide patient with nebulizer accessories 1 each 0  . olopatadine  (PATADAY ) 0.2 % ophthalmic solution Place 1 drop into both eyes once daily. 2.5 mL prn  . pen needle, diabetic 32 gauge x 5/16 needle Use as directed 100 each 3  . potassium chloride  (KLOR-CON  M20) 20 MEQ ER tablet Take 1 tablet (20 mEq total) by mouth once daily 90 tablet 3   No facility-administered medications prior to visit.      Objective  Vitals:   03/16/24 1302  BP:  130/64  Pulse: 78  SpO2: 98%  Weight: (!) 104.8 kg (231 lb)  Height: 147.3 cm (4' 10)   Body mass index is 48.28 kg/m.  Home Vitals:     Physical Exam Physical Exam EXTREMITIES: Redness and swelling from the toe extending up the leg.  Constitutional: alert, obese, acute distress, and communicates well Eye exam: sclera anicteric. Neck: supple and no thyroid enlargement or cervical adenopathy Respiratory: breath sounds decreased throughout  Cardiovascular: regular rate and rhythm and without murmurs, rubs or gallops Lower extremities: lower extremity edema moderate Skin ankles/feet: Skin of the right foot and leg with erythema, edema, signs of cellulitis   Results          Assessment/Plan:   Assessment & Plan Cellulitis Cellulitis of the right foot and right leg. The condition has been worsening over the past few days, with erythema spreading from the toe up the leg. She has been applying triple antibiotic ointment and changing bandages, but the infection appears to be progressing rapidly. - Refer to the emergency department for IV fluids and IV antibiotics.  Chronic Obstructive Pulmonary Disease (COPD) exacerbation COPD exacerbation likely triggered by recent weather changes, specifically  rain, with increased symptoms consistent with an exacerbation.  Uncontrolled type 2 diabetes mellitus Until recently the patient A1c was very high above 9%, most recent A1c 3 months ago was 7.9%  Complexity of care The symptoms that the patient presents represents a challenge in managing.  The risk of morbidity and mortality are high considering her right leg cellulitis starting to go up her lower extremity.  Patient transferred to the emergency room for further care management.. The decision-making component of this visit is very complex.  There are no diagnoses linked to this encounter.      This visit was coded based on medical decision making (MDM).           Future Appointments     Date/Time Provider Department Center Visit Type   04/04/2024 1:30 PM (Arrive by 1:15 PM) Liane Sharyne Abbey, CPP [facility] MEDICINE CHECK       There are no Patient Instructions on file for this visit.  An after visit summary was provided for the patient either in written format (printed) or through [facility].  This note has been created using automated tools and reviewed for accuracy by MARIO E OLMEDO.

## 2024-03-16 NOTE — ED Notes (Signed)
2nd set of BC sent to lab.
